# Patient Record
Sex: Female | Born: 1980 | Race: Black or African American | Hispanic: No | Marital: Single | Smoking: Former smoker
Health system: Southern US, Community
[De-identification: ages and names within clinical notes are randomized; demographics above are authoritative.]

## PROBLEM LIST (undated history)

## (undated) ENCOUNTER — Inpatient Hospital Stay (HOSPITAL_COMMUNITY): Payer: Self-pay

## (undated) DIAGNOSIS — F3181 Bipolar II disorder: Secondary | ICD-10-CM

## (undated) DIAGNOSIS — F32A Depression, unspecified: Secondary | ICD-10-CM

## (undated) DIAGNOSIS — F329 Major depressive disorder, single episode, unspecified: Secondary | ICD-10-CM

## (undated) DIAGNOSIS — K589 Irritable bowel syndrome without diarrhea: Secondary | ICD-10-CM

## (undated) DIAGNOSIS — I10 Essential (primary) hypertension: Secondary | ICD-10-CM

## (undated) HISTORY — DX: Bipolar II disorder: F31.81

## (undated) HISTORY — PX: CHOLECYSTECTOMY: SHX55

## (undated) HISTORY — DX: Irritable bowel syndrome, unspecified: K58.9

## (undated) HISTORY — PX: MOUTH SURGERY: SHX715

---
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
## 2002-10-22 ENCOUNTER — Emergency Department (HOSPITAL_COMMUNITY): Admission: EM | Admit: 2002-10-22 | Discharge: 2002-10-22 | Payer: Self-pay | Admitting: Emergency Medicine

## 2002-11-22 ENCOUNTER — Emergency Department (HOSPITAL_COMMUNITY): Admission: EM | Admit: 2002-11-22 | Discharge: 2002-11-22 | Payer: Self-pay | Admitting: Emergency Medicine

## 2003-01-30 ENCOUNTER — Emergency Department (HOSPITAL_COMMUNITY): Admission: EM | Admit: 2003-01-30 | Discharge: 2003-01-30 | Payer: Self-pay | Admitting: Emergency Medicine

## 2003-02-14 ENCOUNTER — Emergency Department (HOSPITAL_COMMUNITY): Admission: EM | Admit: 2003-02-14 | Discharge: 2003-02-14 | Payer: Self-pay | Admitting: Emergency Medicine

## 2004-02-22 ENCOUNTER — Emergency Department (HOSPITAL_COMMUNITY): Admission: EM | Admit: 2004-02-22 | Discharge: 2004-02-22 | Payer: Self-pay | Admitting: Family Medicine

## 2004-03-04 ENCOUNTER — Emergency Department (HOSPITAL_COMMUNITY): Admission: EM | Admit: 2004-03-04 | Discharge: 2004-03-04 | Payer: Self-pay | Admitting: Family Medicine

## 2004-03-07 ENCOUNTER — Emergency Department (HOSPITAL_COMMUNITY): Admission: EM | Admit: 2004-03-07 | Discharge: 2004-03-07 | Payer: Self-pay | Admitting: Family Medicine

## 2005-02-04 ENCOUNTER — Emergency Department (HOSPITAL_COMMUNITY): Admission: EM | Admit: 2005-02-04 | Discharge: 2005-02-04 | Payer: Self-pay | Admitting: Emergency Medicine

## 2005-02-06 ENCOUNTER — Emergency Department (HOSPITAL_COMMUNITY): Admission: EM | Admit: 2005-02-06 | Discharge: 2005-02-06 | Payer: Self-pay | Admitting: Family Medicine

## 2005-02-16 ENCOUNTER — Emergency Department (HOSPITAL_COMMUNITY): Admission: EM | Admit: 2005-02-16 | Discharge: 2005-02-16 | Payer: Self-pay | Admitting: Family Medicine

## 2005-02-19 ENCOUNTER — Emergency Department (HOSPITAL_COMMUNITY): Admission: EM | Admit: 2005-02-19 | Discharge: 2005-02-19 | Payer: Self-pay | Admitting: Family Medicine

## 2005-04-05 ENCOUNTER — Emergency Department (HOSPITAL_COMMUNITY): Admission: EM | Admit: 2005-04-05 | Discharge: 2005-04-05 | Payer: Self-pay | Admitting: Emergency Medicine

## 2005-07-21 ENCOUNTER — Emergency Department (HOSPITAL_COMMUNITY): Admission: EM | Admit: 2005-07-21 | Discharge: 2005-07-21 | Payer: Self-pay | Admitting: Internal Medicine

## 2006-05-21 ENCOUNTER — Inpatient Hospital Stay (HOSPITAL_COMMUNITY): Admission: AD | Admit: 2006-05-21 | Discharge: 2006-05-21 | Payer: Self-pay | Admitting: Gynecology

## 2006-07-02 ENCOUNTER — Inpatient Hospital Stay (HOSPITAL_COMMUNITY): Admission: AD | Admit: 2006-07-02 | Discharge: 2006-07-02 | Payer: Self-pay | Admitting: Obstetrics and Gynecology

## 2006-12-30 ENCOUNTER — Inpatient Hospital Stay (HOSPITAL_COMMUNITY): Admission: AD | Admit: 2006-12-30 | Discharge: 2006-12-30 | Payer: Self-pay | Admitting: Obstetrics and Gynecology

## 2007-01-01 ENCOUNTER — Inpatient Hospital Stay (HOSPITAL_COMMUNITY): Admission: AD | Admit: 2007-01-01 | Discharge: 2007-01-03 | Payer: Self-pay | Admitting: Obstetrics and Gynecology

## 2007-05-28 ENCOUNTER — Emergency Department (HOSPITAL_COMMUNITY): Admission: EM | Admit: 2007-05-28 | Discharge: 2007-05-28 | Payer: Self-pay | Admitting: Emergency Medicine

## 2008-08-16 ENCOUNTER — Encounter: Admission: RE | Admit: 2008-08-16 | Discharge: 2008-08-16 | Payer: Self-pay | Admitting: Family Medicine

## 2010-07-31 ENCOUNTER — Emergency Department (HOSPITAL_COMMUNITY): Admission: EM | Admit: 2010-07-31 | Discharge: 2010-07-31 | Payer: Self-pay | Admitting: Family Medicine

## 2010-10-13 ENCOUNTER — Emergency Department (HOSPITAL_COMMUNITY)
Admission: EM | Admit: 2010-10-13 | Discharge: 2010-10-13 | Payer: Self-pay | Source: Home / Self Care | Admitting: Emergency Medicine

## 2010-11-17 ENCOUNTER — Encounter: Payer: Self-pay | Admitting: Gynecology

## 2010-12-01 ENCOUNTER — Emergency Department (HOSPITAL_COMMUNITY)
Admission: EM | Admit: 2010-12-01 | Discharge: 2010-12-01 | Disposition: A | Discharge status: Home / Self Care | Payer: Medicaid Other | Attending: Emergency Medicine | Admitting: Emergency Medicine

## 2010-12-01 DIAGNOSIS — R109 Unspecified abdominal pain: Secondary | ICD-10-CM | POA: Insufficient documentation

## 2010-12-01 DIAGNOSIS — R10812 Left upper quadrant abdominal tenderness: Secondary | ICD-10-CM | POA: Insufficient documentation

## 2010-12-01 DIAGNOSIS — R112 Nausea with vomiting, unspecified: Secondary | ICD-10-CM | POA: Insufficient documentation

## 2010-12-01 DIAGNOSIS — R197 Diarrhea, unspecified: Secondary | ICD-10-CM | POA: Insufficient documentation

## 2010-12-01 LAB — DIFFERENTIAL
Basophils Relative: 0 % (ref 0–1)
Eosinophils Absolute: 0.1 10*3/uL (ref 0.0–0.7)
Lymphocytes Relative: 10 % — ABNORMAL LOW (ref 12–46)
Lymphs Abs: 0.9 10*3/uL (ref 0.7–4.0)
Monocytes Absolute: 0.7 10*3/uL (ref 0.1–1.0)
Neutro Abs: 7.7 10*3/uL (ref 1.7–7.7)
Neutrophils Relative %: 82 % — ABNORMAL HIGH (ref 43–77)

## 2010-12-01 LAB — COMPREHENSIVE METABOLIC PANEL
AST: 23 U/L (ref 0–37)
BUN: 10 mg/dL (ref 6–23)
Calcium: 9 mg/dL (ref 8.4–10.5)
Sodium: 137 mEq/L (ref 135–145)

## 2010-12-01 LAB — URINALYSIS, ROUTINE W REFLEX MICROSCOPIC
Bilirubin Urine: NEGATIVE
Ketones, ur: NEGATIVE mg/dL
Protein, ur: NEGATIVE mg/dL
Urine Glucose, Fasting: NEGATIVE mg/dL
Urobilinogen, UA: 1 mg/dL (ref 0.0–1.0)

## 2010-12-01 LAB — CBC
Hemoglobin: 15.4 g/dL — ABNORMAL HIGH (ref 12.0–15.0)
MCH: 26.4 pg (ref 26.0–34.0)
MCHC: 36.2 g/dL — ABNORMAL HIGH (ref 30.0–36.0)
MCV: 73.1 fL — ABNORMAL LOW (ref 78.0–100.0)
Platelets: 192 10*3/uL (ref 150–400)
RBC: 5.83 MIL/uL — ABNORMAL HIGH (ref 3.87–5.11)
RDW: 13.4 % (ref 11.5–15.5)

## 2010-12-01 LAB — POCT I-STAT, CHEM 8
Calcium, Ion: 1.14 mmol/L (ref 1.12–1.32)
Chloride: 102 mEq/L (ref 96–112)
Glucose, Bld: 85 mg/dL (ref 70–99)

## 2010-12-01 LAB — URINE MICROSCOPIC-ADD ON

## 2011-06-25 ENCOUNTER — Inpatient Hospital Stay (INDEPENDENT_AMBULATORY_CARE_PROVIDER_SITE_OTHER)
Admission: RE | Admit: 2011-06-25 | Discharge: 2011-06-25 | Disposition: A | Discharge status: Home / Self Care | Payer: Self-pay | Source: Ambulatory Visit | Attending: Emergency Medicine | Admitting: Emergency Medicine

## 2011-06-25 DIAGNOSIS — J069 Acute upper respiratory infection, unspecified: Secondary | ICD-10-CM

## 2011-06-25 DIAGNOSIS — J40 Bronchitis, not specified as acute or chronic: Secondary | ICD-10-CM

## 2011-06-25 DIAGNOSIS — J019 Acute sinusitis, unspecified: Secondary | ICD-10-CM

## 2011-08-11 LAB — I-STAT 8, (EC8 V) (CONVERTED LAB)
Chloride: 105
Glucose, Bld: 83
Potassium: 4
TCO2: 28
pCO2, Ven: 55.3 — ABNORMAL HIGH
pH, Ven: 7.283

## 2011-08-11 LAB — DIFFERENTIAL
Basophils Absolute: 0
Basophils Relative: 0
Eosinophils Absolute: 0.3
Lymphocytes Relative: 39
Monocytes Relative: 8
Neutro Abs: 3.3
Neutrophils Relative %: 48

## 2011-08-11 LAB — CBC
Hemoglobin: 13.5
MCHC: 33.8
MCV: 74.3 — ABNORMAL LOW
RBC: 5.38 — ABNORMAL HIGH

## 2011-08-11 LAB — D-DIMER, QUANTITATIVE: D-Dimer, Quant: 0.51 — ABNORMAL HIGH

## 2011-08-11 LAB — HEPATIC FUNCTION PANEL
Alkaline Phosphatase: 55
Bilirubin, Direct: <0.1

## 2011-08-11 LAB — LIPASE, BLOOD: Lipase: 15

## 2011-08-11 LAB — POCT I-STAT CREATININE: Operator id: 234501

## 2012-01-28 ENCOUNTER — Encounter (HOSPITAL_COMMUNITY): Payer: Self-pay | Admitting: *Deleted

## 2012-01-28 ENCOUNTER — Emergency Department (HOSPITAL_COMMUNITY)
Admission: EM | Admit: 2012-01-28 | Discharge: 2012-01-29 | Disposition: A | Discharge status: Home / Self Care | Payer: Medicaid Other | Attending: Emergency Medicine | Admitting: Emergency Medicine

## 2012-01-28 DIAGNOSIS — F1010 Alcohol abuse, uncomplicated: Secondary | ICD-10-CM | POA: Insufficient documentation

## 2012-01-28 DIAGNOSIS — F3289 Other specified depressive episodes: Secondary | ICD-10-CM | POA: Insufficient documentation

## 2012-01-28 DIAGNOSIS — F329 Major depressive disorder, single episode, unspecified: Secondary | ICD-10-CM | POA: Insufficient documentation

## 2012-01-28 DIAGNOSIS — R45851 Suicidal ideations: Secondary | ICD-10-CM | POA: Insufficient documentation

## 2012-01-28 DIAGNOSIS — F17200 Nicotine dependence, unspecified, uncomplicated: Secondary | ICD-10-CM | POA: Insufficient documentation

## 2012-01-28 HISTORY — DX: Major depressive disorder, single episode, unspecified: F32.9

## 2012-01-28 HISTORY — DX: Depression, unspecified: F32.A

## 2012-01-28 LAB — CBC
Hemoglobin: 14.6 g/dL (ref 12.0–15.0)
Platelets: 246 10*3/uL (ref 150–400)
RDW: 13.1 % (ref 11.5–15.5)

## 2012-01-28 LAB — ETHANOL: Alcohol, Ethyl (B): <11 mg/dL (ref 0–11)

## 2012-01-28 LAB — COMPREHENSIVE METABOLIC PANEL
ALT: 16 U/L (ref 0–35)
AST: 19 U/L (ref 0–37)
BUN: 6 mg/dL (ref 6–23)
CO2: 28 mEq/L (ref 19–32)
Chloride: 102 mEq/L (ref 96–112)
GFR calc Af Amer: >90 mL/min (ref >90)
Glucose, Bld: 107 mg/dL — ABNORMAL HIGH (ref 70–99)

## 2012-01-28 LAB — RAPID URINE DRUG SCREEN, HOSP PERFORMED
Amphetamines: NOT DETECTED
Benzodiazepines: NOT DETECTED
Opiates: NOT DETECTED

## 2012-01-28 MED ORDER — IBUPROFEN 600 MG PO TABS
600.0000 mg | ORAL_TABLET | Freq: Three times a day (TID) | ORAL | Status: DC | PRN
  Administered 2012-01-28: 600 mg via ORAL
  Filled 2012-01-28: qty 1

## 2012-01-28 MED ORDER — LORAZEPAM 1 MG PO TABS: 1.0000 mg | ORAL_TABLET | Freq: Three times a day (TID) | ORAL | Status: DC | PRN

## 2012-01-28 MED ORDER — ONDANSETRON HCL 4 MG PO TABS
4.0000 mg | ORAL_TABLET | Freq: Three times a day (TID) | ORAL | Status: DC | PRN
Start: 2012-01-28 — End: 2012-01-29

## 2012-01-28 MED ORDER — ACETAMINOPHEN 325 MG PO TABS: 650.0000 mg | ORAL_TABLET | ORAL | Status: DC | PRN

## 2012-01-28 MED ORDER — ZOLPIDEM TARTRATE 5 MG PO TABS: 5.0000 mg | ORAL_TABLET | Freq: Every evening | ORAL | Status: DC | PRN

## 2012-01-28 MED ORDER — ALUM & MAG HYDROXIDE-SIMETH 200-200-20 MG/5ML PO SUSP: 30.0000 mL | ORAL | Status: DC | PRN

## 2012-01-28 MED ORDER — CITALOPRAM HYDROBROMIDE 20 MG PO TABS: 20.0000 mg | ORAL_TABLET | Freq: Every day | ORAL | Status: DC

## 2012-01-28 NOTE — ED Notes (Signed)
Pt belongings are in locker 4  
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
## 2012-01-28 NOTE — ED Notes (Signed)
Pt. Inventory: 1 pair of socks, 1 chapstick, 1 pair of shoes, 1 pair of jeans, 1 gray shirt, 1 black bra, 1 gray sweatshirt, 1 cell phone, and drivers license.
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
## 2012-01-28 NOTE — ED Provider Notes (Addendum)
History     CSN: [id number]  Arrival date & time 01/28/12  1705   First MD Initiated Contact with Patient 01/28/12 1803      Chief Complaint  Patient presents with  . Depression  . Medical Clearance    (Consider location/radiation/quality/duration/timing/severity/associated sxs/prior treatment) Patient is a 31 y.o. female presenting with mental health disorder. The history is provided by the patient. No language interpreter was used.  Mental Health Problem The primary symptoms do not include dysphoric mood or hallucinations. The current episode started 1 to 2 weeks ago. This is a new problem.  The onset of the illness is precipitated by a stressful event and emotional stress. The degree of incapacity that she is experiencing as a consequence of her illness is moderate. Sequelae of the illness include an inability to work. Additional symptoms of the illness do not include no appetite change, no fatigue, no headaches or no abdominal pain. She admits to suicidal ideas. She does not have a plan to commit suicide. She contemplates harming herself. She has not already injured self. She contemplates injuring another person. She has not already  injured another person. Risk factors that are present for mental illness include a history of mental illness.    Past Medical History  Diagnosis Date  . Depression     Past Surgical History  Procedure Date  . Cholecystectomy     No family history on file.  History  Substance Use Topics  . Smoking status: Current Everyday Smoker  . Smokeless tobacco: Not on file  . Alcohol Use: Yes     "at least a 22 oz beer everyday"    OB History    Grav Para Term Preterm Abortions TAB SAB Ect Mult Living                  Review of Systems  Constitutional: Negative for fever, chills, activity change, appetite change and fatigue.  HENT: Negative for congestion, sore throat, rhinorrhea, neck pain and neck stiffness.   Respiratory: Negative for cough  and shortness of breath.   Cardiovascular: Negative for chest pain and palpitations.  Gastrointestinal: Negative for nausea, vomiting and abdominal pain.  Genitourinary: Negative for dysuria, urgency, frequency and flank pain.  Musculoskeletal: Negative for myalgias, back pain and arthralgias.  Neurological: Negative for dizziness, weakness, light-headedness, numbness and headaches.  Psychiatric/Behavioral: Positive for suicidal ideas. Negative for hallucinations, sleep disturbance and dysphoric mood. The patient is not nervous/anxious.   All other systems reviewed and are negative.    Allergies  Review of patient's allergies indicates no known allergies.  Home Medications   Current Outpatient Rx  Name Route Sig Dispense Refill  . BC HEADACHE POWDER PO Oral Take 1 Package by mouth once.    Marland Kitchen LEVONORGESTREL 20 MCG/24HR IU IUD Intrauterine 1 each by Intrauterine route once.    Marland Kitchen METRONIDAZOLE 0.75 % VA GEL Vaginal Place 1 Applicatorful vaginally 2 (two) times daily.      BP 135/97  Pulse 88  Temp(Src) 98.7 F (37.1 C) (Oral)  Resp 16  SpO2 100%  Physical Exam  Nursing note and vitals reviewed. Constitutional: She is oriented to person, place, and time. She appears well-developed and well-nourished.       tearful  HENT:  Head: Normocephalic and atraumatic.  Mouth/Throat: Oropharynx is clear and moist. No oropharyngeal exudate.  Eyes: Conjunctivae and EOM are normal. Pupils are equal, round, and reactive to light.  Neck: Normal range of motion. Neck supple.  Cardiovascular: Normal  rate, regular rhythm, normal heart sounds and intact distal pulses.  Exam reveals no gallop and no friction rub.   No murmur heard. Pulmonary/Chest: Effort normal and breath sounds normal.  Abdominal: Soft. Bowel sounds are normal. There is no tenderness.  Musculoskeletal: Normal range of motion. She exhibits no tenderness.  Neurological: She is alert and oriented to person, place, and time. No  cranial nerve deficit.  Skin: Skin is warm and dry. No rash noted.  Psychiatric: Her speech is normal. She is slowed. She is not withdrawn and not actively hallucinating. Thought content is not paranoid and not delusional. She exhibits a depressed mood. She expresses homicidal and suicidal ideation. She expresses no suicidal plans and no homicidal plans.    ED Course  Procedures (including critical care time)  Labs Reviewed  CBC - Abnormal; Notable for the following:    RBC 5.60 (*)    MCV 75.5 (*)    All other components within normal limits  COMPREHENSIVE METABOLIC PANEL - Abnormal; Notable for the following:    Glucose, Bld 107 (*)    All other components within normal limits  URINE RAPID DRUG SCREEN (HOSP PERFORMED) - Abnormal; Notable for the following:    Cocaine POSITIVE (*)    Tetrahydrocannabinol POSITIVE (*)    All other components within normal limits  ETHANOL  POCT PREGNANCY, URINE   No results found.   1. Depression   2. Suicidal ideations       MDM  Patient is medically clear for psychiatric evaluation. Increased depression and suicidal and homicidal thoughts. No specific plans. Discussed with the ACT team. She will likely require a specialist on call consult. Psych orders were placed. She has no medications that need ordered. Evaluated by Dr. Juluis Pitch with specialist on call. States the patient is stable for outpatient psychiatric treatment. Recommends referral for the services. Recommends referral for outpatient alcohol rehabilitation. Recommend Celexa 20 mg daily. Will be placed on this. Be provided resource guide       Dayton Bailiff, MD 01/28/12 1830  Dayton Bailiff, MD 01/28/12 2342

## 2012-01-28 NOTE — ED Notes (Signed)
Pt. is in blue scrubs and red socks

## 2012-01-28 NOTE — ED Notes (Signed)
Pt. has 1 bag of belongings. Pt. and belongings have both been wanded by security.

## 2012-01-28 NOTE — ED Notes (Signed)
Pt reports Hx of depression, sts she wants to die, has attempted suicide in the past. Wants help because she feels her mood swings have worsened and feels she can't control things anymore. Sts she doesn't want it to get to the point that she wants to hurt anyone else. Denies HI at this time, denies [facility]. Sts when she is alone, her mind/thoughts start racing and depression worsens. Sts hx of mulitple suicide attempts, most recent in January when she took a lot of sleeping pills, was not seen after this.

## 2012-01-28 NOTE — Discharge Instructions (Signed)
Depression, Adolescent and Adult Depression is a true and treatable medical condition. In general there are two kinds of depression:  Depression we all experience in some form. For example depression from the death of a loved one, financial distress or natural disasters will trigger or increase depression.   Clinical depression, on the other hand, appears without an apparent cause or reason. This depression is a disease. Depression may be caused by chemical imbalance in the body and brain or may come as a response to a physical illness. Alcohol and other drugs can cause depression.  DIAGNOSIS  The diagnosis of depression is usually based upon symptoms and medical history. TREATMENT  Treatments for depression fall into three categories. These are:  Drug therapy. There are many medicines that treat depression. Responses may vary and sometimes trial and error is necessary to determine the best medicines and dosage for a particular patient.   Psychotherapy, also called talking treatments, helps people resolve their problems by looking at them from a different point of view and by giving people insight into their own personal makeup. Traditional psychotherapy looks at a childhood source of a problem. Other psychotherapy will look at current conflicts and move toward solving those. If the cause of depression is drug use, counseling is available to help abstain. In time the depression will usually improve. If there were underlying causes for the chemical use, they can be addressed.   ECT (electroconvulsive therapy) or shock treatment is not as commonly used today. It is a very effective treatment for severe suicidal depression. During ECT electrical impulses are applied to the head. These impulses cause a generalized seizure. It can be effective but causes a loss of memory for recent events. Sometimes this loss of memory may include the last several months.  Treat all depression or suicide threats as  serious. Obtain professional help. Do not wait to see if serious depression will get better over time without help. Seek help for yourself or those around you. In the [place] the number to the National Suicide Help Lines With 24 Hour Help Are: 1-800-SUICIDE 1-800-784-2433 Document Released: 10/10/2000 Document Revised: 10/02/2011 Document Reviewed: 05/31/2008 ExitCare Patient Information 2012 [employer] RESOURCE GUIDE  Dental Problems  Patients with Medicaid: [facility]                     [facility] [address]                                           [address] Phone:  [phone]                                                  Phone:  [phone]  If unable to pay or uninsured, contact:  [facility] or [facility] to become qualified for the adult dental clinic.  Chronic Pain Problems Contact [facility]  [phone] Patients need to be referred by their primary care doctor.  Insufficient Money for Medicine Contact [employer]:  call "211" or [facility] 271-5999.  No Primary Care Doctor Call [facility]  832-8000 Other agencies that provide inexpensive medical care    [facility]    832-8035    [facility]  832-7272    [facility]  [phone]    Women[facility] Clinic  832-4777    [facility]  373-0678    [facility]  272-1050  Psychological Services [facility]  832-9600 [facility]  [phone] [facility]   800 853-5163 (emergency services 641-4993)  Substance Abuse Resources [facility]  336-882-2125 [facility] 336-784-9470 [facility] 336-285-9073 [facility] 336-845-3988 Residential & Outpatient Substance Abuse Program  800-659-3381  Abuse/Neglect [place] Child Abuse Hotline (336) 641-3795 [place] Child Abuse Hotline 800-378-5315 (After  Hours)  Emergency Shelter [facility] (336) 271-5985  [facility] (336) 275-9566 [facility] (704) 372-4663  MRSA Hotline #:   832-7006    [place] [facility]  [facility] [place]     [employer]                          [facility] [address] [place]                       [address]      [address]  [place]                                                [place]                            [place] Phone:  [phone]                                   Phone:  [phone]                 Phone:  [phone]  [facility] Phone:  342-8316  [place] Child Abuse Hotline (336) 342-1394 (336) 342-3537 (After Hours)   

## 2012-01-28 NOTE — ED Notes (Signed)
Pt. has 1 visitor in triage -- security scanned prior to entering room.

## 2012-01-29 NOTE — ED Notes (Signed)
Per telepsych pt to be discharged and follow up OP per her request. Discharge instructions reviewed with pt, who verbalizes understanding. Pt contracts for safety and departs unit in safe and stable condition.

## 2013-02-17 ENCOUNTER — Telehealth: Payer: Self-pay | Admitting: Family Medicine

## 2013-02-17 MED ORDER — ATORVASTATIN CALCIUM 10 MG PO TABS: 10.0000 mg | ORAL_TABLET | Freq: Every day | ORAL | Status: DC

## 2013-02-17 NOTE — Telephone Encounter (Signed)
Atorvastatin 40 mg take 1 tablet by mouth once daily.Marland Kitchenlast refill 01/13/13 last office visit 12/03/12.Marland KitchenFYI Dr.Wong pt.

## 2013-02-17 NOTE — Telephone Encounter (Signed)
Medication refilled per protocol. 

## 2013-03-01 ENCOUNTER — Encounter: Payer: Self-pay | Admitting: Physician Assistant

## 2013-03-02 NOTE — Telephone Encounter (Signed)
This encounter was created in error - please disregard.

## 2013-03-27 ENCOUNTER — Emergency Department (HOSPITAL_COMMUNITY): Payer: Self-pay

## 2013-03-27 ENCOUNTER — Emergency Department (HOSPITAL_COMMUNITY)
Admission: EM | Admit: 2013-03-27 | Discharge: 2013-03-27 | Disposition: A | Discharge status: Home / Self Care | Payer: Self-pay | Attending: Emergency Medicine | Admitting: Emergency Medicine

## 2013-03-27 ENCOUNTER — Encounter (HOSPITAL_COMMUNITY): Payer: Self-pay | Admitting: Emergency Medicine

## 2013-03-27 DIAGNOSIS — J029 Acute pharyngitis, unspecified: Secondary | ICD-10-CM | POA: Insufficient documentation

## 2013-03-27 DIAGNOSIS — J3489 Other specified disorders of nose and nasal sinuses: Secondary | ICD-10-CM | POA: Insufficient documentation

## 2013-03-27 DIAGNOSIS — F17200 Nicotine dependence, unspecified, uncomplicated: Secondary | ICD-10-CM | POA: Insufficient documentation

## 2013-03-27 DIAGNOSIS — R000 Tachycardia, unspecified: Secondary | ICD-10-CM | POA: Insufficient documentation

## 2013-03-27 DIAGNOSIS — F3289 Other specified depressive episodes: Secondary | ICD-10-CM | POA: Insufficient documentation

## 2013-03-27 DIAGNOSIS — F329 Major depressive disorder, single episode, unspecified: Secondary | ICD-10-CM | POA: Insufficient documentation

## 2013-03-27 DIAGNOSIS — H9209 Otalgia, unspecified ear: Secondary | ICD-10-CM | POA: Insufficient documentation

## 2013-03-27 DIAGNOSIS — Z79899 Other long term (current) drug therapy: Secondary | ICD-10-CM | POA: Insufficient documentation

## 2013-03-27 DIAGNOSIS — R059 Cough, unspecified: Secondary | ICD-10-CM | POA: Insufficient documentation

## 2013-03-27 DIAGNOSIS — R05 Cough: Secondary | ICD-10-CM | POA: Insufficient documentation

## 2013-03-27 DIAGNOSIS — J069 Acute upper respiratory infection, unspecified: Secondary | ICD-10-CM | POA: Insufficient documentation

## 2013-03-27 DIAGNOSIS — K219 Gastro-esophageal reflux disease without esophagitis: Secondary | ICD-10-CM | POA: Insufficient documentation

## 2013-03-27 DIAGNOSIS — R6883 Chills (without fever): Secondary | ICD-10-CM | POA: Insufficient documentation

## 2013-03-27 MED ORDER — FLUTICASONE PROPIONATE 50 MCG/ACT NA SUSP: 2.0000 | Freq: Every day | NASAL | Status: DC

## 2013-03-27 MED ORDER — PENICILLIN G BENZATHINE 1200000 UNIT/2ML IM SUSP
1.2000 10*6.[IU] | Freq: Once | INTRAMUSCULAR | Status: AC
  Administered 2013-03-27: 1.2 10*6.[IU] via INTRAMUSCULAR
  Filled 2013-03-27: qty 2

## 2013-03-27 MED ORDER — GUAIFENESIN ER 600 MG PO TB12: 1200.0000 mg | ORAL_TABLET | Freq: Two times a day (BID) | ORAL | Status: DC

## 2013-03-27 MED ORDER — HYDROCODONE-HOMATROPINE 5-1.5 MG/5ML PO SYRP: 5.0000 mL | ORAL_SOLUTION | Freq: Four times a day (QID) | ORAL | Status: DC | PRN

## 2013-03-27 MED ORDER — OXYMETAZOLINE HCL 0.05 % NA SOLN
1.0000 | Freq: Once | NASAL | Status: AC
  Administered 2013-03-27: 1 via NASAL
  Filled 2013-03-27: qty 15

## 2013-03-27 NOTE — Discharge Instructions (Signed)
Read the instructions below on reasons to return to the emergency department and to learn more about your diagnosis.  Use over the counter medications for symptomatic relief as we discussed (musinex as a decongestant, Tylenol for fever/pain, Motrin/Ibuprofen for muscle aches). If prescribed a cough suppressant during your visit, do not operate heavy machinery with in 5 hours of taking this medication. Followup with your primary care doctor in 4 days if your symptoms persist.  Your more than welcome to return to the emergency department if symptoms worsen or become concerning.  Upper Respiratory Infection, Adult  An upper respiratory infection (URI) is also sometimes known as the common cold. Most people improve within 1 week, but symptoms can last up to 2 weeks. A residual cough may last even longer.   URI is most commonly caused by a virus. Viruses are NOT treated with antibiotics. You can easily spread the virus to others by oral contact. This includes kissing, sharing a glass, coughing, or sneezing. Touching your mouth or nose and then touching a surface, which is then touched by another person, can also spread the virus.   TREATMENT  Treatment is directed at relieving symptoms. There is no cure. Antibiotics are not effective, because the infection is caused by a virus, not by bacteria. Treatment may include:  Increased fluid intake. Sports drinks offer valuable electrolytes, sugars, and fluids.  Breathing heated mist or steam (vaporizer or shower).  Eating chicken soup or other clear broths, and maintaining good nutrition.  Getting plenty of rest.  Using gargles or lozenges for comfort.  Controlling fevers with ibuprofen or acetaminophen as directed by your caregiver.  Increasing usage of your inhaler if you have asthma.  Return to work when your temperature has returned to normal.   SEEK MEDICAL CARE IF:  After the first few days, you feel you are getting worse rather than better.  You  develop worsening shortness of breath, or brown or red sputum. These may be signs of pneumonia.  You develop yellow or brown nasal discharge or pain in the face, especially when you bend forward. These may be signs of sinusitis.  You develop a fever, swollen neck glands, pain with swallowing, or white areas in the back of your throat. These may be signs of strep throat.    RESOURCE GUIDE  Chronic Pain Problems: Contact [facility]  [phone] Patients need to be referred by their primary care doctor.  Insufficient Money for Medicine: Contact United Way:  call "211."   No Primary Care Doctor: - Call Health Connect  [phone] - can help you locate a primary care doctor that  accepts your insurance, provides certain services, etc. - Physician Referral Service- [phone]  Agencies that provide inexpensive medical care: - [facility]  [phone] - [facility]  [phone] - [facility]  [phone] - Women's Clinic  [phone] - Planned Parenthood  [phone] [facility]  [phone]  Medicaid-accepting [facility] Providers: - [facility]- [address]  [phone], Mon-Fri 9am-7pm, Sat 9am-1pm - [facility]- [address] [place], Suite [place]  [phone] - [facility]- [address] [place]  [phone] [facility]- [address]  [phone] - Renaye Rakers- [address] [place], [address], [phone]  Only accepts [place] patients after they have their name  applied to their card  Self Pay (no insurance) in  [place]: - Sickle Cell Patients - [facility] Medicine  [address] [place], [phone] - [facility] Urgent Care- [address] [place]  [phone]       Patrcia D[facility] [place]- [address]       -     [facility]- see information above (Speak to [employer] if you do not have insurance)       -  [facility]- [address],  [phone]       -  [facility]- [address], [phone]       -  Dr Julio Sicks-  [address], [place], [phone]       -  [facility] - [address] [phone]       -  [facility]- [address] [phone], also [address] [phone]       -     [facility]- [address] [place], [phone], 1st & 3rd Saturday         every month, 10am-1pm  -     [facility] [place] [facility]   [address] [place], [place].   Phone:  [phone], Fax:  [phone]. Hours of Operation:  9 am - 6 pm, M-F.  -     [facility]   [address], [place]   Phone: [phone], Fax: [phone]. Hours of Operation:  8:30 am - 5:30 pm, M-F.  [facility] [address] [place], [place] [address] [phone]  [facility] [address] [place], [place] [address] [phone]  1) Find a Doctor and Pay Out of Pocket Although you won't have to find out who is covered by your insurance plan, it is a good idea to ask around and get recommendations. You will then need to call the office and see if the doctor you have chosen will accept you as a new patient and what types of options they offer for patients who are self-pay. Some doctors offer discounts or will set up payment plans for their patients who do not have insurance, but you will need to ask so you aren't surprised when you get to your appointment.  2) Contact Your Local Health Department Not all health departments have doctors that can see patients for sick visits, but many do, so it is worth a call to see if yours does. If you don't know where your local health department is, you can check in your phone book. The CDC also has a tool to help you locate your state's health department, and many state websites also  have listings of all of their local health departments.  3) Find a Walk-in Clinic If your illness is not likely to be very severe or complicated, you may want to try a walk in clinic. These are popping up all over the country in pharmacies, drugstores, and shopping centers. They're usually staffed by nurse practitioners or physician assistants that have been trained to treat common illnesses and complaints. They're usually fairly quick and inexpensive. However, if you have serious medical issues or chronic medical problems, these are probably not your best option  STD Testing - [facility] of [facility] [place], STD Clinic, [address] [place], phone [phone] or [phone].  Monday - Friday, call  for an appointment. [facility] of [employer], STD Clinic, [place] [address], [place], phone [phone] or [phone].  Monday - Friday, call for an appointment.  Abuse/Neglect: [facility] Child Abuse Hotline [phone] [facility] Child Abuse Hotline 4073115840 (After Hours)  Emergency Shelter:  [facility] [phone]  Maternity Homes: - Room at the [place] [facility] [phone] - [facility] [phone]  MRSA Hotline #:   (323)136-3554  Dental Assistance If unable to pay or uninsured, contact:  [facility]. to become qualified for the adult dental clinic.  Patients with Medicaid: [facility] [phone] [address], [phone] [address], [phone]  If unable to pay, or uninsured, contact [facility] [phone] in [place], [phone] in [facility]) to become qualified for the adult dental clinic  [facility] [address] [place], [place] [address] [phone] [email or web address]  Other Proofreader Services: - [facility]- [address] [place], [place], [place], [address],  [phone], Ext. [phone], 2nd and 4th Thursday of the month at 6:30am.  10 clients each day by appointment, can sometimes see walk-in patients if someone does not show for an appointment. [facility]- [address] [place], [place], [address], [phone] - [facility] [address] [place], [place], [address], [phone] - [facility]- [phone] [facility] Health Department- [phone] [facility] Health [phone]       Behavioral Health Resources in the [facility]  Intensive Outpatient Programs: [facility]      [address] [place], [place] [phone] Both a day and evening program       [facility] Outpatient     [address]        [place], [place] [address] [phone]         ADS: [facility] [address] [place] [phone]  [facility] Mental Health ACCESS LINE: [phone] or [phone] [address] [place], [place] [address] [email or web address]   Substance Abuse Resources: - [facility]  [phone] - [facility] [phone] - The [place] [phone] Floydene Flock [phone] - Residential & Outpatient Substance Abuse Program  [phone]  Psychological Services: [facility]  [phone] [facility]  [phone] - [facility], [phone] [place]. [address], [place], ACCESS LINE: 4583248800 or [phone], [email or web address]  Mobile Crisis Teams:                                        Therapeutic Alternatives         Mobile Crisis Care Unit [phone]             [facility] [address] Ginette Otto [phone]                                         Interventionist [address] [place]  [place] [phone]  Self-Help/Support Groups: Mental Health Assoc. of [employer] of support groups 2202728307 (call for more info)  Narcotics Anonymous (NA) [facility]aring [facility] [address] [place] - 2 meetings at this location  Residential Treatment Programs:  [facility]      [address]        [place]       [phone]         [facility] [address], [place] [address] [place], [place]  [address] [phone]  [facility]  [address] [place], [place] [address] [phone] Admissions: 8am-3pm M-F  [facility]     [address]        [place], [place] [address]       [phone]         [facility] [address] [place], [place] [phone]  [facility] [address] [place], [place] [phone]  [facility] - Intensive Outpatient      [address]     [place], [place]       [phone]         [facility] (Addiction Recovery [facility]     [address] [place], [place] [phone] or [phone]  Residential Treatment Services (RTS), Medicaid [address] [place], [place] [phone]  [facility] [address]                                               [address] [place] [phone]  [facility] Resources: [phone]               General Therapy                                                Angie Fava, PhD        [address] [place], [place] [address]         [phone]   Insurance  [facility]   [address] [place], [place] [address] [phone]  [facility] [address] [place], [place] [address] [phone] Insurance/Medicaid/sponsorship through [facility] and Families                                              [address]                                         [place], [place] [address]    Therapy/tele-psych/case         [phone]          [facility] [address], [place]  [address]  Adolescent/group home/case management [phone]                                           Creola Corn PhD  General therapy       Insurance   [phone]         Dr. Lolly Mustache, [place], M-F [phone]  [facility]  [employer] [facility] Dept. [address]                 [address]         [address] [place] [address]  Blondell Reveal Phone:  [phone]                                  Phone:  [phone]                   Phone:  [phone]  [facility] Mental Health, [phone] - [facility] - [facility]- [phone]       -     [facility] in [place], [address]             [phone], Insurance  [place] Child Abuse Hotline [phone] or 5152413568 (After Hours)

## 2013-03-27 NOTE — ED Notes (Signed)
Pt reports chills, productive cough and sore throat x 3 days. Pain not responsive to OTC meds

## 2013-03-27 NOTE — ED Provider Notes (Signed)
History  This chart was scribed for Dierdre Forth, PA-C working with Raeford Razor, MD by Ardelia Mems, ED Scribe. This patient was seen in room WTR8/WTR8 and the patient's care was started at 6:36 PM.   CSN: [id number]  Arrival date & time 03/27/13  1803     Chief Complaint  Patient presents with  . Sore Throat  . Cough     The history is provided by the patient. No language interpreter was used.    HPI Comments: Alicia Rosario is a 32 y.o. female with a h/o mouth surgery who presents to the Emergency Department complaining of a gradual onset, gradually worsening, constant, moderate sore throat of 3 days duration. Pt reports associated chills and a productive cough of thick green and yellow mucus. Pt states that he has taken Dayquil, Nyquil and Luden's without relief. Pt states that she initially thought she had a cold. Pt states that she has left ear pain when she swallows. Pt states that she takes medications for allergies and acid reflux. Pt denies being around persons infected with strep throat. Pt denies nausea, vomiting, diarrhea, fever or any other symptoms. Pt is an alcohol drinker and a current every day smoker.   Past Medical History  Diagnosis Date  . Depression     Past Surgical History  Procedure Laterality Date  . Cholecystectomy    . Mouth surgery      History reviewed. No pertinent family history.  History  Substance Use Topics  . Smoking status: Current Every Day Smoker  . Smokeless tobacco: Not on file  . Alcohol Use: Yes     Comment: "at least a 22 oz beer everyday"    OB History   Grav Para Term Preterm Abortions TAB SAB Ect Mult Living                  Review of Systems  Constitutional: Positive for chills. Negative for fever.  HENT: Positive for ear pain and sore throat.   Respiratory: Positive for cough.   Gastrointestinal: Negative for nausea, vomiting and diarrhea.  All other systems reviewed and are negative.    Allergies   Review of patient's allergies indicates no known allergies.  Home Medications   Current Outpatient Rx  Name  Route  Sig  Dispense  Refill  . atorvastatin (LIPITOR) 10 MG tablet   Oral   Take 1 tablet (10 mg total) by mouth at bedtime.   30 tablet   5   . loratadine (CLARITIN) 10 MG tablet   Oral   Take 10 mg by mouth daily.         . Pseudoephedrine-APAP-DM (DAYQUIL PO)   Oral   Take 1 capsule by mouth daily.         . ranitidine (ZANTAC) 150 MG tablet   Oral   Take 150 mg by mouth 2 (two) times daily.         Marland Kitchen EXPIRED: citalopram (CELEXA) 20 MG tablet   Oral   Take 1 tablet (20 mg total) by mouth daily.   30 tablet   0   . fluticasone (FLONASE) 50 MCG/ACT nasal spray   Nasal   Place 2 sprays into the nose daily.   16 g   2   . guaiFENesin (MUCINEX) 600 MG 12 hr tablet   Oral   Take 2 tablets (1,200 mg total) by mouth 2 (two) times daily.   30 tablet   0   . HYDROcodone-homatropine (HYCODAN) 5-1.5  MG/5ML syrup   Oral   Take 5 mLs by mouth every 6 (six) hours as needed for cough.   120 mL   0   . levonorgestrel (MIRENA) 20 MCG/24HR IUD   Intrauterine   1 each by Intrauterine route once.           Triage Vitals: BP 123/83  Pulse 103  Temp(Src) 98.9 F (37.2 C) (Oral)  Resp 16  Wt 228 lb (103.42 kg)  SpO2 99%  Physical Exam  Nursing note and vitals reviewed. Constitutional: She is oriented to person, place, and time. She appears well-developed and well-nourished. No distress.  HENT:  Head: Normocephalic and atraumatic.  Right Ear: Tympanic membrane, external ear and ear canal normal. Tympanic membrane is not erythematous and not bulging. No middle ear effusion.  Left Ear: External ear and ear canal normal. Tympanic membrane is erythematous. Tympanic membrane is not bulging.  No middle ear effusion.  Nose: Mucosal edema and rhinorrhea present. No epistaxis. Right sinus exhibits no maxillary sinus tenderness and no frontal sinus tenderness.  Left sinus exhibits no maxillary sinus tenderness and no frontal sinus tenderness.  Mouth/Throat: Uvula is midline and mucous membranes are normal. Mucous membranes are not pale and not cyanotic. Oropharyngeal exudate (There is only one very small spot on the right which appears to be a tonsilith as opposed to exudate), posterior oropharyngeal edema and posterior oropharyngeal erythema present. No tonsillar abscesses.  Left TM is mildly erythematous, no effusion, no bulging. Left canal clear. Right TM normal. Oropharyngeal edema is bilateral. Uvula midline.  Eyes: Conjunctivae are normal. Pupils are equal, round, and reactive to light.  Neck: Normal range of motion and full passive range of motion without pain.  Cardiovascular: Regular rhythm, S1 normal, S2 normal, normal heart sounds and intact distal pulses.  Tachycardia present.   No murmur heard. Pulses:      Radial pulses are 2+ on the right side, and 2+ on the left side.       Dorsalis pedis pulses are 2+ on the right side, and 2+ on the left side.       Posterior tibial pulses are 2+ on the right side, and 2+ on the left side.  Regular rate and rhythm.  Pulmonary/Chest: Effort normal and breath sounds normal. No stridor.  Clear and equal breath sounds.  Abdominal: Soft. Bowel sounds are normal. There is no tenderness.  Musculoskeletal: Normal range of motion.  Lymphadenopathy:    She has no cervical adenopathy.  Neurological: She is alert and oriented to person, place, and time. She exhibits normal muscle tone. Coordination normal.  Skin: Skin is warm and dry. No rash noted. She is not diaphoretic. No erythema.  Psychiatric: She has a normal mood and affect.    ED Course  Procedures (including critical care time)  DIAGNOSTIC STUDIES: Oxygen Saturation is 99% on RA, normal by my interpretation.    COORDINATION OF CARE: 7:04 PM- Pt advised of plan for treatment and pt agrees.     Labs Reviewed  RAPID STREP SCREEN   CULTURE, GROUP A STREP   Dg Chest 2 View  03/27/2013   *RADIOLOGY REPORT*  Clinical Data: Cough  CHEST - 2 VIEW  Comparison: CT 05/28/2007  Findings: Lateral view degraded by patient arm position.  Midline trachea.  Borderline cardiomegaly.     Mediastinal contours otherwise within normal limits.  No pleural effusion or pneumothorax.  Patchy bibasilar airspace disease.  IMPRESSION: Bibasilar airspace disease, likely atelectasis.  Borderline cardiomegaly, without  acute process.   Original Report Authenticated By: Jeronimo Greaves, M.D.     1. Viral URI with cough   2. Pharyngitis       MDM  [name] presents with hx and PE consistent with URI.  Pt CXR negative for acute infiltrate. I personally reviewed the imaging tests through PACS system.  I reviewed available ER/hospitalization records through the EMR.  Patients symptoms are consistent with URI, likely viral etiology. Discussed that antibiotics are not indicated for viral infections. Pt also with pharyngitis, likely viral.  One small site of possible exudate vs tonsilith therefore I will treat with PCN IM here in the department.  Pt afebrile without cervical lymphadenopathy. Presentation non concerning for PTA or infxn spread to soft tissue. No trismus or uvula deviation. Specific return precautions discussed. Pt able to drink water in ED without difficulty with intact air way. Recommended PCP follow up. I have also discussed reasons to return immediately to the ER.  Pt will be discharged with symptomatic treatment for her URI symptoms.  Verbalizes understanding and is agreeable with plan. Pt is hemodynamically stable & in NAD prior to dc.  Patient expresses understanding and agrees with plan.     I personally performed the services described in this documentation, which was scribed in my presence. The recorded information has been reviewed and is accurate.   Dierdre Forth, PA-C 03/27/13 1919

## 2013-03-28 NOTE — ED Provider Notes (Signed)
Medical screening examination/treatment/procedure(s) were performed by non-physician practitioner and as supervising physician I was immediately available for consultation/collaboration.  Lurlie Wigen, MD 03/28/13 1548 

## 2013-03-29 LAB — CULTURE, GROUP A STREP

## 2013-03-30 ENCOUNTER — Emergency Department (HOSPITAL_COMMUNITY)
Admission: EM | Admit: 2013-03-30 | Discharge: 2013-03-30 | Disposition: A | Discharge status: Home / Self Care | Payer: Self-pay | Attending: Emergency Medicine | Admitting: Emergency Medicine

## 2013-03-30 ENCOUNTER — Encounter (HOSPITAL_COMMUNITY): Payer: Self-pay | Admitting: Emergency Medicine

## 2013-03-30 DIAGNOSIS — R51 Headache: Secondary | ICD-10-CM | POA: Insufficient documentation

## 2013-03-30 DIAGNOSIS — R509 Fever, unspecified: Secondary | ICD-10-CM | POA: Insufficient documentation

## 2013-03-30 DIAGNOSIS — R110 Nausea: Secondary | ICD-10-CM | POA: Insufficient documentation

## 2013-03-30 DIAGNOSIS — IMO0002 Reserved for concepts with insufficient information to code with codable children: Secondary | ICD-10-CM | POA: Insufficient documentation

## 2013-03-30 DIAGNOSIS — Z79899 Other long term (current) drug therapy: Secondary | ICD-10-CM | POA: Insufficient documentation

## 2013-03-30 DIAGNOSIS — J029 Acute pharyngitis, unspecified: Secondary | ICD-10-CM | POA: Insufficient documentation

## 2013-03-30 DIAGNOSIS — H9209 Otalgia, unspecified ear: Secondary | ICD-10-CM | POA: Insufficient documentation

## 2013-03-30 DIAGNOSIS — IMO0001 Reserved for inherently not codable concepts without codable children: Secondary | ICD-10-CM | POA: Insufficient documentation

## 2013-03-30 DIAGNOSIS — F329 Major depressive disorder, single episode, unspecified: Secondary | ICD-10-CM | POA: Insufficient documentation

## 2013-03-30 DIAGNOSIS — F17200 Nicotine dependence, unspecified, uncomplicated: Secondary | ICD-10-CM | POA: Insufficient documentation

## 2013-03-30 DIAGNOSIS — F3289 Other specified depressive episodes: Secondary | ICD-10-CM | POA: Insufficient documentation

## 2013-03-30 DIAGNOSIS — R059 Cough, unspecified: Secondary | ICD-10-CM | POA: Insufficient documentation

## 2013-03-30 DIAGNOSIS — R05 Cough: Secondary | ICD-10-CM | POA: Insufficient documentation

## 2013-03-30 MED ORDER — DEXAMETHASONE SODIUM PHOSPHATE 10 MG/ML IJ SOLN
10.0000 mg | Freq: Once | INTRAMUSCULAR | Status: AC
  Administered 2013-03-30: 10 mg via INTRAMUSCULAR
  Filled 2013-03-30: qty 1

## 2013-03-30 MED ORDER — KETOROLAC TROMETHAMINE 60 MG/2ML IM SOLN
60.0000 mg | Freq: Once | INTRAMUSCULAR | Status: AC
  Administered 2013-03-30: 60 mg via INTRAMUSCULAR
  Filled 2013-03-30: qty 2

## 2013-03-30 MED ORDER — IBUPROFEN 600 MG PO TABS: 600.0000 mg | ORAL_TABLET | Freq: Four times a day (QID) | ORAL | Status: DC | PRN

## 2013-03-30 NOTE — ED Notes (Signed)
Pt states she was seen here on Sunday due to sore throat and ear pain  Pt states she had a strep test that was negative and received a shot of PCN  Pt states she has been using mucinex and hydrocodone syrup  Pt states her throat continues to hurt along with her ear  Pt states she has general body aches  Pt states she feels as though she is getting worse  Here for a recheck

## 2013-03-30 NOTE — ED Provider Notes (Signed)
History    This chart was scribed for non-physician practitioner Rhea Bleacher working with Celene Kras, MD by Quintella Reichert, ED scribe.  This patient was seen in room WTR9/WTR9 and the patient's care was started at 8:54 PM.   CSN: [id number]  Arrival date & time 03/30/13  1953      Chief Complaint  Patient presents with  . Generalized Body Aches     The history is provided by the patient. No language interpreter was used.    HPI Comments: Alicia Rosario is a 32 y.o. female who presents to the Emergency Department complaining of progressively-worsening, constant sore throat that began 6 days ago, with accompanying productive cough, generalized body aches, left ear pain, and headache.  Throat pain is aggravated by swallowing.  Pt came to ED 3 days ago for sore throat and was diagnosed with a likely viral URI, and started on Mucinex and Hycodan cough syrup.  She notes that Hycodan provides moderate temporary relief from throat pain, but that overall symptoms have continued to worsen since that time.  She also reports intermittent low-grade fever (100 F or less) that is relieved by Tylenol.  Additionally she notes a transient episode of nausea that occurred on standing up.  She denies emesis or rhinorrhea.  Imaging and strep test taken at her last ED visit were negative for pneumonia and strep throat.  Pt denies any other medical conditions or regular medication usage.  She denies medication allergies.      Past Medical History  Diagnosis Date  . Depression     Past Surgical History  Procedure Laterality Date  . Cholecystectomy    . Mouth surgery      History reviewed. No pertinent family history.  History  Substance Use Topics  . Smoking status: Current Every Day Smoker  . Smokeless tobacco: Not on file  . Alcohol Use: Yes     Comment: "at least a 22 oz beer everyday"    OB History   Grav Para Term Preterm Abortions TAB SAB Ect Mult Living                  Review of  Systems  Constitutional: Positive for fever (low-grade).  HENT: Positive for ear pain and sore throat. Negative for rhinorrhea.   Respiratory: Positive for cough.   Gastrointestinal: Positive for nausea (1 episode). Negative for vomiting.  Neurological: Positive for headaches.  All other systems reviewed and are negative.    Allergies  Review of patient's allergies indicates no known allergies.  Home Medications   Current Outpatient Rx  Name  Route  Sig  Dispense  Refill  . atorvastatin (LIPITOR) 10 MG tablet   Oral   Take 1 tablet (10 mg total) by mouth at bedtime.   30 tablet   5   . guaiFENesin (MUCINEX) 600 MG 12 hr tablet   Oral   Take 2 tablets (1,200 mg total) by mouth 2 (two) times daily.   30 tablet   0   . HYDROcodone-homatropine (HYCODAN) 5-1.5 MG/5ML syrup   Oral   Take 5 mLs by mouth every 6 (six) hours as needed for cough.   120 mL   0   . levonorgestrel (MIRENA) 20 MCG/24HR IUD   Intrauterine   1 each by Intrauterine route once.         . loratadine (CLARITIN) 10 MG tablet   Oral   Take 10 mg by mouth daily.         Marland Kitchen  Pseudoephedrine-APAP-DM (DAYQUIL PO)   Oral   Take 1 capsule by mouth daily.         . ranitidine (ZANTAC) 150 MG tablet   Oral   Take 150 mg by mouth 2 (two) times daily.         Marland Kitchen EXPIRED: citalopram (CELEXA) 20 MG tablet   Oral   Take 1 tablet (20 mg total) by mouth daily.   30 tablet   0   . fluticasone (FLONASE) 50 MCG/ACT nasal spray   Nasal   Place 2 sprays into the nose daily.   16 g   2     BP 115/77  Pulse 98  Temp(Src) 99.7 F (37.6 C) (Oral)  Resp 18  Ht 5\' 3"  (1.6 m)  Wt 218 lb (98.884 kg)  BMI 38.63 kg/m2  SpO2 100%  Physical Exam  Nursing note and vitals reviewed. Constitutional: She appears well-developed and well-nourished. No distress.  HENT:  Head: Normocephalic and atraumatic. No trismus in the jaw.  Right Ear: Tympanic membrane, external ear and ear canal normal.  Left Ear:  Tympanic membrane, external ear and ear canal normal.  Nose: Nose normal. No mucosal edema or rhinorrhea.  Mouth/Throat: Uvula is midline, oropharynx is clear and moist and mucous membranes are normal. Mucous membranes are not dry. No oral lesions. No edematous. No oropharyngeal exudate, posterior oropharyngeal edema, posterior oropharyngeal erythema or tonsillar abscesses.  Pharyngeal erythema without exudates No peritonsillar abscess Uvula midline Mucous membranes moist  Eyes: Conjunctivae and EOM are normal. Right eye exhibits no discharge. Left eye exhibits no discharge.  Neck: Normal range of motion. Neck supple. No tracheal deviation present.  No lymphadenopathy  Cardiovascular: Normal rate, regular rhythm and normal heart sounds.   Pulmonary/Chest: Effort normal and breath sounds normal. No respiratory distress. She has no wheezes. She has no rales.  Abdominal: Soft. There is no tenderness.  Musculoskeletal: Normal range of motion.  Lymphadenopathy:    She has no cervical adenopathy.  Neurological: She is alert.  Skin: Skin is warm and dry.  Psychiatric: She has a normal mood and affect. Her behavior is normal.    ED Course  Procedures (including critical care time)  DIAGNOSTIC STUDIES: Oxygen Saturation is 100% on room air, normal by my interpretation.    COORDINATION OF CARE: 9:04 PM-Informed pt that symptoms are likely due to a viral URI that will resolve on its own.  Discussed treatment plan which includes continuing current medicine regime, and Toradol and Decadron injection in ED for pain relief.  Pt verbalized understanding and was agreeable.   Labs Reviewed - No data to display No results found.   1. Pharyngitis    Patient seen and examined. Medications ordered.   Vital signs reviewed and are as follows: Filed Vitals:   03/30/13 2000  BP: 115/77  Pulse: 98  Temp: 99.7 F (37.6 C)  Resp: 18   Patient urged to return with worsening symptoms or other  concerns. Patient verbalized understanding and agrees with plan.     MDM  Constellation of symptoms likely to be viral. No PTA. Low grade fevers. Symptomatic relief indicated. Pt appears well, non-toxic.       I personally performed the services described in this documentation, which was scribed in my presence. The recorded information has been reviewed and is accurate.Renne Crigler, PA-C 03/31/13 0105

## 2013-04-02 NOTE — ED Provider Notes (Signed)
Medical screening examination/treatment/procedure(s) were performed by non-physician practitioner and as supervising physician I was immediately available for consultation/collaboration.   Jawanda Passey R Miraya Cudney, MD 04/02/13 1511 

## 2014-02-02 ENCOUNTER — Ambulatory Visit (INDEPENDENT_AMBULATORY_CARE_PROVIDER_SITE_OTHER): Payer: BC Managed Care – PPO | Admitting: Physician Assistant

## 2014-02-02 ENCOUNTER — Encounter: Payer: Self-pay | Admitting: Physician Assistant

## 2014-02-02 VITALS — BP 126/98 | HR 88 | Temp 98.2°F | Resp 18 | Ht 63.25 in | Wt 209.0 lb

## 2014-02-02 DIAGNOSIS — J988 Other specified respiratory disorders: Secondary | ICD-10-CM

## 2014-02-02 DIAGNOSIS — B9689 Other specified bacterial agents as the cause of diseases classified elsewhere: Principal | ICD-10-CM

## 2014-02-02 DIAGNOSIS — A499 Bacterial infection, unspecified: Secondary | ICD-10-CM

## 2014-02-02 MED ORDER — AZITHROMYCIN 250 MG PO TABS: ORAL_TABLET | ORAL | Status: DC

## 2014-02-02 NOTE — Progress Notes (Signed)
    Patient ID: Alicia Rosario MRN: [id number], DOB: 12-05-1980, 33 y.o. Date of Encounter: 02/02/2014, 12:47 PM    Chief Complaint:  Chief Complaint  Patient presents with  . c/o bloody mucous, ear hurts    throat hurts, wheezing     HPI: 33 y.o. year old AA female [place] that she's been sick for 4 or 5 days now. Started with  was sinus pressure and pain. Then developed discomfort in her ears and throat. Night feels like she is wheezing. Having some cough but is unable to get up any phlegm. She has no history of asthma and does not smoke. Does felt some symptomatic chills but no documented fevers.     Home Meds: See attached medication section for any medications that were entered at today's visit. The computer does not put those onto this list.The following list is a list of meds entered prior to today's visit.   Current Outpatient Prescriptions on File Prior to Visit  Medication Sig Dispense Refill  . levonorgestrel (MIRENA) 20 MCG/24HR IUD 1 each by Intrauterine route once.      . loratadine (CLARITIN) 10 MG tablet Take 10 mg by mouth daily.      . ranitidine (ZANTAC) 150 MG tablet Take 150 mg by mouth 2 (two) times daily.       No current facility-administered medications on file prior to visit.    Allergies: No Known Allergies    Review of Systems: See HPI for pertinent ROS. All other ROS negative.    Physical Exam: Blood pressure 126/98, pulse 88, temperature 98.2 F (36.8 C), temperature source Oral, resp. rate 18, height 5' 3.25" (1.607 m), weight 209 lb (94.802 kg)., Body mass index is 36.71 kg/(m^2). General:  AAF. Appears in no acute distress. HEENT: Normocephalic, atraumatic, eyes without discharge, sclera non-icteric, nares are without discharge. Bilateral auditory canals clear, TM's are without perforation, pearly grey and translucent with reflective cone of light bilaterally. Oral cavity moist, posterior pharynx without exudate, erythema, peritonsillar abscess. No  tenderness with percussion of maxillary sinuses bilaterally. Mild tenderness with percussion of frontal sinus.  Neck: Supple. No thyromegaly. No lymphadenopathy. Lungs: Clear bilaterally to auscultation without wheezes, rales, or rhonchi. Breathing is unlabored. Heart: Regular rhythm. No murmurs, rubs, or gallops. Msk:  Strength and tone normal for age. Extremities/Skin: Warm and dry.  No rashes. Neuro: Alert and oriented X 3. Moves all extremities spontaneously. Gait is normal. CNII-XII grossly in tact. Psych:  Responds to questions appropriately with a normal affect.     ASSESSMENT AND PLAN:  33 y.o. year old female with  1. Bacterial respiratory infection - azithromycin (ZITHROMAX) 250 MG tablet; Day 1: Take 2 daily.  Days 2-5: Take 1 daily.  Dispense: 6 tablet; Refill: 0 Also recommend using Mucinex DM as expectorant. We'll give her a note to be out of work for today and tomorrow. F/U if  symptoms worsen or do not resolve within one week after completion of antibiotic.  [address]igned, Alicia Rosario, [place], [facility] 02/02/2014 12:47 PM

## 2014-02-06 ENCOUNTER — Ambulatory Visit: Payer: Self-pay | Admitting: Physician Assistant

## 2014-02-22 ENCOUNTER — Encounter: Payer: Self-pay | Admitting: Family Medicine

## 2014-02-22 ENCOUNTER — Ambulatory Visit (INDEPENDENT_AMBULATORY_CARE_PROVIDER_SITE_OTHER): Payer: BC Managed Care – PPO | Admitting: Family Medicine

## 2014-02-22 VITALS — BP 132/76 | HR 78 | Temp 98.4°F | Resp 16 | Ht 63.75 in | Wt 209.0 lb

## 2014-02-22 DIAGNOSIS — R109 Unspecified abdominal pain: Secondary | ICD-10-CM | POA: Insufficient documentation

## 2014-02-22 DIAGNOSIS — L301 Dyshidrosis [pompholyx]: Secondary | ICD-10-CM

## 2014-02-22 DIAGNOSIS — G5600 Carpal tunnel syndrome, unspecified upper limb: Secondary | ICD-10-CM | POA: Insufficient documentation

## 2014-02-22 DIAGNOSIS — R197 Diarrhea, unspecified: Secondary | ICD-10-CM

## 2014-02-22 DIAGNOSIS — K921 Melena: Secondary | ICD-10-CM

## 2014-02-22 MED ORDER — CLOBETASOL PROP EMOLLIENT BASE 0.05 % EX CREA: TOPICAL_CREAM | CUTANEOUS | Status: DC

## 2014-02-22 MED ORDER — DICYCLOMINE HCL 20 MG PO TABS: 20.0000 mg | ORAL_TABLET | Freq: Four times a day (QID) | ORAL | Status: DC

## 2014-02-22 NOTE — Assessment & Plan Note (Signed)
Per above, bentyl

## 2014-02-22 NOTE — Assessment & Plan Note (Signed)
Her change in bowel movements is very concerning. Also noticing some blood when she wipes that she has no hemorrhoids. I will start her on Bentyl to help with the spasms I think there is a high probability of ureteral bowel syndrome versus inflammatory bowel syndrome. I will obtain a CT of abdomen and pelvis she will be seen by gastroenterology Metabolic panel CBC and lipase are also done

## 2014-02-22 NOTE — Assessment & Plan Note (Signed)
Topical clobetasol cream

## 2014-02-22 NOTE — Patient Instructions (Signed)
CT Scan of abdomen Try the bentyl  Nerve conduction study to be done Use the clobetasol for your hand  We will call with appointments and lab results F/U 2 months

## 2014-02-22 NOTE — Assessment & Plan Note (Signed)
Concerned as she has severe carpal tunnel in her right side she's already dropping objects and has significant numbness and tingling. She does not have any neuropathic pain therefore I will hold on starting gabapentin and her seat with nerve conduction study

## 2014-02-22 NOTE — Progress Notes (Signed)
Patient ID: Alicia SchoonerKimberly L Bergerson, female   DOB: [birth year], 33 y.o.   MRN: [id number]   Subjective:    Patient ID: Alicia SchoonerKimberly L Burges, female    DOB: [birth year], 33 y.o.   MRN: [id number]  Patient presents for R arm goes numb and frequent loose stools  patient here with 2 concerns. She's been having right arm and hand numbness for the past 2-3 months. It is been progressively worse. She also gets tingling in her fingertips. She came in because she noticed that she dropped 3 glasses because she cannot feel her hand. It is worse when she is driving and she is right-handed. She's noticed it now that she is using her left hand more. 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 She's not had any injury to her neck and does not have any pain in the arm.  Diarrhea-she's had loose stools after meals for the past 6 months of weeks. She has had a cholecystectomy but this is about 5 years ago. Prior to 6 months ago her bowels were normal other she would not have a bowel movement every day. She notices now that anything that she eats whether his fried greasy or even just plain vegetables or bland foods she will have cramping and we'll have to use the rest room directly afterwards. She did notice some blood when she wiped the past few weeks.. She's not had any black tarry stools. She's not having nausea vomiting associated. She does have some heartburn and uses ranitidine as needed. She does not have a family history of colon cancer or inflammatory bowel disorder.    Review Of Systems:  GEN- denies fatigue, fever, weight loss,weakness, recent illness HEENT- denies eye drainage, change in vision, nasal discharge, CVS- denies chest pain, palpitations RESP- denies SOB, cough, wheeze ABD- denies N/V, +change in stools,+ abd pain GU- denies dysuria, hematuria, dribbling, incontinence MSK- denies joint pain, muscle aches, injury Neuro- denies headache, dizziness, syncope, seizure activity      Objective:    BP 132/76  Pulse 78  Temp(Src) 98.4 F (36.9 C) (Oral)  Resp 16  Ht 5' 3.75" (1.619 m)  Wt 209 lb (94.802 kg)  BMI 36.17 kg/m2 GEN- NAD, alert and oriented x3 HEENT- PERRL, EOMI, non injected sclera, pink conjunctiva, MMM, oropharynx clear Neck- Supple, Neg Spurlings  CVS- RRR, no murmur RESP-CTAB ABD-NABS,soft,NT,ND Neuro- CNII-XII in tact, +phalens , +tinels, normal grasp bilat, normal tone, DTR symmetric, strength equal bilat UE Skin -eczematous rash sides of thumbs, index finger EXT- No edema Pulses- Radial, DP- 2+        Assessment & Plan:      Problem List Items Addressed This Visit   Dyshidrotic eczema   Relevant Medications      Clobetasol Prop Emollient Base (CLOBETASOL PROPIONATE E) 0.05 % emollient cream   Diarrhea   Carpal tunnel syndrome   Abdominal pain, unspecified site - Primary   Relevant Orders      CBC with Differential      Comprehensive metabolic panel      Lipase      Note: This dictation was prepared with Dragon dictation along with smaller phrase technology. Any transcriptional errors that result from this process are unintentional.

## 2014-02-23 LAB — CBC WITH DIFFERENTIAL/PLATELET
BASOS ABS: 0 10*3/uL (ref 0.0–0.1)
Basophils Relative: 0 % (ref 0–1)
EOS ABS: 0.3 10*3/uL (ref 0.0–0.7)
EOS PCT: 4 % (ref 0–5)
HEMATOCRIT: 43.2 % (ref 36.0–46.0)
Hemoglobin: 14.8 g/dL (ref 12.0–15.0)
LYMPHS PCT: 32 % (ref 12–46)
Lymphs Abs: 2.7 10*3/uL (ref 0.7–4.0)
MCH: 25.3 pg — AB (ref 26.0–34.0)
MCHC: 34.3 g/dL (ref 30.0–36.0)
MCV: 73.7 fL — AB (ref 78.0–100.0)
MONO ABS: 0.9 10*3/uL (ref 0.1–1.0)
Monocytes Relative: 10 % (ref 3–12)
Neutro Abs: 4.6 10*3/uL (ref 1.7–7.7)
Neutrophils Relative %: 54 % (ref 43–77)
PLATELETS: 261 10*3/uL (ref 150–400)
RBC: 5.86 MIL/uL — ABNORMAL HIGH (ref 3.87–5.11)
RDW: 14.5 % (ref 11.5–15.5)
WBC: 8.5 10*3/uL (ref 4.0–10.5)

## 2014-02-23 LAB — COMPREHENSIVE METABOLIC PANEL
ALT: 48 U/L — ABNORMAL HIGH (ref 0–35)
AST: 37 U/L (ref 0–37)
Albumin: 4.2 g/dL (ref 3.5–5.2)
Alkaline Phosphatase: 60 U/L (ref 39–117)
BILIRUBIN TOTAL: 0.6 mg/dL (ref 0.2–1.2)
BUN: 8 mg/dL (ref 6–23)
CALCIUM: 9.4 mg/dL (ref 8.4–10.5)
CHLORIDE: 102 meq/L (ref 96–112)
CO2: 26 mEq/L (ref 19–32)
CREATININE: 0.57 mg/dL (ref 0.50–1.10)
Glucose, Bld: 84 mg/dL (ref 70–99)
Potassium: 3.8 mEq/L (ref 3.5–5.3)
Sodium: 141 mEq/L (ref 135–145)
Total Protein: 7.5 g/dL (ref 6.0–8.3)

## 2014-02-23 LAB — LIPASE

## 2014-02-24 ENCOUNTER — Other Ambulatory Visit: Payer: Medicaid Other

## 2014-02-27 ENCOUNTER — Ambulatory Visit (INDEPENDENT_AMBULATORY_CARE_PROVIDER_SITE_OTHER): Payer: BC Managed Care – PPO

## 2014-02-27 DIAGNOSIS — R209 Unspecified disturbances of skin sensation: Secondary | ICD-10-CM

## 2014-02-27 DIAGNOSIS — G5600 Carpal tunnel syndrome, unspecified upper limb: Secondary | ICD-10-CM

## 2014-02-27 NOTE — Procedures (Signed)
     HISTORY:  Alicia PollKimberly Rosario is a 33 year old patient with a history of paresthesias involving the right upper extremity over the last 4 weeks prior to this evaluation. She is being evaluated for a possible neuropathy.  NERVE CONDUCTION STUDIES:  Nerve conduction studies were performed on both upper extremities. The distal motor latencies and motor amplitudes for the median and ulnar nerves were within normal limits. The F wave latencies and nerve conduction velocities for these nerves were also normal. The sensory latencies for the median and ulnar nerves were normal.   EMG STUDIES:  EMG evaluation was not performed.  IMPRESSION:  Nerve conduction studies done on both upper extremities were within normal limits. No evidence of a neuropathy is seen. If a cervical radiculopathy is clinically suspected, we would recommend EMG evaluation of the affected extremity.  Marlan Palau. Keith Willis MD 02/27/2014 11:24 AM  [facility] [address] [place], [place] [id number]  Phone [phone] Fax [phone]

## 2014-03-02 ENCOUNTER — Other Ambulatory Visit: Payer: Self-pay | Admitting: *Deleted

## 2014-03-02 DIAGNOSIS — G609 Hereditary and idiopathic neuropathy, unspecified: Secondary | ICD-10-CM

## 2014-03-02 DIAGNOSIS — R202 Paresthesia of skin: Secondary | ICD-10-CM

## 2014-03-22 ENCOUNTER — Ambulatory Visit
Admission: RE | Admit: 2014-03-22 | Discharge: 2014-03-22 | Disposition: A | Discharge status: Home / Self Care | Payer: BC Managed Care – PPO | Source: Ambulatory Visit | Attending: Family Medicine | Admitting: Family Medicine

## 2014-03-22 DIAGNOSIS — G609 Hereditary and idiopathic neuropathy, unspecified: Secondary | ICD-10-CM

## 2014-03-22 DIAGNOSIS — R202 Paresthesia of skin: Secondary | ICD-10-CM

## 2014-03-23 ENCOUNTER — Other Ambulatory Visit: Payer: Self-pay | Admitting: *Deleted

## 2014-03-23 MED ORDER — GABAPENTIN 300 MG PO CAPS: 300.0000 mg | ORAL_CAPSULE | Freq: Every day | ORAL | Status: DC

## 2014-03-29 ENCOUNTER — Telehealth: Payer: Self-pay | Admitting: *Deleted

## 2014-03-29 ENCOUNTER — Other Ambulatory Visit: Payer: Self-pay | Admitting: Family Medicine

## 2014-03-29 DIAGNOSIS — R197 Diarrhea, unspecified: Secondary | ICD-10-CM

## 2014-03-29 DIAGNOSIS — K921 Melena: Secondary | ICD-10-CM

## 2014-03-29 DIAGNOSIS — R109 Unspecified abdominal pain: Secondary | ICD-10-CM

## 2014-03-29 NOTE — Telephone Encounter (Signed)
Pt called stating that she ready for her to get CT scan it was ordered 02/22/14 but at that time she could not afford the cost, she is now having some issues and still in pain I called [facility] to see if we can reschedule her and the order has been cancelled, I had to do a new order for CT scan. Order is placed and appointment is being made,

## 2014-04-04 ENCOUNTER — Encounter (INDEPENDENT_AMBULATORY_CARE_PROVIDER_SITE_OTHER): Payer: Self-pay

## 2014-04-04 ENCOUNTER — Ambulatory Visit
Admission: RE | Admit: 2014-04-04 | Discharge: 2014-04-04 | Disposition: A | Discharge status: Home / Self Care | Payer: BC Managed Care – PPO | Source: Ambulatory Visit | Attending: Family Medicine | Admitting: Family Medicine

## 2014-04-04 DIAGNOSIS — R109 Unspecified abdominal pain: Secondary | ICD-10-CM

## 2014-04-04 DIAGNOSIS — K921 Melena: Secondary | ICD-10-CM

## 2014-04-04 DIAGNOSIS — R197 Diarrhea, unspecified: Secondary | ICD-10-CM

## 2014-04-04 MED ORDER — IOHEXOL 300 MG/ML  SOLN
125.0000 mL | Freq: Once | INTRAMUSCULAR | Status: AC | PRN
  Administered 2014-04-04: 125 mL via INTRAVENOUS

## 2014-04-13 ENCOUNTER — Telehealth: Payer: Self-pay | Admitting: *Deleted

## 2014-04-13 MED ORDER — POLYETHYLENE GLYCOL 3350 17 GM/SCOOP PO POWD: 17.0000 g | Freq: Two times a day (BID) | ORAL | Status: DC | PRN

## 2014-04-13 NOTE — Telephone Encounter (Signed)
Message copied by Phillips OdorSIX, CHRISTINA H on Thu Apr 13, 2014 12:08 PM ------      Message from: Malvin JohnsBULLINS, SUSAN S      Created: Thu Apr 13, 2014 10:19 AM       Patient is calling about her ibs and some symptoms she is having       [phone] ------

## 2014-04-13 NOTE — Telephone Encounter (Signed)
Patient returned call.   States that she is taking Bentyl for IBS. Reports that she usually has more frequent days of loose stools than constipation. However, she has not had any stool in 3 days and has lost her appetite.   Requested advise on use of OTC stool softener or laxative.   Please advise.

## 2014-04-13 NOTE — Telephone Encounter (Signed)
Recommend use MiraLax. Recommend using 2 capfuls to start with. If no BM in 24 hours then can repeat this and can repeat more frequently if needed.

## 2014-04-13 NOTE — Telephone Encounter (Signed)
Call placed to patient to make aware. LMTRC. 

## 2014-04-14 NOTE — Telephone Encounter (Signed)
Prescription sent to pharmacy.   Call placed to patient to make aware. LMTRC.  

## 2014-04-19 ENCOUNTER — Encounter: Payer: Self-pay | Admitting: Family Medicine

## 2014-04-19 ENCOUNTER — Ambulatory Visit (INDEPENDENT_AMBULATORY_CARE_PROVIDER_SITE_OTHER): Payer: BC Managed Care – PPO | Admitting: Family Medicine

## 2014-04-19 VITALS — BP 128/72 | HR 64 | Temp 97.8°F | Resp 14 | Wt 201.0 lb

## 2014-04-19 DIAGNOSIS — F331 Major depressive disorder, recurrent, moderate: Secondary | ICD-10-CM

## 2014-04-19 DIAGNOSIS — K589 Irritable bowel syndrome without diarrhea: Secondary | ICD-10-CM | POA: Insufficient documentation

## 2014-04-19 DIAGNOSIS — G5690 Unspecified mononeuropathy of unspecified upper limb: Secondary | ICD-10-CM

## 2014-04-19 DIAGNOSIS — G5691 Unspecified mononeuropathy of right upper limb: Secondary | ICD-10-CM

## 2014-04-19 DIAGNOSIS — F329 Major depressive disorder, single episode, unspecified: Secondary | ICD-10-CM | POA: Insufficient documentation

## 2014-04-19 MED ORDER — GABAPENTIN 100 MG PO CAPS: ORAL_CAPSULE | ORAL | Status: DC

## 2014-04-19 MED ORDER — PAROXETINE HCL 10 MG PO TABS: 10.0000 mg | ORAL_TABLET | Freq: Every day | ORAL | Status: DC

## 2014-04-19 MED ORDER — DICYCLOMINE HCL 20 MG PO TABS
20.0000 mg | ORAL_TABLET | Freq: Four times a day (QID) | ORAL | Status: DC
Start: 2014-04-19 — End: 2014-05-25

## 2014-04-19 NOTE — Patient Instructions (Addendum)
Try the amtiza twice a day , hold the dicyclomine  Start paxil at 10mg  Decrease the gabapentin   Referral to psychiatry F/U 2 months

## 2014-04-19 NOTE — Progress Notes (Signed)
Patient ID: Alicia SchoonerKimberly L Wahab, female   DOB: June 06, 1981, 33 y.o.   MRN: [id number]   Subjective:    Patient ID: Alicia Rosario, female    DOB: June 06, 1981, 33 y.o.   MRN: [id number]  Patient presents for F/U Carpal Tunnel and discuss IBS  patient here to followup medications. She was seen in April with multiple concerns. She was having symptoms of carpal Connell syndrome on the right hand where she was dropping items also had tingling and numbness she had nerve conduction study done which was unremarkable I did go ahead and start her on gabapentin and this worked very well for the numbness and tingling however it makes her feel very drowsy and trunk until the next day. She's currently taking 300 mg at bedtime once no she could possibly decrease this a little as it does help with her symptoms.  -year-old bowel syndrome she has CT scan which does not show any evidence of colitis she was started on dicyclomine for the multiple bowel movements and does help however she thinks want him to being constipated with cramping and bloating and then swung back to diarrhea after she took some MiraLAX and Senokot. She's been avoiding certain foods which she's done for some time now that she knows triggers are her diarrhea from fried foods to spicy foods and oily food she does not notice any change with gluten products specifically she does avoid some dairy foods.  She then stated that for the past couple weeks she's not been able to sleep and finally states she thinks it is related to her depression. She has history of long-standing depression she has suicidal attempt as a teenager, she was on medications in the past she actually did fairly well with Paxil. Which she lost her insurance she stopped her medication as well as her therapy sessions. Her family is not very supportive her mother is an alcoholic and also suffers with depression but has not sought help for this. She states she is a lot of negativity with her  mother as well as her siblings therefore she keeps herself closed off. She also has a lot of stress with her own 2 children her eldest has been acting out and she thinks that she may have to put her in counseling. She denies any suicidal ideations currently. States that she feels very depressed or she gets very angry    Review Of Systems:  GEN- denies fatigue, fever, weight loss,weakness, recent illness HEENT- denies eye drainage, change in vision, nasal discharge, CVS- denies chest pain, palpitations RESP- denies SOB, cough, wheeze ABD- denies N/V, change in stools, +abd pain MSK- denies joint pain, muscle aches, injury Neuro- denies headache, dizziness, syncope, seizure activity       Objective:    BP 128/72  Pulse 64  Temp(Src) 97.8 F (36.6 C) (Oral)  Resp 14  Wt 201 lb (91.173 kg) GEN- NAD, alert and oriented x3 Neck- Supple, FROM CVS- RRR, no murmur RESP-CTAB ABD-NABS,soft,NT,ND Psych- crying discussing family, not overly depressed or anxious appearing, no SI, well groomed Pulses- Radial 2+        Assessment & Plan:      Problem List Items Addressed This Visit   None      Note: This dictation was prepared with Dragon dictation along with smaller phrase technology. Any transcriptional errors that result from this process are unintentional.

## 2014-04-19 NOTE — Assessment & Plan Note (Signed)
Her nerve conduction study was actually negative however she has classic symptoms of carpal tunnel. She is improved with the gabapentin however the dose at 300 mg makes her double somnolent I will decrease her to 100 mg a she can take 1-2 at bedtime

## 2014-04-19 NOTE — Assessment & Plan Note (Signed)
Contracted for safety oh restart Paxil at 10 mg we will get her set back up with therapy and psychiatry she does not have any suicidal ideations but she has had suicide attempt in the past. She is a very poor support network with her family

## 2014-04-19 NOTE — Assessment & Plan Note (Signed)
Now funny more out about her depression or if this is also contributing to her in her bowels. I did give her some him Amitiza to try is now she is getting the constipation this does not help she will resume her dicyclomine we will try to get her with gastroenterology but will need to space out the appointments here

## 2014-04-24 ENCOUNTER — Ambulatory Visit: Payer: BC Managed Care – PPO | Admitting: Family Medicine

## 2014-04-25 ENCOUNTER — Encounter (HOSPITAL_COMMUNITY): Payer: Self-pay | Admitting: Emergency Medicine

## 2014-04-25 ENCOUNTER — Emergency Department (INDEPENDENT_AMBULATORY_CARE_PROVIDER_SITE_OTHER)
Admission: EM | Admit: 2014-04-25 | Discharge: 2014-04-25 | Disposition: A | Discharge status: Home / Self Care | Payer: BC Managed Care – PPO | Source: Home / Self Care | Attending: Family Medicine | Admitting: Family Medicine

## 2014-04-25 DIAGNOSIS — Z7289 Other problems related to lifestyle: Secondary | ICD-10-CM

## 2014-04-25 DIAGNOSIS — T07XXXA Unspecified multiple injuries, initial encounter: Secondary | ICD-10-CM

## 2014-04-25 DIAGNOSIS — X789XXA Intentional self-harm by unspecified sharp object, initial encounter: Secondary | ICD-10-CM

## 2014-04-25 DIAGNOSIS — F322 Major depressive disorder, single episode, severe without psychotic features: Secondary | ICD-10-CM

## 2014-04-25 DIAGNOSIS — F489 Nonpsychotic mental disorder, unspecified: Secondary | ICD-10-CM

## 2014-04-25 DIAGNOSIS — L089 Local infection of the skin and subcutaneous tissue, unspecified: Secondary | ICD-10-CM

## 2014-04-25 DIAGNOSIS — T148XXA Other injury of unspecified body region, initial encounter: Principal | ICD-10-CM

## 2014-04-25 MED ORDER — CEPHALEXIN 500 MG PO CAPS: 500.0000 mg | ORAL_CAPSULE | Freq: Four times a day (QID) | ORAL | Status: DC

## 2014-04-25 MED ORDER — SULFAMETHOXAZOLE-TRIMETHOPRIM 800-160 MG PO TABS: 2.0000 | ORAL_TABLET | Freq: Two times a day (BID) | ORAL | Status: DC

## 2014-04-25 MED ORDER — MUPIROCIN 2 % EX OINT: 1.0000 "application " | TOPICAL_OINTMENT | Freq: Three times a day (TID) | CUTANEOUS | Status: DC

## 2014-04-25 NOTE — ED Provider Notes (Signed)
Alicia Rosario is a 33 y.o. female who presents to Urgent Care today for left forearm laceration. Patient is here today for a possibly infected laceration to the left forearm. She is currently being treated for major depression. She notes that she intentionally cut the skin on her left volar forearm 3 days ago. She denies this is a suicidal attempt. She currently denies any suicidal or homicidal ideation. At that time she felt overwhelmed. She has a history of one prior episode of cutting. She is an appointment with her psychiatrist tomorrow. She's currently taking Paxil.   Past Medical History  Diagnosis Date  . Depression    History  Substance Use Topics  . Smoking status: Former Games developermoker  . Smokeless tobacco: Never Used  . Alcohol Use: Yes     Comment: "at least a 22 oz beer everyday"   ROS as above Medications: No current facility-administered medications for this encounter.   Current Outpatient Prescriptions  Medication Sig Dispense Refill  . gabapentin (NEURONTIN) 100 MG capsule Take 1-2 tablets at bedtime  60 capsule  6  . lubiprostone (AMITIZA) 8 MCG capsule Take 8 mcg by mouth 2 (two) times daily with a meal.      . PARoxetine (PAXIL) 10 MG tablet Take 1 tablet (10 mg total) by mouth daily.  30 tablet  3  . cephALEXin (KEFLEX) 500 MG capsule Take 1 capsule (500 mg total) by mouth 4 (four) times daily.  28 capsule  0  . Clobetasol Prop Emollient Base (CLOBETASOL PROPIONATE E) 0.05 % emollient cream Apply to affected areas on hands BID prn  30 g  0  . dicyclomine (BENTYL) 20 MG tablet Take 1 tablet (20 mg total) by mouth every 6 (six) hours.  90 tablet  1  . fexofenadine (ALLEGRA) 180 MG tablet Take 180 mg by mouth daily.      Marland Kitchen. levonorgestrel (MIRENA) 20 MCG/24HR IUD 1 each by Intrauterine route once.      . loratadine (CLARITIN) 10 MG tablet Take 10 mg by mouth daily.      . mupirocin ointment (BACTROBAN) 2 % Apply 1 application topically 3 (three) times daily.  30 g  1  .  polyethylene glycol powder (GLYCOLAX/MIRALAX) powder Take 17 g by mouth 2 (two) times daily as needed.  3350 g  1  . ranitidine (ZANTAC) 150 MG tablet Take 150 mg by mouth 2 (two) times daily.      Marland Kitchen. sulfamethoxazole-trimethoprim (SEPTRA DS) 800-160 MG per tablet Take 2 tablets by mouth 2 (two) times daily.  28 tablet  0    Exam:  BP 154/94  Pulse 80  Temp(Src) 99 F (37.2 C) (Oral)  Resp 14  SpO2 99% Gen: Well NAD Skin: Shallow laceration to the left forearm. Crusting with one or 2 vesicles. No significant surrounding erythema or induration. Capillary refill sensation motion and are intact. Pulses are intact. Psych: Alert and oriented. Tearful affect. Affect is also somewhat flat. Speech and thought process are linear. Judgment is moderate. No active SI or HI. Patient states her plan if she feels suicidal is to call 911 or go to the hospital.  No results found for this or any previous visit (from the past 24 hour(s)). No results found.  Assessment and Plan: 33 y.o. female with wound infection following a self injurious behavior event.  This is obviously concerning for suicide attempt. Patient states this was not a suicide attempt however it is a self-harm behavior that has moderate to high  risk. She currently does not feel suicidal and has a good followup plan. Discussed the case with the ACT team at behavioral health Hospital. I recommended patient go to behavioral health hospital now for face-to-face evaluation. Patient declines and refuses. Both myself and the ACT team are concerned but as she is not actively suicidal feel as though she is not petitionable.   Patient verbally contracted for safety with myself and will go to her psychiatry appointment tomorrow. She will contact the crisis line or the emergency room if she worsens.   For the possible infection will treat with oral Keflex and Bactrim as well as topical bacitracin ointment.  Discussed warning signs or symptoms. Please see  discharge instructions. Patient expresses understanding.    Rodolph BongEvan S Corey, MD 04/25/14 2056

## 2014-04-25 NOTE — ED Notes (Signed)
Reports cutting left wrist on Sunday.  Area is red and draining.  State was just put on antidepressant a couple of days ago.  Pt has up coming appt. With psychiatrist on Monday.    Denies thought of suicide or homicide.

## 2014-04-25 NOTE — Discharge Instructions (Signed)
Thank you for coming in today. I encourage you to go to health behavioral [facility] tonight [address] [place], [place] [address] HelpLine: [phone] or [phone]  Please make sure you see your Dr. Bea Lauraomorrow.  Take both antibiotics and apply antibiotic ointment to the wound Come back as needed for wound care  If he feels like hurting your self or others please call 911 or the helpline listed above or [facility]. Or [facility]  Cellulitis Cellulitis is an infection of the skin and the tissue beneath it. The infected area is usually red and tender. Cellulitis occurs most often in the arms and lower legs.  CAUSES  Cellulitis is caused by bacteria that enter the skin through cracks or cuts in the skin. The most common types of bacteria that cause cellulitis are Staphylococcus and Streptococcus. SYMPTOMS   Redness and warmth.  Swelling.  Tenderness or pain.  Fever. DIAGNOSIS  Your caregiver can usually determine what is wrong based on a physical exam. Blood tests may also be done. TREATMENT  Treatment usually involves taking an antibiotic medicine. HOME CARE INSTRUCTIONS   Take your antibiotics as directed. Finish them even if you start to feel better.  Keep the infected arm or leg elevated to reduce swelling.  Apply a warm cloth to the affected area up to 4 times per day to relieve pain.  Only take over-the-counter or prescription medicines for pain, discomfort, or fever as directed by your caregiver.  Keep all follow-up appointments as directed by your caregiver. SEEK MEDICAL CARE IF:   You notice red streaks coming from the infected area.  Your red area gets larger or turns dark in color.  Your bone or joint underneath the infected area becomes painful after the skin has healed.  Your infection returns in the same area or another area.  You notice a swollen bump in the infected area.  You develop new  symptoms. SEEK IMMEDIATE MEDICAL CARE IF:   You have a fever.  You feel very sleepy.  You develop vomiting or diarrhea.  You have a general ill feeling (malaise) with muscle aches and pains. MAKE SURE YOU:   Understand these instructions.  Will watch your condition.  Will get help right away if you are not doing well or get worse. Document Released: 07/23/2005 Document Revised: 04/13/2012 Document Reviewed: 12/29/2011 [facility] Patient Information 2015 [employer] [place]. This information is not intended to replace advice given to you by your health care provider. Make sure you discuss any questions you have with your health care provider.    Suicide Resources  Who to Call  Call 911  National Suicide Prevention Hotline 1-800-SUICIDE or (800) (703)272-0945273-TALK(8255)  [facility] at [phone]) [phone](336) (667) [phone]; [phone](800) [phone]  More Resources  Suicide Awareness Voices of Education       603-832-0657(952) ([phone]        www.save.org  The [employer] on Mental Illness(NAMI)       (800) 950-NAMI        www.nami.org  American Association of Suicidology       ([phone](202) 765-611-9245        www.suicidology.org

## 2014-05-01 ENCOUNTER — Ambulatory Visit: Payer: BC Managed Care – PPO | Admitting: Family Medicine

## 2014-05-01 ENCOUNTER — Encounter: Payer: Self-pay | Admitting: *Deleted

## 2014-05-01 ENCOUNTER — Telehealth: Payer: Self-pay | Admitting: *Deleted

## 2014-05-01 MED ORDER — VENLAFAXINE HCL ER 37.5 MG PO CP24: 75.0000 mg | ORAL_CAPSULE | Freq: Every day | ORAL | Status: DC

## 2014-05-01 NOTE — Telephone Encounter (Signed)
Call placed to patient.   Reports that she has an appointment with psychiatrist on 05/02/2014.  Advised to keep appointment with psychiatrist and to go to ER if S/Sx of SI noted.

## 2014-05-01 NOTE — Telephone Encounter (Signed)
Call returned from [facility]. States that patient has appointment at 2pm with therapist.   Advised that no psychiatrist on site.

## 2014-05-01 NOTE — Telephone Encounter (Signed)
[facility], [phone].  [facility].

## 2014-05-01 NOTE — Telephone Encounter (Signed)
Call placed to patient to make aware. LMTRC.   Prescription sent to pharmacy.

## 2014-05-01 NOTE — Telephone Encounter (Signed)
Message copied by Phillips OdorSIX, Tanieka Pownall H on Mon May 01, 2014  1:43 PM ------      Message from: Malvin JohnsBULLINS, SUSAN S      Created: Mon May 01, 2014 12:53 PM      Contact: [phone]       Medication for the depression is not working according to patient  ------

## 2014-05-01 NOTE — Telephone Encounter (Signed)
Alicia LeeSabrina, this pt was sent to a counseling site only, she needs a psychiatrist, can you resend the referral  Have her stop the paxil and start effexor 37.5mg  once a day , she needs to increase to 2 tablets after 2 weeks  - Send script as effexor 37.5mg  2 tablets daily #60  R 3 - f/u in office 1 week

## 2014-05-01 NOTE — Telephone Encounter (Signed)
See if pt went to see her psychiatrist,  If not I want her to stop the paxil, start effexor ER 37.5mg  once a day, F/U in office in 1 week , go to ER if she has any SI

## 2014-05-01 NOTE — Telephone Encounter (Signed)
Returned call to patient.   States that she is having more increased anxiety than before she began Paxil.  States that she thinks she is having panic attacks, she can't sleep and she cannot focus.   MD please advise.

## 2014-05-02 NOTE — Telephone Encounter (Signed)
Call placed to patient to make aware. LMTRC. 

## 2014-05-03 NOTE — Telephone Encounter (Signed)
Received call from patient pharmacist (works with patient).   Verbally confirmed orders for Paxil and Effexor.   Advised that she needs visit in (1) week.

## 2014-05-05 NOTE — Telephone Encounter (Signed)
This encounter was created in error - please disregard.

## 2014-05-09 ENCOUNTER — Other Ambulatory Visit: Payer: Self-pay | Admitting: *Deleted

## 2014-05-09 MED ORDER — LUBIPROSTONE 8 MCG PO CAPS: 8.0000 ug | ORAL_CAPSULE | Freq: Two times a day (BID) | ORAL | Status: DC

## 2014-05-09 NOTE — Telephone Encounter (Signed)
Refill appropriate and filled per protocol. 

## 2014-05-24 ENCOUNTER — Telehealth: Payer: Self-pay | Admitting: *Deleted

## 2014-05-24 NOTE — Telephone Encounter (Signed)
Call placed to patient. LMTRC.  

## 2014-05-24 NOTE — Telephone Encounter (Signed)
Pt called stating that she has IBS and is taking Amitiza and states she has read that it is mainly for constipation and she is having more of the diarrhea instead of constipation, wants to know if she should stop the medication and start taking dicyclomine or if there is anything else you can prescribe to her. Please advise!

## 2014-05-24 NOTE — Telephone Encounter (Signed)
MD please advise

## 2014-05-24 NOTE — Telephone Encounter (Signed)
Please stop the amitiza if this is more diarrhea She can go back to the bentyl for now How did her visit with psychiatry go, update her new psych medications please

## 2014-05-25 MED ORDER — DICYCLOMINE HCL 20 MG PO TABS: 20.0000 mg | ORAL_TABLET | Freq: Four times a day (QID) | ORAL | Status: DC

## 2014-05-25 NOTE — Telephone Encounter (Signed)
Call placed to patient. LMTRC.  

## 2014-05-25 NOTE — Telephone Encounter (Signed)
Patient returned call and made aware.   Also states that she has not been to see the psychiatrist because she cannot afford the co-pay at this time. She is still going to counsceling.

## 2014-05-26 NOTE — Telephone Encounter (Signed)
Have a schedule a f/u for her mood in 4 weeks

## 2014-05-26 NOTE — Telephone Encounter (Signed)
Appointment scheduled.

## 2014-06-18 ENCOUNTER — Ambulatory Visit (INDEPENDENT_AMBULATORY_CARE_PROVIDER_SITE_OTHER): Payer: BC Managed Care – PPO | Admitting: Family Medicine

## 2014-06-18 VITALS — BP 112/76 | HR 80 | Temp 98.0°F | Resp 16 | Ht 64.0 in | Wt 198.0 lb

## 2014-06-18 DIAGNOSIS — R350 Frequency of micturition: Secondary | ICD-10-CM

## 2014-06-18 DIAGNOSIS — N949 Unspecified condition associated with female genital organs and menstrual cycle: Secondary | ICD-10-CM

## 2014-06-18 LAB — POCT URINALYSIS DIPSTICK
Bilirubin, UA: NEGATIVE
Blood, UA: NEGATIVE
Glucose, UA: NEGATIVE
Ketones, UA: NEGATIVE
Leukocytes, UA: NEGATIVE
Nitrite, UA: NEGATIVE
Protein, UA: NEGATIVE
Spec Grav, UA: 1.02
Urobilinogen, UA: 0.2
pH, UA: 7

## 2014-06-18 LAB — POCT UA - MICROSCOPIC ONLY
Casts, Ur, LPF, POC: NEGATIVE
Crystals, Ur, HPF, POC: NEGATIVE
Epithelial cells, urine per micros: NEGATIVE
Mucus, UA: NEGATIVE
RBC, URINE, MICROSCOPIC: NEGATIVE
WBC, UR, HPF, POC: NEGATIVE
Yeast, UA: NEGATIVE

## 2014-06-18 LAB — POCT WET PREP WITH KOH
KOH Prep POC: NEGATIVE
RBC WET PREP PER HPF POC: NEGATIVE
Trichomonas, UA: NEGATIVE
Yeast Wet Prep HPF POC: NEGATIVE

## 2014-06-18 MED ORDER — FLUCONAZOLE 150 MG PO TABS: 150.0000 mg | ORAL_TABLET | Freq: Once | ORAL | Status: DC

## 2014-06-18 MED ORDER — VALACYCLOVIR HCL 1 G PO TABS: 1000.0000 mg | ORAL_TABLET | Freq: Two times a day (BID) | ORAL | Status: DC

## 2014-06-18 MED ORDER — METRONIDAZOLE 500 MG PO TABS: 500.0000 mg | ORAL_TABLET | Freq: Two times a day (BID) | ORAL | Status: DC

## 2014-06-18 MED ORDER — HYDROCODONE-ACETAMINOPHEN 5-325 MG PO TABS
1.0000 | ORAL_TABLET | Freq: Three times a day (TID) | ORAL | Status: DC | PRN
Start: 2014-06-18 — End: 2014-06-26

## 2014-06-18 NOTE — Progress Notes (Addendum)
[facility] [address] [place] [address] [phone]  Date:  06/18/2014   Name:  Alicia Rosario   DOB:  06-23-1981   MRN:  [id number]  PCP:  Alicia Antis, MD    Chief Complaint: Groin Swelling and Urinary Frequency   History of Present Illness:  Alicia Rosario is a 33 y.o. very pleasant female patient who presents with the following:  She has noted vaginal discomfort and itching for the last 2 days or so, and has noted that her left labia are swollen.  Otherwise she has not noted any vaginal discharge.  She has pain with urination but thinks this is due to stinging of urine on her skin.  She has a mirena IUD and does not get menses.  She is here today with her female partner.   Otherwise she is generally healthy except for depression and IBS  Patient Active Problem List   Diagnosis Date Noted  . IBS (irritable bowel syndrome) 04/19/2014  . 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 MDD (major depressive disorder) 04/19/2014  . Abdominal pain, unspecified site 02/22/2014  . Carpal tunnel syndrome 02/22/2014  . Diarrhea 02/22/2014  . Dyshidrotic eczema 02/22/2014    Past Medical History  Diagnosis Date  . Depression   . Irritable bowel syndrome (IBS)     Past Surgical History  Procedure Laterality Date  . Cholecystectomy    . Mouth surgery      History  Substance Use Topics  . Smoking status: Former Games developer  . Smokeless tobacco: Never Used  . Alcohol Use: 1.5 oz/week    3 drink(s) per week     Comment: "at least a 22 oz beer everyday"    Family History  Problem Relation Age of Onset  . Mental illness Mother   . Hyperlipidemia Father   . Cancer Paternal Grandmother     Breast CA    No Known Allergies  Medication list has been reviewed and updated.  Current Outpatient Prescriptions on File Prior to Visit  Medication Sig Dispense Refill  . gabapentin (NEURONTIN) 100 MG capsule Take 1-2 tablets at bedtime  60 capsule  6  .  levonorgestrel (MIRENA) 20 MCG/24HR IUD 1 each by Intrauterine route once.      . mupirocin ointment (BACTROBAN) 2 % Apply 1 application topically 3 (three) times daily.  30 g  1  . venlafaxine XR (EFFEXOR XR) 37.5 MG 24 hr capsule Take 2 capsules (75 mg total) by mouth daily with breakfast.  60 capsule  3  . cephALEXin (KEFLEX) 500 MG capsule Take 1 capsule (500 mg total) by mouth 4 (four) times daily.  28 capsule  0  . Clobetasol Prop Emollient Base (CLOBETASOL PROPIONATE E) 0.05 % emollient cream Apply to affected areas on hands BID prn  30 g  0  . dicyclomine (BENTYL) 20 MG tablet Take 1 tablet (20 mg total) by mouth every 6 (six) hours.  90 tablet  1  . fexofenadine (ALLEGRA) 180 MG tablet Take 180 mg by mouth daily.      Marland Kitchen loratadine (CLARITIN) 10 MG tablet Take 10 mg by mouth daily.      . polyethylene glycol powder (GLYCOLAX/MIRALAX) powder Take 17 g by mouth 2 (two) times daily as needed.  3350 g  1  . ranitidine (ZANTAC) 150 MG tablet Take 150 mg by mouth 2 (two) times daily.      Marland Kitchen sulfamethoxazole-trimethoprim (SEPTRA DS) 800-160 MG per tablet Take  2 tablets by mouth 2 (two) times daily.  28 tablet  0   No current facility-administered medications on file prior to visit.    Review of Systems:  As per HPI- otherwise negative.   Physical Examination: Filed Vitals:   06/18/14 0936  BP: 112/76  Pulse: 80  Temp: 98 F (36.7 C)  Resp: 16   Filed Vitals:   06/18/14 0936  Height:  (1.626 m)  Weight: 198 lb (89.812 kg)   Body mass index is 33.97 kg/(m^2). Ideal Body Weight: Weight in (lb) to have BMI = 25: 145.3  GEN: WDWN, NAD, Non-toxic, A & O x 3, obese, looks well HEENT: Atraumatic, Normocephalic. Neck supple. No masses, No LAD. Ears and Nose: No external deformity. CV: RRR, No M/G/R. No JVD. No thrill. No extra heart sounds. PULM: CTA B, no wheezes, crackles, rhonchi. No retractions. No resp. distress. No accessory muscle use. ABD: S, NT, ND. No rebound. No  HSM. EXTR: No c/c/e NEURO Normal gait.  PSYCH: Normally interactive. Conversant. Not depressed or anxious appearing.  Calm demeanor.  She has tender left inguinal nodes.  The left labia majora is slightly swollen.  In the cleft between the left labia majora and minora is a linear ulcerated lesion that is quite tender to touch.  Took a viral culture of this area.  Vaginal exam is normal, no unusual discharge or lesions.  No CMT, no adnexal masses or tenderness.   Results for orders placed in visit on 06/18/14  POCT URINALYSIS DIPSTICK      Result Value Ref Range   Color, UA yellow     Clarity, UA clear     Glucose, UA neg     Bilirubin, UA neg     Ketones, UA neg     Spec Grav, UA 1.020     Blood, UA neg     pH, UA 7.0     Protein, UA neg     Urobilinogen, UA 0.2     Nitrite, UA neg     Leukocytes, UA Negative    POCT UA - MICROSCOPIC ONLY      Result Value Ref Range   WBC, Ur, HPF, POC neg     RBC, urine, microscopic neg     Bacteria, U Microscopic trace     Mucus, UA neg     Epithelial cells, urine per micros neg     Crystals, Ur, HPF, POC neg     Casts, Ur, LPF, POC neg     Yeast, UA neg    POCT WET PREP WITH KOH      Result Value Ref Range   Trichomonas, UA Negative     Clue Cells Wet Prep HPF POC 100%     Epithelial Wet Prep HPF POC 10-15     Yeast Wet Prep HPF POC neg     Bacteria Wet Prep HPF POC trace     RBC Wet Prep HPF POC neg     WBC Wet Prep HPF POC 5-7     KOH Prep POC Negative      Assessment and Plan: Pain of female genitalia - Plan: POCT Wet Prep with KOH, GC/Chlamydia Probe Amp, Herpes simplex virus culture, metroNIDAZOLE (FLAGYL) 500 MG tablet, valACYclovir (VALTREX) 1000 MG tablet, HYDROcodone-acetaminophen (NORCO/VICODIN) 5-325 MG per tablet  Urinary frequency - Plan: POCT urinalysis dipstick, POCT UA - Microscopic Only  Alicia Rosario seems to have BV today which we will treat with flagyl. However also explained to her  that I believe she may have herpes  causing the rest of her sx. She understands and we discussed this in detail.  Await her culture.  Treat also with valtrex for HSV, and gave a few hydrocodone to use as needed for pain  Signed Abbe Amsterdam, MD  8/27: called her to discuss.   She is feeling much better, the sore area is healed.  However, I apologized to her that her viral culture did not arrive at the lab.  Therefore we are not able to get a viral culture to determine if she had HSV.  Asked her to please come in for a lab visit only in a few weeks for an HSV titer- advised her that she should not pay a co-pay as this was our mistake

## 2014-06-18 NOTE — Patient Instructions (Signed)
I will be in touch with the rest of your labs asap. For the time being, we are going to treat you for bacterial vaginosis with flagyl and possible herpes with valtrex. I did a swab on the lesion on your labia for herpes and hopefully this will help [place] to know for sure.   You can also try vicodin as needed for pain- this will make you sleepy so be careful with this medication!    If you do have herpes, do not panic!  This is generally not a dangerous infection and can be managed by medication.  Avoid sexual contact until we talk.

## 2014-06-20 ENCOUNTER — Other Ambulatory Visit: Payer: Self-pay | Admitting: Family Medicine

## 2014-06-20 LAB — GC/CHLAMYDIA PROBE AMP
CT Probe RNA: NEGATIVE
GC Probe RNA: NEGATIVE

## 2014-06-20 NOTE — Telephone Encounter (Signed)
Refill appropriate and filled per protocol. 

## 2014-06-21 ENCOUNTER — Telehealth: Payer: Self-pay | Admitting: Radiology

## 2014-06-21 LAB — HERPES SIMPLEX VIRUS CULTURE

## 2014-06-21 NOTE — Telephone Encounter (Signed)
[facility] called--the wrong cx was sent for the HSV cx. There was only a gc probe sent. No viral cx tube. They will cancel the test.

## 2014-06-22 ENCOUNTER — Telehealth: Payer: Self-pay | Admitting: *Deleted

## 2014-06-22 NOTE — Telephone Encounter (Signed)
When collecting for the HSV the wrong swab was put into the HSV Viral culture bottle.  Would like for patient to come back in for recollect or can we draw blood?  This all happen at collection.

## 2014-06-22 NOTE — Telephone Encounter (Signed)
She plans to come in for a titer- at this time her lesion is healed.  I did ask that we not charge her a co-pay

## 2014-06-22 NOTE — Addendum Note (Signed)
Addended by: Abbe Amsterdam C on: 06/22/2014 11:46 AM   Modules accepted: Orders

## 2014-06-26 ENCOUNTER — Ambulatory Visit (INDEPENDENT_AMBULATORY_CARE_PROVIDER_SITE_OTHER): Payer: BC Managed Care – PPO | Admitting: Family Medicine

## 2014-06-26 ENCOUNTER — Encounter: Payer: Self-pay | Admitting: Family Medicine

## 2014-06-26 VITALS — BP 130/76 | HR 84 | Temp 98.2°F | Resp 14 | Ht 64.0 in | Wt 200.0 lb

## 2014-06-26 DIAGNOSIS — F39 Unspecified mood [affective] disorder: Secondary | ICD-10-CM | POA: Insufficient documentation

## 2014-06-26 DIAGNOSIS — F322 Major depressive disorder, single episode, severe without psychotic features: Secondary | ICD-10-CM

## 2014-06-26 DIAGNOSIS — G4700 Insomnia, unspecified: Secondary | ICD-10-CM

## 2014-06-26 MED ORDER — VENLAFAXINE HCL ER 150 MG PO CP24: 150.0000 mg | ORAL_CAPSULE | Freq: Every day | ORAL | Status: DC

## 2014-06-26 MED ORDER — LORAZEPAM 1 MG PO TABS: 1.0000 mg | ORAL_TABLET | Freq: Two times a day (BID) | ORAL | Status: DC | PRN

## 2014-06-26 NOTE — Progress Notes (Signed)
Patient ID: Alicia Rosario, female   DOB: September 08, 1981, 33 y.o.   MRN: [id number]   Subjective:    Patient ID: Alicia Rosario, female    DOB: 1981/07/14, 33 y.o.   MRN: [id number]  Patient presents for F/U Mood  Patient here to followup mood. At her last visit she was started on Prozac however she has side effects of this medication and increased suicidal ideations she was switched to Effexor she's currently on 75 mg once a day feels like it is helping she continues to have breakthrough anxiety anger issues as well as difficulty sleeping. She had an appointment with the psychiatrist however was upset when they're asking her about her financial questions therefore she did not go to the appointment. She has been seen therapy his however over the past 4 weeks has not had any visits because of changes in her work schedule. She is actually here today with significant other. She states she's been a boiling point. She actually tried to check herself and to behavioral health however she became upset when they would not answer the door or assist her. She did have a cutting episode in front of a family friend which was noted at a recent urgent care visit. She denies any active suicidal ideations but states that she just wants to her people in her past. She states that she is a lump a relationship with her family especially her sister and her mother. Regarding this frustration she has her teenage daughter she [place] for the summer with her biological father she is now back in their relationship has improved some. She's now starting to have some issues on the job 2 she does not think that she is being paid fairly   Review Of Systems:  GEN- denies fatigue, fever, weight loss,weakness, recent illness HEENT- denies eye drainage, change in vision, nasal discharge, CVS- denies chest pain, palpitations RESP- denies SOB, cough, wheeze ABD- denies N/V, change in stools, abd pain GU- denies dysuria, hematuria,  dribbling, incontinence MSK- denies joint pain, muscle aches, injury Neuro- denies headache, dizziness, syncope, seizure activity       Objective:    BP 130/76  Pulse 84  Temp(Src) 98.2 F (36.8 C) (Oral)  Resp 14  Ht  (1.626 m)  Wt 200 lb (90.719 kg)  BMI 34.31 kg/m2 GEN- NAD, alert and oriented x3 Psych- well groomed, tearful, not overly anxious, normal speech , no SI or HI       Assessment & Plan:      Problem List Items Addressed This Visit   None      Note: This dictation was prepared with Dragon dictation along with smaller phrase technology. Any transcriptional errors that result from this process are unintentional.

## 2014-06-26 NOTE — Patient Instructions (Signed)
Referral to psychiatry  Increase dose of effexor Use ativan at bedtime I will and check in on you  F/U 8 weeks

## 2014-06-26 NOTE — Assessment & Plan Note (Signed)
She is major depressive disorder as well as mood disorder. I will increase her Effexor to 150 mg she will also start Ativan 1 mg at bedtime. We'll do a new referral to psychiatry she's been on multiple medications in the past she has very poor support network and think she would benefit from some more intensive therapy and counseling. If she deteriorates in any way before this appointment then we will admit her to behavioral health if needed

## 2014-06-27 ENCOUNTER — Telehealth: Payer: Self-pay | Admitting: Family Medicine

## 2014-06-27 NOTE — Telephone Encounter (Signed)
Message copied by Salley Scarlet on Tue Jun 27, 2014  1:17 PM ------      Message from: Elvina Mattes T      Created: Tue Jun 27, 2014 12:50 PM      Regarding: RE: Psych referral needed ASAP, do not send to [facility] or whichever one you scheduled last time       Pt states she is going as walkin to [facility]. First visit needs to be walkin. Says she will go Wednesday and she has been there before. Others is going to cost her 150.00 copay before can be seen.      ----- Message -----         From: Salley Scarlet, MD         Sent: 06/26/2014   2:05 PM           To: Elvina Mattes, CMA      Subject: Psych referral needed ASAP, do not send to C#                   ------

## 2014-06-30 ENCOUNTER — Telehealth: Payer: Self-pay | Admitting: Family Medicine

## 2014-06-30 NOTE — Telephone Encounter (Signed)
I spoke with patient regarding her medications. She was seen by the [facility] she had her intake with the therapist and also saw the psychiatrist she was started on Trileptal 150 mg twice a day but she did not start it yet as she was concerned about her diagnosis. Her coworkers told her today that they have noticed some highs and lows in her mood therefore there is a question of bipolar disorder which the therapist brought up as well. She has followup with psychiatry in 2 weeks. She was concerned about starting the medications I think that she would benefit from trying to Trileptal she still on the Effexor as well. He did want her to come off of the lorazepam but did not give her anything else for anxiety and panic attacks at this time suggest medication she can continue to use this as needed as she starts on the Trileptal I think it will take some time to get her balance out on medications

## 2014-07-12 ENCOUNTER — Telehealth: Payer: Self-pay

## 2014-07-12 NOTE — Telephone Encounter (Signed)
Dr. Patsy Lager or any clinical staff member:  Please call patient with her STD lab results.  She is very upset that she hasn't heard anything.  Cell [phone]

## 2014-07-13 NOTE — Telephone Encounter (Signed)
Was this ever reviewed?

## 2014-07-13 NOTE — Telephone Encounter (Signed)
Called her- LMOM.  I am so sorry if I did not communicate to her, I though I had given here these results on the phone. Her gonorrhea and chlamydia are negative

## 2014-07-17 ENCOUNTER — Telehealth: Payer: Self-pay | Admitting: Family Medicine

## 2014-07-17 NOTE — Telephone Encounter (Signed)
Okay, send to Martie Lee to make sure she has appt with a therapist Continue medications as well Martie Lee can find another therapist if needed

## 2014-07-17 NOTE — Telephone Encounter (Signed)
Patient is calling to let us know that her appointment to [facility] cancelled on her and she would like to talk to you about this  [phone]

## 2014-07-17 NOTE — Telephone Encounter (Signed)
Call placed to patient.   States that she has appointment for psychiatrist in October, but her therapy appointment was cancelled.   States that she feels that therapy would be more beneficial than medication at this time.   Was advised that she could try to do a walk-in appointment, but no other appointments are available at this time. Patient is trying to leave work early to be able to do walk-in appointment.   MD to be made aware.

## 2014-07-19 NOTE — Telephone Encounter (Signed)
lmtrc

## 2014-07-21 ENCOUNTER — Ambulatory Visit: Payer: BC Managed Care – PPO | Admitting: Family Medicine

## 2014-07-24 ENCOUNTER — Ambulatory Visit: Payer: BC Managed Care – PPO | Admitting: Family Medicine

## 2014-07-24 NOTE — Telephone Encounter (Signed)
LMTRC

## 2014-08-02 ENCOUNTER — Encounter: Payer: Self-pay | Admitting: Family Medicine

## 2014-08-02 ENCOUNTER — Ambulatory Visit (INDEPENDENT_AMBULATORY_CARE_PROVIDER_SITE_OTHER): Payer: BC Managed Care – PPO | Admitting: Family Medicine

## 2014-08-02 VITALS — BP 122/60 | HR 82 | Temp 98.2°F | Resp 16 | Ht 64.0 in | Wt 193.0 lb

## 2014-08-02 DIAGNOSIS — F3181 Bipolar II disorder: Secondary | ICD-10-CM | POA: Insufficient documentation

## 2014-08-02 DIAGNOSIS — F411 Generalized anxiety disorder: Secondary | ICD-10-CM | POA: Insufficient documentation

## 2014-08-02 DIAGNOSIS — G4700 Insomnia, unspecified: Secondary | ICD-10-CM

## 2014-08-02 MED ORDER — LORAZEPAM 1 MG PO TABS: 1.0000 mg | ORAL_TABLET | Freq: Two times a day (BID) | ORAL | Status: DC | PRN

## 2014-08-02 NOTE — Patient Instructions (Addendum)
Decrease trazodone to 1/2 tablet  Continue current medications F/U 2 months
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
## 2014-08-02 NOTE — Assessment & Plan Note (Signed)
Will have her decrease the trazodone to 50 mg due to increased daytime somnolence especially she takes this with the gabapentin which helps her hand

## 2014-08-02 NOTE — Assessment & Plan Note (Addendum)
Continue with psychiatry, and we'll not prescribe her the lorazepam however her mood is still in stable this the only thing that helps her anxiety so I will continue to prescribe until she is seen by the psychiatrist next week to get her medications better situated. Note she would like to go back to work part time however needs to be cleared by psychiatry of the [facility] discusses with a psychiatrist next week

## 2014-08-03 NOTE — Telephone Encounter (Signed)
Left another message with pt vm no return call

## 2014-09-23 ENCOUNTER — Emergency Department (HOSPITAL_COMMUNITY)
Admission: EM | Admit: 2014-09-23 | Discharge: 2014-09-24 | Disposition: A | Discharge status: Home / Self Care | Payer: Medicaid Other | Attending: Emergency Medicine | Admitting: Emergency Medicine

## 2014-09-23 ENCOUNTER — Encounter (HOSPITAL_COMMUNITY): Payer: Self-pay | Admitting: Emergency Medicine

## 2014-09-23 DIAGNOSIS — Z79899 Other long term (current) drug therapy: Secondary | ICD-10-CM | POA: Insufficient documentation

## 2014-09-23 DIAGNOSIS — F3181 Bipolar II disorder: Secondary | ICD-10-CM | POA: Insufficient documentation

## 2014-09-23 DIAGNOSIS — K589 Irritable bowel syndrome without diarrhea: Secondary | ICD-10-CM | POA: Diagnosis not present

## 2014-09-23 DIAGNOSIS — R0981 Nasal congestion: Secondary | ICD-10-CM | POA: Diagnosis present

## 2014-09-23 DIAGNOSIS — J0100 Acute maxillary sinusitis, unspecified: Secondary | ICD-10-CM | POA: Diagnosis not present

## 2014-09-23 MED ORDER — TRIAMCINOLONE ACETONIDE 55 MCG/ACT NA AERO
2.0000 | INHALATION_SPRAY | Freq: Every day | NASAL | Status: DC
Start: 2014-09-23 — End: 2014-09-27

## 2014-09-23 MED ORDER — AZITHROMYCIN 250 MG PO TABS
500.0000 mg | ORAL_TABLET | Freq: Once | ORAL | Status: AC
  Administered 2014-09-24: 500 mg via ORAL
  Filled 2014-09-23: qty 2

## 2014-09-23 MED ORDER — AZITHROMYCIN 250 MG PO TABS: 250.0000 mg | ORAL_TABLET | Freq: Every day | ORAL | Status: DC

## 2014-09-23 NOTE — Discharge Instructions (Signed)

## 2014-09-23 NOTE — ED Provider Notes (Signed)
CSN: [id number]     Arrival date & time 09/23/14  2211 History  This chart was scribed for non-physician practitioner working with Layla MawKristen N Ward, DO by Elveria Risingimelie Horne, ED Scribe. This patient was seen in room WTR9/WTR9 and the patient's care was started at 11:42 PM.   Chief Complaint  Patient presents with  . URI   The history is provided by the patient. No language interpreter was used.   HPI Comments: Damaris SchoonerKimberly L Nuncio is a 33 y.o. female who presents to the Emergency Department complaining of cold symptoms persisting for three weeks. Patient reports that three days ago she developed severe nasal congestion with difficulty taking deep breath. Patient reports that her congestion is exacerbated with lying supine. Patient reports use of a humidifier the last fours days. Patient reports attempted treatment with Vicks, Theraful, and Mucinex, but denies relief. Patient is not a smoker but lives with smokers. Patient denies history of asthma or historical use of an inhaler, but shares history of allergies. Patient reports sick contacts home: her child and husband, but their symptoms have resolved.   Past Medical History  Diagnosis Date  . Depression   . Irritable bowel syndrome (IBS)   . Bipolar II disorder    Past Surgical History  Procedure Laterality Date  . Cholecystectomy    . Mouth surgery     Family History  Problem Relation Age of Onset  . Mental illness Mother   . Hyperlipidemia Father   . Cancer Paternal Grandmother     Breast CA   History  Substance Use Topics  . Smoking status: Former Games developermoker  . Smokeless tobacco: Never Used  . Alcohol Use: 1.5 oz/week    3 drink(s) per week     Comment: "at least a 22 oz beer everyday"   OB History    No data available     Review of Systems  Constitutional: Negative for fever and chills.  HENT: Positive for congestion and sinus pressure. Negative for sore throat and trouble swallowing.   Respiratory: Negative for cough and shortness  of breath.   Cardiovascular: Negative for chest pain.  Gastrointestinal: Negative for vomiting and abdominal pain.  Musculoskeletal: Negative for myalgias.  Skin: Negative for rash.  Neurological: Positive for headaches. Negative for weakness.   Allergies  Review of patient's allergies indicates no known allergies.  Home Medications   Prior to Admission medications   Medication Sig Start Date End Date Taking? Authorizing Provider  gabapentin (NEURONTIN) 100 MG capsule Take 1-2 tablets at bedtime 04/19/14  Yes Salley ScarletKawanta F [place], MD  guaiFENesin (MUCINEX) 600 MG 12 hr tablet Take 600 mg by mouth 2 (two) times daily as needed for cough.   Yes Historical Provider, MD  levonorgestrel (MIRENA) 20 MCG/24HR IUD 1 each by Intrauterine route continuous.    Yes Historical Provider, MD  Oxcarbazepine (TRILEPTAL) 300 MG tablet Take 300 mg by mouth 2 (two) times daily.   Yes Historical Provider, MD  Phenyleph-CPM-DM-Aspirin (ALKA-SELTZER PLUS COLD & COUGH PO) Take 1 packet by mouth 2 (two) times daily as needed. For cough and cold symptoms   Yes Historical Provider, MD  traZODone (DESYREL) 100 MG tablet Take 50 mg by mouth at bedtime.    Yes Historical Provider, MD  venlafaxine XR (EFFEXOR XR) 150 MG 24 hr capsule Take 1 capsule (150 mg total) by mouth daily with breakfast. 06/26/14  Yes Salley ScarletKawanta F , MD  CLOBETASOL PROPIONATE E 0.05 % emollient cream APPLY TO AFFECTED AREA ON HANDS  TWICE DAILY AS NEEDED Patient not taking: Reported on 09/23/2014 06/20/14   Salley ScarletKawanta F [place], MD  dicyclomine (BENTYL) 20 MG tablet Take 1 tablet (20 mg total) by mouth every 6 (six) hours. Patient not taking: Reported on 09/23/2014 05/25/14   Salley ScarletKawanta F [place], MD  LORazepam (ATIVAN) 1 MG tablet Take 1 tablet (1 mg total) by mouth 2 (two) times daily as needed for anxiety. Patient not taking: Reported on 09/23/2014 08/02/14   Salley ScarletKawanta F [place], MD   Triage Vitals: BP 126/85 mmHg  Pulse 67  Temp(Src) 98 F (36.7 C) (Oral)   Resp 18  Ht 5\' 3"  (1.6 m)  Wt 190 lb (86.183 kg)  BMI 33.67 kg/m2  SpO2 100% Physical Exam  Constitutional: She is oriented to person, place, and time. She appears well-developed and well-nourished. No distress.  HENT:  Head: Normocephalic and atraumatic.  Significantly swollen nasal mucosa that is red. Maxillary sinus tenderness. Oropharynx benign. No anterior adenopathy.   Eyes: EOM are normal.  Neck: Neck supple. No tracheal deviation present.  Cardiovascular: Normal rate and regular rhythm.   No murmur heard. Pulmonary/Chest: Effort normal and breath sounds normal. No respiratory distress. She has no wheezes. She has no rales. She exhibits no tenderness.  Musculoskeletal: Normal range of motion.  Neurological: She is alert and oriented to person, place, and time.  Skin: Skin is warm and dry.  Psychiatric: She has a normal mood and affect. Her behavior is normal.  Nursing note and vitals reviewed.   ED Course  Procedures (including critical care time)  COORDINATION OF CARE: 11:45 PM- Will prescribe nasal steroid. Patient advised to continue treating with Mucinex and OTC decongestants. Discussed treatment plan with patient at bedside and patient agreed to plan.   Labs Review Labs Reviewed - No data to display  Imaging Review No results found.   EKG Interpretation None      MDM   Final diagnoses:  None    1. Maxillary sinusitis  Abx, other supportive care (nasal steroids, anti-inflammatories. She is well appearing and appropriate for discharge.   I personally performed the services described in this documentation, which was scribed in my presence. The recorded information has been reviewed and is accurate.    Arnoldo HookerShari A Emslee Lopezmartinez, PA-C 09/27/14 2328  Layla MawKristen N Ward, DO 09/28/14 0111

## 2014-09-23 NOTE — ED Notes (Signed)
Pt presents with cold s/s x 3 weeks, 3 days ago began having severe nasal congestion with difficulty taking deep breath.

## 2014-09-27 ENCOUNTER — Telehealth: Payer: Self-pay | Admitting: Family Medicine

## 2014-09-27 MED ORDER — FLUTICASONE PROPIONATE 50 MCG/ACT NA SUSP: 2.0000 | Freq: Every day | NASAL | Status: DC

## 2014-09-27 NOTE — Telephone Encounter (Signed)
[phone]  PT is calling because she went to ER and was given a nose spray and it is expensive and she would like to have something else can you please call pt to help her with this. It was an over the counter medication

## 2014-09-27 NOTE — Telephone Encounter (Signed)
Call placed to patient. LMTRC.  

## 2014-09-27 NOTE — Telephone Encounter (Signed)
Patient was given Nasocort.   MD please advise.

## 2014-09-27 NOTE — Telephone Encounter (Signed)
switch to flonase, but all of the nasal sprays are OTC now, it may be cheaper OTC

## 2014-09-28 NOTE — Telephone Encounter (Signed)
Patient returned call and made aware.   Patient states that she has been using Afrin with some relief.   States that she has some sinus pressure that remains on bridge of nares.   States that she is going to start using nasal saline and sudafed to try to relieve pressure.   Recommended to keep appointment with MD to F/U.

## 2014-09-28 NOTE — Telephone Encounter (Signed)
Call placed to patient. No answer. No VM.  

## 2014-09-29 NOTE — Telephone Encounter (Signed)
noted 

## 2014-10-02 ENCOUNTER — Ambulatory Visit (INDEPENDENT_AMBULATORY_CARE_PROVIDER_SITE_OTHER): Payer: Medicaid Other | Admitting: Family Medicine

## 2014-10-02 ENCOUNTER — Encounter: Payer: Self-pay | Admitting: Family Medicine

## 2014-10-02 ENCOUNTER — Emergency Department (HOSPITAL_COMMUNITY)
Admission: EM | Admit: 2014-10-02 | Discharge: 2014-10-02 | Disposition: A | Discharge status: Home / Self Care | Payer: Medicaid Other | Attending: Emergency Medicine | Admitting: Emergency Medicine

## 2014-10-02 ENCOUNTER — Encounter (HOSPITAL_COMMUNITY): Payer: Self-pay | Admitting: Emergency Medicine

## 2014-10-02 VITALS — BP 128/72 | HR 82 | Temp 98.6°F | Resp 14 | Ht 63.0 in | Wt 193.0 lb

## 2014-10-02 DIAGNOSIS — K589 Irritable bowel syndrome without diarrhea: Secondary | ICD-10-CM | POA: Insufficient documentation

## 2014-10-02 DIAGNOSIS — Z760 Encounter for issue of repeat prescription: Secondary | ICD-10-CM | POA: Insufficient documentation

## 2014-10-02 DIAGNOSIS — Z7951 Long term (current) use of inhaled steroids: Secondary | ICD-10-CM | POA: Diagnosis not present

## 2014-10-02 DIAGNOSIS — Z79899 Other long term (current) drug therapy: Secondary | ICD-10-CM | POA: Insufficient documentation

## 2014-10-02 DIAGNOSIS — F489 Nonpsychotic mental disorder, unspecified: Secondary | ICD-10-CM

## 2014-10-02 DIAGNOSIS — Z7289 Other problems related to lifestyle: Secondary | ICD-10-CM | POA: Insufficient documentation

## 2014-10-02 DIAGNOSIS — Z87891 Personal history of nicotine dependence: Secondary | ICD-10-CM | POA: Insufficient documentation

## 2014-10-02 DIAGNOSIS — F3181 Bipolar II disorder: Secondary | ICD-10-CM

## 2014-10-02 DIAGNOSIS — R45851 Suicidal ideations: Secondary | ICD-10-CM | POA: Diagnosis not present

## 2014-10-02 MED ORDER — OXCARBAZEPINE 300 MG PO TABS: 300.0000 mg | ORAL_TABLET | Freq: Two times a day (BID) | ORAL | Status: DC

## 2014-10-02 MED ORDER — VENLAFAXINE HCL ER 150 MG PO CP24
150.0000 mg | ORAL_CAPSULE | Freq: Every day | ORAL | Status: DC
Start: 2014-10-02 — End: 2016-08-22

## 2014-10-02 NOTE — Patient Instructions (Signed)
[facility]

## 2014-10-02 NOTE — Discharge Instructions (Signed)
Medication Refill, Emergency Department °We have refilled your medication today as a courtesy to you. It is best for your medical care, however, to take care of getting refills done through your primary caregiver's office. They have your records and can do a better job of follow-up than we can in the emergency department. °On maintenance medications, we often only prescribe enough medications to get you by until you are able to see your regular caregiver. This is a more expensive way to refill medications. °In the future, please plan for refills so that you will not have to use the emergency department for this. °Thank you for your help. Your help allows us to better take care of the daily emergencies that enter our department. °Document Released: 01/30/2004 Document Revised: 01/05/2012 Document Reviewed: 01/20/2014 °ExitCare® Patient Information ©2015 [employer]. This information is not intended to replace advice given to you by your health care provider. Make sure you discuss any questions you have with your health care provider. ° °
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
## 2014-10-02 NOTE — ED Provider Notes (Signed)
CSN: [id number]     Arrival date & time 10/02/14  1404 History   First MD Initiated Contact with Patient 10/02/14 1538     Chief Complaint  Patient presents with  . self mutilation      HPI Pt reports mood instability and inappropriate dosing of her medications at her outpatient psych center.  There has been issues with pharmacy correcting these mistakes. Pt had cutting behavior last night. Sent to ER by pcp. Pt denies SI and HI. Pt is a Associate Professorpharmacy tech. No hallucinations. Calm and cooperative during the history. No other complaints   Past Medical History  Diagnosis Date  . Depression   . Irritable bowel syndrome (IBS)   . Bipolar II disorder    Past Surgical History  Procedure Laterality Date  . Cholecystectomy    . Mouth surgery     Family History  Problem Relation Age of Onset  . Mental illness Mother   . Hyperlipidemia Father   . Cancer Paternal Grandmother     Breast CA   History  Substance Use Topics  . Smoking status: Former Games developermoker  . Smokeless tobacco: Never Used  . Alcohol Use: 1.5 oz/week    3 drink(s) per week     Comment: "at least a 22 oz beer everyday"   OB History    No data available     Review of Systems  All other systems reviewed and are negative.     Allergies  Review of patient's allergies indicates no known allergies.  Home Medications   Prior to Admission medications   Medication Sig Start Date End Date Taking? Authorizing Provider  CLOBETASOL PROPIONATE E 0.05 % emollient cream APPLY TO AFFECTED AREA ON HANDS TWICE DAILY AS NEEDED 06/20/14   Salley ScarletKawanta F [place], MD  dicyclomine (BENTYL) 20 MG tablet Take 1 tablet (20 mg total) by mouth every 6 (six) hours. 05/25/14   Salley ScarletKawanta F [place], MD  fluticasone (FLONASE) 50 MCG/ACT nasal spray Place 2 sprays into both nostrils daily. 09/27/14   Salley ScarletKawanta F [place], MD  gabapentin (NEURONTIN) 100 MG capsule Take 1-2 tablets at bedtime 04/19/14   Salley ScarletKawanta F [place], MD  guaiFENesin (MUCINEX) 600 MG 12 hr tablet  Take 600 mg by mouth 2 (two) times daily as needed for cough.    Historical Provider, MD  levonorgestrel (MIRENA) 20 MCG/24HR IUD 1 each by Intrauterine route continuous.     Historical Provider, MD  LORazepam (ATIVAN) 1 MG tablet Take 1 tablet (1 mg total) by mouth 2 (two) times daily as needed for anxiety. 08/02/14   Salley ScarletKawanta F [place], MD  Oxcarbazepine (TRILEPTAL) 300 MG tablet Take 1 tablet (300 mg total) by mouth 2 (two) times daily. 10/02/14   Lyanne CoKevin M Drena [place], MD  Phenyleph-CPM-DM-Aspirin (ALKA-SELTZER PLUS COLD & COUGH PO) Take 1 packet by mouth 2 (two) times daily as needed. For cough and cold symptoms    Historical Provider, MD  traZODone (DESYREL) 100 MG tablet Take 50 mg by mouth at bedtime.     Historical Provider, MD  venlafaxine XR (EFFEXOR-XR) 150 MG 24 hr capsule Take 1 capsule (150 mg total) by mouth daily with breakfast. 10/02/14   Lyanne CoKevin M [place], MD   BP 133/98 mmHg  Pulse 92  Temp(Src) 98.1 F (36.7 C) (Oral)  Resp 18  SpO2 100% Physical Exam  Constitutional: She is oriented to person, place, and time. She appears well-developed and well-nourished.  HENT:  Head: Normocephalic.  Eyes: EOM are normal.  Neck: Normal range  of motion.  Pulmonary/Chest: Effort normal.  Abdominal: She exhibits no distension.  Musculoskeletal: Normal range of motion.  Neurological: She is alert and oriented to person, place, and time.  Psychiatric: She has a normal mood and affect.  Nursing note and vitals reviewed.   ED Course  Procedures (including critical care time) Labs Review Labs Reviewed - No data to display  Imaging Review No results found.   EKG Interpretation None      MDM   Final diagnoses:  Medication refill    Pt with good insight. Not a threat to herself or others. Will prescribe her home meds for her. Outpatient psych follow up.     Lyanne CoKevin M Markan Cazarez, MD 10/02/14 [phone]

## 2014-10-02 NOTE — Assessment & Plan Note (Addendum)
Uncontrolled bipolar 2 disorder with mostly depression. She also has anger issues and mood problems. I'm concerned about her cutting and not being on appropriate medications. I do not trust that she can follow-up with her psychiatrist in a timely manner therefore she is being sent to the emergency room to be evaluated by theTele psychiatrist and the ACT Team  Of note her childs father was present today actually spoke to him directly she wants to wait until 2:00 so that she can see her child after school and she knows that she will need to be admitted his name is Cruzita LedererJay Austin and his phone number is [phone]. He advised me that he will take her to the hospital after she goes to see her child at school which will be at 2:00pm
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
## 2014-10-04 ENCOUNTER — Telehealth: Payer: Self-pay | Admitting: Family Medicine

## 2014-10-04 DIAGNOSIS — Z7289 Other problems related to lifestyle: Secondary | ICD-10-CM

## 2014-10-04 DIAGNOSIS — F3181 Bipolar II disorder: Secondary | ICD-10-CM

## 2014-10-04 NOTE — Telephone Encounter (Signed)
I spoke with patient I have called her for the past 2 days since she was at the hospital worse and her secondary to suicidal ideation as well as cutting and uncontrolled mood. She was seen by the ER physician she states and then she was interviewed on the phone by psychiatrist they think that she may actually be bipolar 1 but wanted to just keep her on the extended release venlafaxine and increase her Trileptal back to 300 twice a day she was not an imminent threat at that time when she was being interviewed here for refill she can follow-up as an outpatient. She is at home now states that she is doing okay she does have her new medication she's not had any cutting since she went to the hospital. I'm trying to get her new psychiatrist and will follow her more closely she is in agreement with this. She will continue to see her therapist on a regular basis. She understands to call me for any concerns or go back to the hospital she has any thoughts of hurting herself.

## 2014-11-20 ENCOUNTER — Encounter: Payer: Self-pay | Admitting: Family Medicine

## 2014-12-04 ENCOUNTER — Emergency Department (HOSPITAL_COMMUNITY)
Admission: EM | Admit: 2014-12-04 | Discharge: 2014-12-04 | Disposition: A | Discharge status: Home / Self Care | Payer: Medicaid Other | Attending: Emergency Medicine | Admitting: Emergency Medicine

## 2014-12-04 ENCOUNTER — Encounter (HOSPITAL_COMMUNITY): Payer: Self-pay

## 2014-12-04 ENCOUNTER — Emergency Department (HOSPITAL_COMMUNITY): Payer: Medicaid Other

## 2014-12-04 DIAGNOSIS — Z7951 Long term (current) use of inhaled steroids: Secondary | ICD-10-CM | POA: Diagnosis not present

## 2014-12-04 DIAGNOSIS — K589 Irritable bowel syndrome without diarrhea: Secondary | ICD-10-CM | POA: Diagnosis not present

## 2014-12-04 DIAGNOSIS — Y998 Other external cause status: Secondary | ICD-10-CM | POA: Insufficient documentation

## 2014-12-04 DIAGNOSIS — F3181 Bipolar II disorder: Secondary | ICD-10-CM | POA: Insufficient documentation

## 2014-12-04 DIAGNOSIS — Z79899 Other long term (current) drug therapy: Secondary | ICD-10-CM | POA: Diagnosis not present

## 2014-12-04 DIAGNOSIS — S4992XA Unspecified injury of left shoulder and upper arm, initial encounter: Secondary | ICD-10-CM | POA: Insufficient documentation

## 2014-12-04 DIAGNOSIS — Y9389 Activity, other specified: Secondary | ICD-10-CM | POA: Diagnosis not present

## 2014-12-04 DIAGNOSIS — M25512 Pain in left shoulder: Secondary | ICD-10-CM

## 2014-12-04 DIAGNOSIS — Y9289 Other specified places as the place of occurrence of the external cause: Secondary | ICD-10-CM | POA: Insufficient documentation

## 2014-12-04 DIAGNOSIS — Z87891 Personal history of nicotine dependence: Secondary | ICD-10-CM | POA: Diagnosis not present

## 2014-12-04 MED ORDER — KETOROLAC TROMETHAMINE 60 MG/2ML IM SOLN
30.0000 mg | Freq: Once | INTRAMUSCULAR | Status: AC
  Administered 2014-12-04: 30 mg via INTRAMUSCULAR
  Filled 2014-12-04: qty 2

## 2014-12-04 MED ORDER — NAPROXEN 500 MG PO TABS: 500.0000 mg | ORAL_TABLET | Freq: Two times a day (BID) | ORAL | Status: DC

## 2014-12-04 NOTE — Discharge Instructions (Signed)
Shoulder Pain °The shoulder is the joint that connects your arms to your body. The bones that form the shoulder joint include the upper arm bone (humerus), the shoulder blade (scapula), and the collarbone (clavicle). The top of the humerus is shaped like a ball and fits into a rather flat socket on the scapula (glenoid cavity). A combination of muscles and strong, fibrous tissues that connect muscles to bones (tendons) support your shoulder joint and hold the ball in the socket. Small, fluid-filled sacs (bursae) are located in different areas of the joint. They act as cushions between the bones and the overlying soft tissues and help reduce friction between the gliding tendons and the bone as you move your arm. Your shoulder joint allows a wide range of motion in your arm. This range of motion allows you to do things like scratch your back or throw a ball. However, this range of motion also makes your shoulder more prone to pain from overuse and injury. °Causes of shoulder pain can originate from both injury and overuse and usually can be grouped in the following four categories: °· Redness, swelling, and pain (inflammation) of the tendon (tendinitis) or the bursae (bursitis). °· Instability, such as a dislocation of the joint. °· Inflammation of the joint (arthritis). °· Broken bone (fracture). °HOME CARE INSTRUCTIONS  °· Apply ice to the sore area. °· Put ice in a plastic bag. °· Place a towel between your skin and the bag. °· Leave the ice on for 15-20 minutes, 3-4 times per day for the first 2 days, or as directed by your health care provider. °· Stop using cold packs if they do not help with the pain. °· If you have a shoulder sling or immobilizer, wear it as long as your caregiver instructs. Only remove it to shower or bathe. Move your arm as little as possible, but keep your hand moving to prevent swelling. °· Squeeze a soft ball or foam pad as much as possible to help prevent swelling. °· Only take  over-the-counter or prescription medicines for pain, discomfort, or fever as directed by your caregiver. °SEEK MEDICAL CARE IF:  °· Your shoulder pain increases, or new pain develops in your arm, hand, or fingers. °· Your hand or fingers become cold and numb. °· Your pain is not relieved with medicines. °SEEK IMMEDIATE MEDICAL CARE IF:  °· Your arm, hand, or fingers are numb or tingling. °· Your arm, hand, or fingers are significantly swollen or turn white or blue. °MAKE SURE YOU:  °· Understand these instructions. °· Will watch your condition. °· Will get help right away if you are not doing well or get worse. °Document Released: 07/23/2005 Document Revised: 02/27/2014 Document Reviewed: 09/27/2011 °Exit[employer]® Patient Information ©2015 [employer]. This information is not intended to replace advice given to you by your health care provider. Make sure you discuss any questions you have with your health care provider. °Shoulder Range of Motion Exercises °The shoulder is the most flexible joint in the human body. Because of this it is also the most unstable joint in the body. All ages can develop shoulder problems. Early treatment of problems is necessary for a good outcome. People react to shoulder pain by decreasing the movement of the joint. After a brief period of time, the shoulder can become "frozen". This is an almost complete loss of the ability to move the damaged shoulder. Following injuries your caregivers can give you instructions on exercises to keep your range of motion (ability to   move your shoulder freely), or regain it if it has been lost.  °EXERCISES °EXERCISES TO MAINTAIN THE MOBILITY OF YOUR SHOULDER: °Codman's Exercise or Pendulum Exercise °· This exercise may be performed in a prone (face-down) lying position or standing while leaning on a chair with the opposite arm. Its purpose is to relax the muscles in your shoulder and slowly but surely increase the range of motion and to relieve  pain. °¨ Lie on your stomach close to the side edge of the bed. Let your weak arm hang over the edge of the bed. Relax your shoulder, arm and hand. Let your shoulder blade relax and drop down. °¨ Slowly and gently swing your arm forward and back. Do not use your neck muscles; relax them. It might be easier to have someone else gently start swinging your arm. °¨ As pain decreases, increase your swing. To start, arm swing should begin at 15 degree angles. In time and as pain lessens, move to 30-45 degree angles. Start with swinging for about 15 seconds, and work towards swinging for 3 to 5 minutes. °· This exercise may also be performed in a standing/bent over position. °¨ Stand and hold onto a sturdy chair with your good arm. Bend forward at the waist and bend your knees slightly to help protect your back. Relax your weak arm, let it hang limp. Relax your shoulder blade and let it drop. °¨ Keep your shoulder relaxed and use body motion to swing your arm in small circles. °¨ Stand up tall and relax. °¨ Repeat motion and change direction of circles. °¨ Start with swinging for about 30 seconds, and work towards swinging for 3 to 5 minutes. °STRETCHING EXERCISES: °· Lift your arm out in front of you with the elbow bent at 90 degrees. Using your other arm gently pull the elbow forward and across your body. °· Bend one arm behind you with the palm facing outward. Using the other arm, hold a towel or rope and reach this arm up above your head, then bend it at the elbow to move your wrist to behind your neck. Grab the free end of the towel with the hand behind your back. Gently pull the towel up with the hand behind your neck, gradually increasing the pull on the hand behind the small of your back. Then, gradually pull down with the hand behind the small of your back. This will pull the hand and arm behind your neck further. Both shoulders will have an increased range of motion with repetition of this  exercise. °STRENGTHENING EXERCISES: °· Standing with your arm at your side and straight out from your shoulder with the elbow bent at 90 degrees, hold onto a small weight and slowly raise your hand so it points straight up in the air. Repeat this five times to begin with, and gradually increase to ten times. Do this four times per day. As you grow stronger you can gradually increase the weight. °· Repeat the above exercise, only this time using an elastic band. Start with your hand up in the air and pull down until your hand is by your side. As you grow stronger, gradually increase the amount you pull by increasing the number or size of the elastic bands. Use the same amount of repetitions. °· Standing with your hand at your side and holding onto a weight, gradually lift the hand in front of you until it is over your head. Do the same also with the hand remaining at   your side and lift the hand away from your body until it is again over your head. Repeat this five times to begin with, and gradually increase to ten times. Do this four times per day. As you grow stronger you can gradually increase the weight. °Document Released: 07/12/2003 Document Revised: 10/18/2013 Document Reviewed: 10/13/2005 °ExitCare® Patient Information ©2015 [employer]. This information is not intended to replace advice given to you by your health care provider. Make sure you discuss any questions you have with your health care provider. ° °
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
## 2014-12-25 ENCOUNTER — Encounter: Payer: Self-pay | Admitting: Family Medicine

## 2015-01-08 ENCOUNTER — Other Ambulatory Visit: Payer: Medicaid Other

## 2015-01-08 ENCOUNTER — Other Ambulatory Visit: Payer: Self-pay | Admitting: Nurse Practitioner

## 2015-01-08 DIAGNOSIS — N63 Unspecified lump in unspecified breast: Secondary | ICD-10-CM

## 2015-01-08 DIAGNOSIS — R102 Pelvic and perineal pain: Secondary | ICD-10-CM

## 2015-01-11 ENCOUNTER — Other Ambulatory Visit: Payer: Medicaid Other

## 2015-01-11 ENCOUNTER — Ambulatory Visit
Admission: RE | Admit: 2015-01-11 | Discharge: 2015-01-11 | Disposition: A | Discharge status: Home / Self Care | Payer: Medicaid Other | Source: Ambulatory Visit | Attending: Nurse Practitioner | Admitting: Nurse Practitioner

## 2015-01-11 DIAGNOSIS — R102 Pelvic and perineal pain: Secondary | ICD-10-CM

## 2015-01-12 ENCOUNTER — Ambulatory Visit
Admission: RE | Admit: 2015-01-12 | Discharge: 2015-01-12 | Disposition: A | Discharge status: Home / Self Care | Payer: Medicaid Other | Source: Ambulatory Visit | Attending: Nurse Practitioner | Admitting: Nurse Practitioner

## 2015-01-12 ENCOUNTER — Encounter (INDEPENDENT_AMBULATORY_CARE_PROVIDER_SITE_OTHER): Payer: Self-pay

## 2015-01-12 DIAGNOSIS — N63 Unspecified lump in unspecified breast: Secondary | ICD-10-CM

## 2015-01-25 ENCOUNTER — Encounter: Payer: Self-pay | Admitting: Family Medicine

## 2015-06-19 ENCOUNTER — Encounter (HOSPITAL_COMMUNITY): Payer: Self-pay

## 2015-06-19 ENCOUNTER — Emergency Department (HOSPITAL_COMMUNITY)
Admission: EM | Admit: 2015-06-19 | Discharge: 2015-06-19 | Disposition: A | Discharge status: Home / Self Care | Payer: Medicaid Other | Attending: Emergency Medicine | Admitting: Emergency Medicine

## 2015-06-19 DIAGNOSIS — F319 Bipolar disorder, unspecified: Secondary | ICD-10-CM | POA: Diagnosis not present

## 2015-06-19 DIAGNOSIS — Z8719 Personal history of other diseases of the digestive system: Secondary | ICD-10-CM | POA: Insufficient documentation

## 2015-06-19 DIAGNOSIS — Z87891 Personal history of nicotine dependence: Secondary | ICD-10-CM | POA: Diagnosis not present

## 2015-06-19 DIAGNOSIS — R51 Headache: Secondary | ICD-10-CM | POA: Insufficient documentation

## 2015-06-19 DIAGNOSIS — Z3202 Encounter for pregnancy test, result negative: Secondary | ICD-10-CM | POA: Diagnosis not present

## 2015-06-19 DIAGNOSIS — G44209 Tension-type headache, unspecified, not intractable: Secondary | ICD-10-CM

## 2015-06-19 DIAGNOSIS — Z79899 Other long term (current) drug therapy: Secondary | ICD-10-CM | POA: Diagnosis not present

## 2015-06-19 LAB — I-STAT BETA HCG BLOOD, ED (MC, WL, AP ONLY)

## 2015-06-19 MED ORDER — SODIUM CHLORIDE 0.9 % IV BOLUS (SEPSIS)
1000.0000 mL | Freq: Once | INTRAVENOUS | Status: AC
  Administered 2015-06-19: 1000 mL via INTRAVENOUS

## 2015-06-19 MED ORDER — METOCLOPRAMIDE HCL 5 MG/ML IJ SOLN
10.0000 mg | Freq: Once | INTRAMUSCULAR | Status: AC
  Administered 2015-06-19: 10 mg via INTRAVENOUS
  Filled 2015-06-19: qty 2

## 2015-06-19 MED ORDER — KETOROLAC TROMETHAMINE 30 MG/ML IJ SOLN
30.0000 mg | Freq: Once | INTRAMUSCULAR | Status: AC
  Administered 2015-06-19: 30 mg via INTRAVENOUS
  Filled 2015-06-19: qty 1

## 2015-06-19 NOTE — ED Notes (Signed)
Pov c/o 4 day hx HA and documented htn via health department.

## 2015-06-19 NOTE — ED Notes (Signed)
Pt alert x4 respirations easy non labored. Discuss importance of follow up on bp. Pt agreeable.

## 2015-06-19 NOTE — ED Notes (Signed)
MD at bedside. 

## 2015-06-19 NOTE — Discharge Instructions (Signed)
Return without fail for worsening symptoms, including worsening pain, fevers, vomiting unable to keep down food or fluids, or any other symptoms concerning to you. Your blood pressure does not require acute treatment today. Please establish care with a primary care physician to follow your blood pressure in an outpatient setting.   General Headache Without Cause A general headache is pain or discomfort felt around the head or neck area. The cause may not be found.  HOME CARE   Keep all doctor visits.  Only take medicines as told by your doctor.  Lie down in a dark, quiet room when you have a headache.  Keep a journal to find out if certain things bring on headaches. For example, write down:  What you eat and drink.  How much sleep you get.  Any change to your diet or medicines.  Relax by getting a massage or doing other relaxing activities.  Put ice or heat packs on the head and neck area as told by your doctor.  Lessen stress.  Sit up straight. Do not tighten (tense) your muscles.  Quit smoking if you smoke.  Lessen how much alcohol you drink.  Lessen how much caffeine you drink, or stop drinking caffeine.  Eat and sleep on a regular schedule.  Get 7 to 9 hours of sleep, or as told by your doctor.  Keep lights dim if bright lights bother you or make your headaches worse. GET HELP RIGHT AWAY IF:   Your headache becomes really bad.  You have a fever.  You have a stiff neck.  You have trouble seeing.  Your muscles are weak, or you lose muscle control.  You lose your balance or have trouble walking.  You feel like you will pass out (faint), or you pass out.  You have really bad symptoms that are different than your first symptoms.  You have problems with the medicines given to you by your doctor.  Your medicines do not work.  Your headache feels different than the other headaches.  You feel sick to your stomach (nauseous) or throw up (vomit). MAKE SURE  YOU:   Understand these instructions.  Will watch your condition.  Will get help right away if you are not doing well or get worse. Document Released: 07/22/2008 Document Revised: 01/05/2012 Document Reviewed: 10/03/2011 [facility] Patient Information 2015 [place], [place]. This information is not intended to replace advice given to you by your health care provider. Make sure you discuss any questions you have with your health care provider.   Emergency Department Resource Guide 1) Find a Doctor and Pay Out of Pocket Although you won't have to find out who is covered by your insurance plan, it is a good idea to ask around and get recommendations. You will then need to call the office and see if the doctor you have chosen will accept you as a new patient and what types of options they offer for patients who are self-pay. Some doctors offer discounts or will set up payment plans for their patients who do not have insurance, but you will need to ask so you aren't surprised when you get to your appointment.  2) Contact Your Local Health Department Not all health departments have doctors that can see patients for sick visits, but many do, so it is worth a call to see if yours does. If you don't know where your local health department is, you can check in your phone book. The CDC also has a tool to help  you locate your state's health department, and many state websites also have listings of all of their local health departments.  3) Find a [facility] If your illness is not likely to be very severe or complicated, you may want to try a walk in clinic. These are popping up all over the country in pharmacies, drugstores, and shopping centers. They're usually staffed by nurse practitioners or physician assistants that have been trained to treat common illnesses and complaints. They're usually fairly quick and inexpensive. However, if you have serious medical issues or chronic medical problems, these are  probably not your best option.  No Primary Care Doctor: - Call Health Connect at  [phone] - they can help you locate a primary care doctor that  accepts your insurance, provides certain services, etc. - Physician Referral Service- [phone]  Chronic Pain Problems: Organization         Address  Phone   Notes  [facility]  [phone] Patients need to be referred by their primary care doctor.   Medication Assistance: Organization         Address  Phone   Notes  [facility] Medication [facility] [place] [place], [place] [address] [phone] --Must be a resident of [facility] -- Must have NO insurance coverage whatsoever (no Medicaid/ Medicare, etc.) -- The pt. MUST have a primary care doctor that directs their care regularly and follows them in the community   [facility]  [phone]   [employer]  [phone]    Agencies that provide inexpensive medical care: Organization         Address  Phone   Notes  [place]  [phone]   [facility]    [phone]   [facility] [place], [place] [address] [phone]   [place] [address], [place] [phone]   [facility]anned Parenthood    [phone]   [facility]    [phone]   [place] and [place] [address], [place] Phone:  [phone], Fax:  [phone] Hours of Operation:  9 am - 6 pm, M-F.  Also accepts Medicaid/Medicare and self-pay.  [facility] for [place] [address], [address], [place] Phone: [phone], Fax: [phone]. Hours of Operation:  8:30 am - 5:30 pm, M-F.  Also accepts Medicaid and self-pay.  [facility] [place] [address], [place] Phone: [phone]   [place], [place], [place] [phone], Ext. [phone] Mondays & Thursdays:  7-9 AM.  First 15 patients are seen on a first come, first serve basis.    [place] Providers:  Organization         Address  Phone   Notes  [facility] [address], [place] [phone] Also accepts self-pay patients.  [facility] [address], [place]  [phone]   [place], [address], [place] [phone]   [facility] [address] [place] [phone]   Lucianne Lei [address], [place]   [phone] Only accepts [place] Access [place] patients after they have their name applied to their card.   Self-Pay (no insurance) in [facility]:  Organization  Address  Phone   Notes  Sickle Cell Patients, [facility] [place] [phone]   [facility] [place] [phone]   [facility] [place]  [place], [address], [place] [phone]   [facility]/Dr. Osei-Bonsu  [address], [place] [address], [place] [phone] Phone number for both [place] and [place] locations is the same.  [facility] [address], [place] [phone]   [facility] [address], [place] or [address] [phone]   [facility] [address] [place] [phone], phone; [phone], fax Sees patients 1st and 3rd Saturday of every month.  Must not qualify for public or private insurance (i.e. Medicaid, Medicare, [place] Health Choice, Veterans' Benefits)  Household income should be no more than 200% of the poverty level The clinic cannot treat you if you are pregnant or think you are pregnant  Sexually transmitted diseases are not treated at the clinic.     Dental Care: Organization         Address  Phone  Notes  [facility] Department of [facility] [place] [phone] Accepts children up to age 1 who are enrolled in [place] or [place]; pregnant women with a Medicaid card; and children who have applied for Medicaid or [place] Health Choice, but were declined, whose parents can pay a reduced fee at time of service.  [facility] Department of [facility]  [address], [place] [phone] Accepts children up to age 2 who are enrolled in [place] or [place]; pregnant women with a Medicaid card; and children who have applied for Medicaid or [place] Health Choice, but were declined, whose parents can pay a reduced fee at time of service.  [place] Adult Dental Access PROGRAM  [place] [phone] Patients are seen by appointment only. Walk-ins are not accepted. [place] will see patients 86 years of age and older. Monday - Tuesday (8am-5pm) Most Wednesdays (8:30-5pm) $30 per visit, cash only  [facility] Adult Dental Access PROGRAM  [address], [facility] [phone] Patients are seen by appointment only. Walk-ins are not accepted. [place] will see patients 56 years of age and older. One Wednesday Evening (Monthly: Volunteer Based).  $30 per visit, cash only  [place]  [phone] for adults; Children under age 94, call Graduate Pediatric Dentistry at [phone]. Children aged 26-14, please call [phone] to request a pediatric application.  Dental services are provided in all areas of dental care including fillings, crowns and bridges, complete and partial dentures, implants, gum treatment, root canals, and extractions. Preventive care is also provided. Treatment is provided to both adults and children. Patients are selected via a lottery and there is often a waiting  list.   [facility] [address], [place]  [phone] [email or web address]   [facility] [address] [place], [place] [phone], Ext. [phone] Second and Fourth Thursday of each month, opens at 6:30 AM; Clinic ends at 9 AM.  Patients are seen on a first-come first-served basis, and a limited number are seen during each clinic.   [facility]  [address] [place], [place]  [place], [place] [phone]   Eligibility Requirements You must have lived in [place], [place], or [place] counties for at least the last three months.   You cannot be eligible for state or federal sponsored [place], including [employer], [place], or [employer].   You generally cannot be eligible for healthcare insurance through your employer.    How to apply: Eligibility screenings are held every Tuesday and Wednesday afternoon from 1:00 pm until 4:00 pm. You do not need an appointment for the interview!  [place] [address] [place], [place] [phone]   [facility] Health Department  [phone]   [facility] Health Department  [phone]   [facility] Health Department  [phone]    Behavioral Health Resources in the Community: Intensive Outpatient Programs Organization         Address  Phone  Notes  [facility] [address] [place], [place] [phone]   [facility] [address] [place], [place] [phone]   [facility] [address] [place], [place]  [phone]   [facility] Mental Health [address],  [place], [place] [phone] or [phone]
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
## 2015-06-19 NOTE — ED Provider Notes (Signed)
CSN: [id number]     Arrival date & time 06/19/15  0908 History   First MD Initiated Contact with Patient 06/19/15 0913     Chief Complaint  Patient presents with  . Hypertension    (Consider location/radiation/quality/duration/timing/severity/associated sxs/prior Treatment) HPI 34 year old female with history of depression and bipolar disorder who presents with headache and high blood pressure. Patient reports that she was at the health department a few days ago for a pelvic exam, was told that she had elevated blood pressures. She was told to come to the emergency department if she developed persistent headaches. She reports that at the health Department her blood pressure was 140s over 90s. She reports headache for the past 4 days, initially mild in nature. Pain has gradually worsened over the course of these past few days. It is bifrontal, associated with mild photophobia. Denies nausea, vomiting, vision changes, speech changes, focal weakness or numbness, neck pain or stiffness, fevers or chills, URI symptoms, or difficulty walking. She reports having migrainous-type headaches in the past, but typically they do not last for this long. He has tried taking Goody powder and ibuprofen, with no good effect.  Past Medical History  Diagnosis Date  . Depression   . Irritable bowel syndrome (IBS)   . Bipolar II disorder    Past Surgical History  Procedure Laterality Date  . Cholecystectomy    . Mouth surgery     Family History  Problem Relation Age of Onset  . Mental illness Mother   . Hyperlipidemia Father   . Cancer Paternal Grandmother     Breast CA   Social History  Substance Use Topics  . Smoking status: Former Games developer  . Smokeless tobacco: Never Used  . Alcohol Use: 1.5 oz/week    3 Standard drinks or equivalent per week     Comment: social   OB History    Gravida Para Term Preterm AB TAB SAB Ectopic Multiple Living   2 2             Review of Systems 10/14 systems  reviewed and are negative other than those stated in the HPI   Allergies  Review of patient's allergies indicates no known allergies.  Home Medications   Prior to Admission medications   Medication Sig Start Date End Date Taking? Authorizing Provider  acetaminophen (TYLENOL) 500 MG tablet Take 500 mg by mouth every 6 (six) hours as needed.   Yes Historical Provider, MD  ibuprofen (ADVIL,MOTRIN) 400 MG tablet Take 400 mg by mouth every 6 (six) hours as needed.   Yes Historical Provider, MD  levonorgestrel (MIRENA) 20 MCG/24HR IUD 1 each by Intrauterine route continuous.    Yes Historical Provider, MD  SAPHRIS 10 MG SUBL Take 1 tablet by mouth 2 (two) times daily.  06/05/15  Yes Historical Provider, MD  CLOBETASOL PROPIONATE E 0.05 % emollient cream APPLY TO AFFECTED AREA ON HANDS TWICE DAILY AS NEEDED Patient not taking: Reported on 12/04/2014 06/20/14   Salley Scarlet, MD  dicyclomine (BENTYL) 20 MG tablet Take 1 tablet (20 mg total) by mouth every 6 (six) hours. Patient not taking: Reported on 12/04/2014 05/25/14   Salley Scarlet, MD  fluticasone [facility]) 50 MCG/ACT nasal spray Place 2 sprays into both nostrils daily. Patient not taking: Reported on 06/19/2015 09/27/14   Salley Scarlet, MD  gabapentin (NEURONTIN) 100 MG capsule Take 1-2 tablets at bedtime Patient not taking: Reported on 06/19/2015 04/19/14   Salley Scarlet, MD  LORazepam [facility])  1 MG tablet Take 1 tablet (1 mg total) by mouth 2 (two) times daily as needed for anxiety. 08/02/14   Salley Scarlet, MD  naproxen (NAPROSYN) 500 MG tablet Take 1 tablet (500 mg total) by mouth 2 (two) times daily with a meal. Patient not taking: Reported on 06/19/2015 12/04/14   Everlene Farrier, PA-C  Oxcarbazepine (TRILEPTAL) 300 MG tablet Take 1 tablet (300 mg total) by mouth 2 (two) times daily. Patient not taking: Reported on 06/19/2015 10/02/14   Azalia Bilis, MD  venlafaxine XR (EFFEXOR-XR) 150 MG 24 hr capsule Take 1 capsule (150 mg total) by  mouth daily with breakfast. Patient not taking: Reported on 06/19/2015 10/02/14   Azalia Bilis, MD   BP 123/75 mmHg  Pulse 62  Temp(Src) 98.9 F (37.2 C) (Oral)  Resp 18  Wt 193 lb (87.544 kg)  SpO2 100% Physical Exam  Physical Exam  Nursing note and vitals reviewed. Constitutional: Well developed, well nourished, non-toxic, and in no acute distress Head: Normocephalic and atraumatic.  Mouth/Throat: Oropharynx is clear and moist.  Neck: Normal range of motion. Neck supple. No meningismus. Cardiovascular: Normal rate and regular rhythm.  No edema. Pulmonary/Chest: Effort normal and breath sounds normal.  Abdominal: Soft. There is no tenderness. There is no rebound and no guarding.  Musculoskeletal: Normal range of motion.  Skin: Skin is warm and dry.  Psychiatric: Cooperative  Neurological:  Alert, oriented to person, place, time, and situation. Memory grossly in tact. Fluent speech. No dysarthria or aphasia.  Cranial nerves: VF are full. Pupils are symmetric, and reactive to light. EOMI without nystagmus. No gaze deviation. Facial muscles symmetric with activation. Sensation to light touch over face in tact bilaterally. Hearing grossly in tact. Palate elevates symmetrically. Head turn and shoulder shrug are intact. Tongue midline.  Muscle bulk and tone normal. No pronator drift. Moves all extremities symmetrically. Sensation to light touch is in tact throughout in bilateral upper and lower extremities. Coordination reveals no dysmetria with finger to nose. Gait is narrow-based and steady. Non-ataxic.  ED Course  Procedures (including critical care time) Labs Review Labs Reviewed  I-STAT BETA HCG BLOOD, ED ([facility], [facility], [facility] ONLY)    Imaging Review No results found. I have personally reviewed and evaluated these images and lab results as part of my medical decision-making.  MDM   Final diagnoses:  Tension-type headache, not intractable, unspecified chronicity pattern     34 year old female with history of depression and bipolar disorder who presents with headache in the setting of reported high blood pressures. She is well-appearing, nontoxic, in no acute distress. She is minimally hypertensive with blood pressure of 148/90 or, and I do not suspect that this is the etiology of her headache. She is neurologically intact, and there is no concerning features of her headache that would be concerning for serious secondary etiologies such as SAH, meningitis, or mass. Pain not of sudden onset maximal intensity. No meningismus. No neuro complaints.  Patient is given reassurance, and will follow-up with her primary care doctor regarding blood pressure management as an outpatient. At this time she does not require acute medications for this. She is given IV fluids and migraine cocktail for her headache. Reports improved headache after medications, and felt comfortable managing symptoms from home. Strict return and follow-up instructions were reviewed. She expressed understanding of all discharge instructions for comfortable to plan of care.    Lavera Guise, MD 06/19/15 1153

## 2015-06-26 ENCOUNTER — Other Ambulatory Visit: Payer: Self-pay | Admitting: Nurse Practitioner

## 2015-06-26 DIAGNOSIS — N6310 Unspecified lump in the right breast, unspecified quadrant: Secondary | ICD-10-CM

## 2015-07-10 IMAGING — US US TRANSVAGINAL NON-OB
1 series · 13 of 25 positions shown · non-contrast
Comparison: None

CLINICAL DATA: Three months of right lower quadrant pain; the
patient has long history of IUD use ; last normal period was 8 years
ago



[Series 1: us transvaginal non-ob · 0.28mm/px · 13 of 58 slices shown]
[im 1/58]
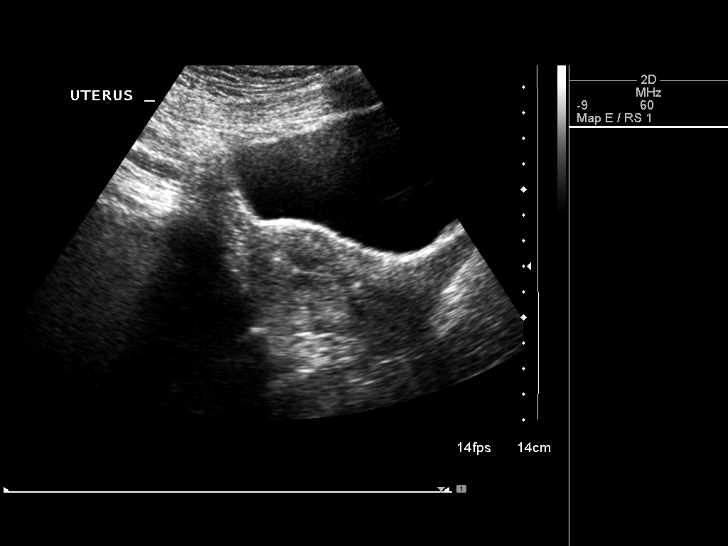
[im 5/58]
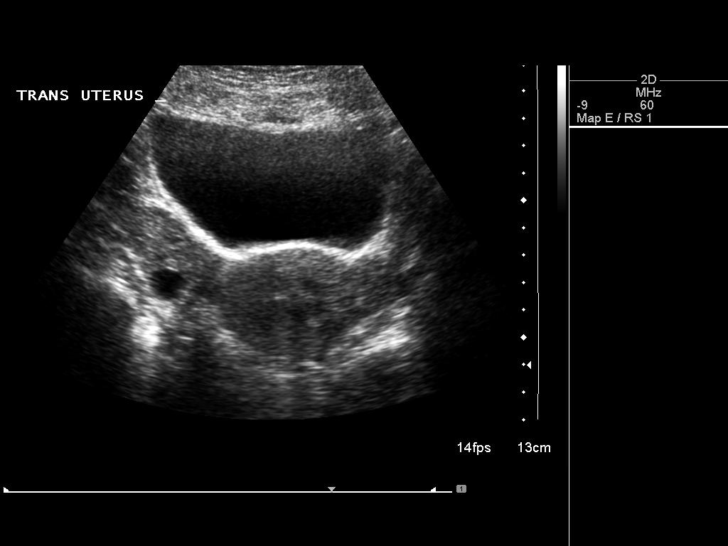
[im 10/58]
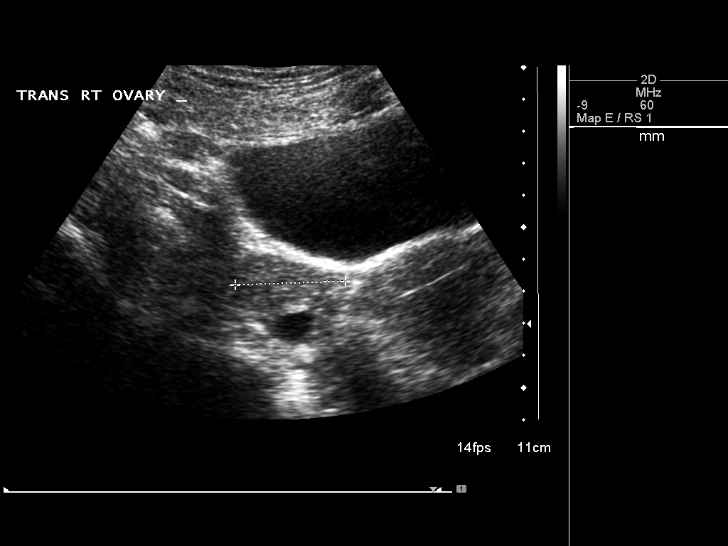
[im 15/58]
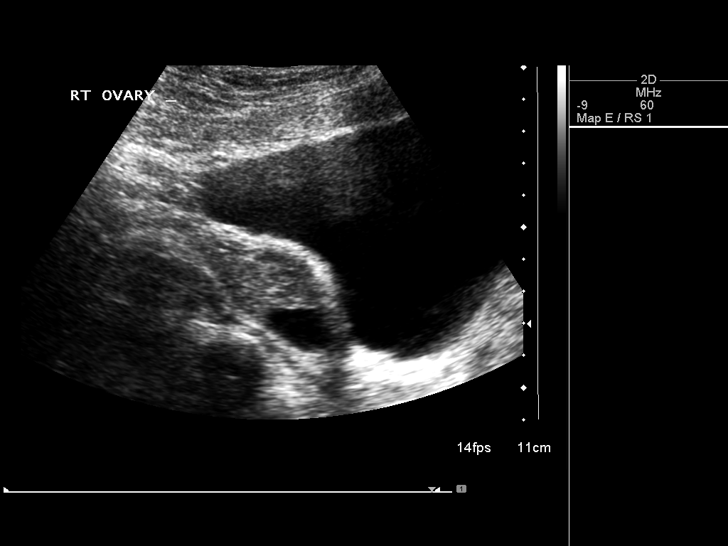
[im 20/58]
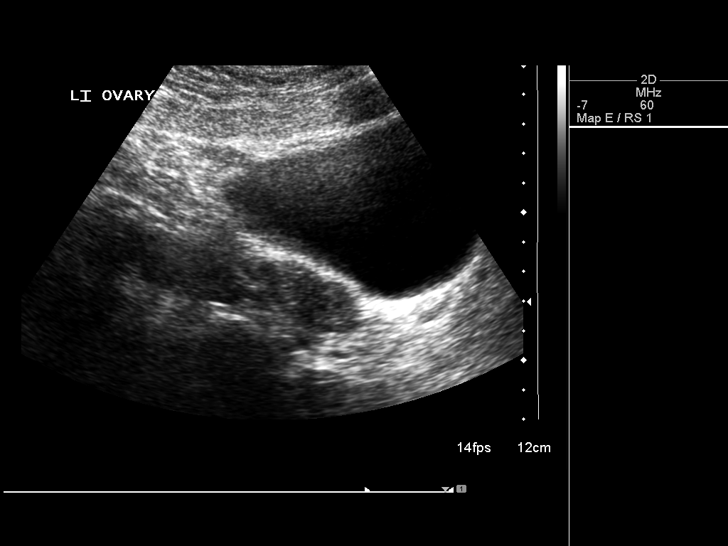
[im 24/58]
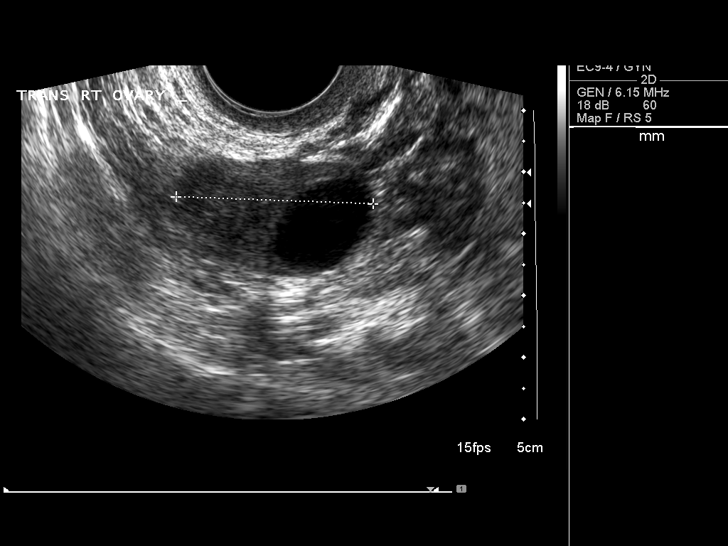
[im 29/58]
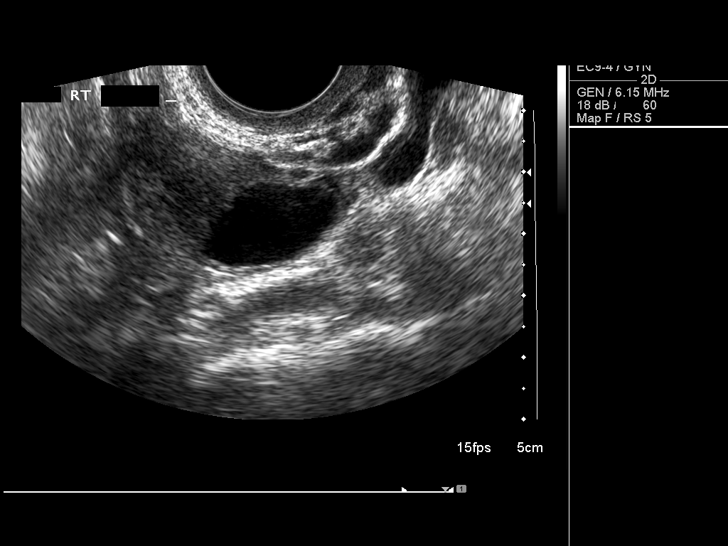
[im 34/58]
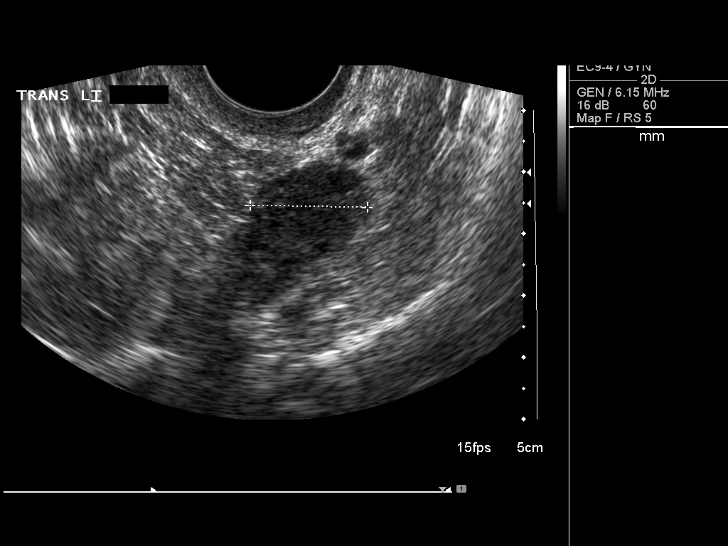
[im 39/58]
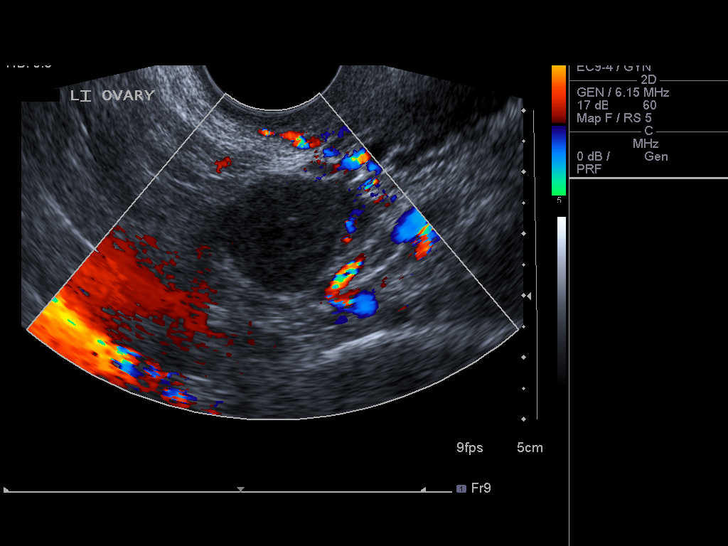
[im 43/58]
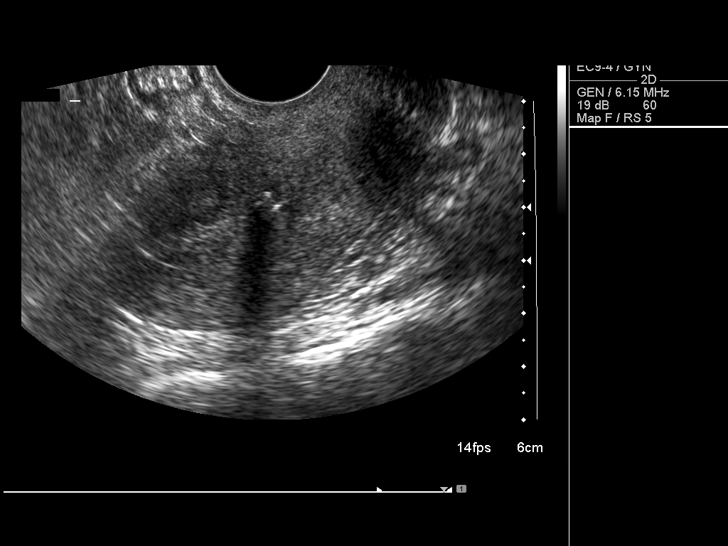
[im 48/58]
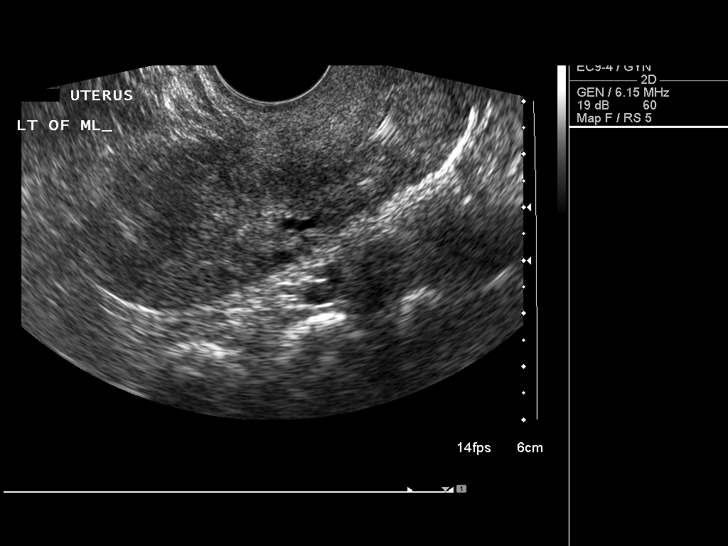
[im 53/58]
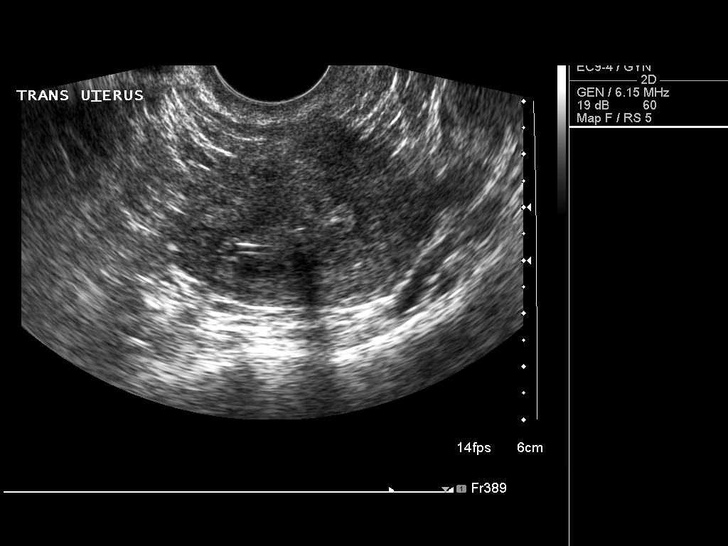
[im 58/58]
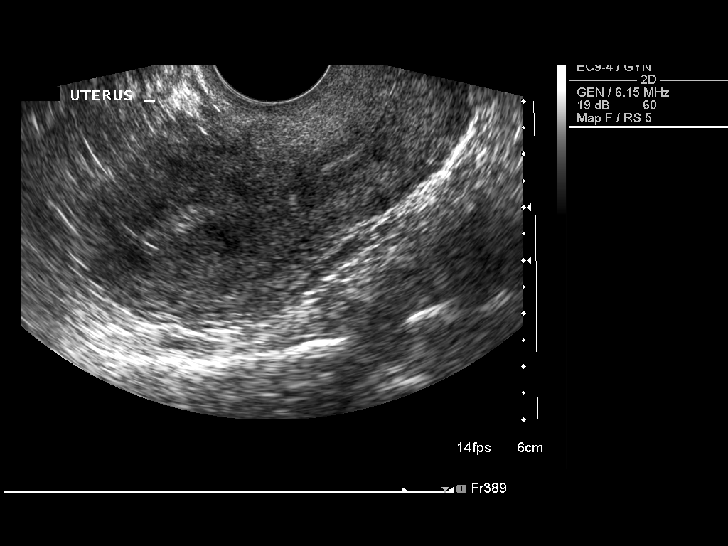

[13 of 25 positions shown; findings below may reference images not displayed]

FINDINGS: Uterus

Measurements: 7.7 x 3.7 x 5.0 cm. No fibroids or other mass
visualized.

Endometrium

Thickness: 5.7 mm..  An IUD is present.

Right ovary

Measurements: 3.8 x 2.1 x 3.2 cm. There is a simple appearing right
ovarian cyst measuring 2.4 x 1.3 x 1.4 cm

Left ovary

Measurements: 2.2 x 2.1 x 1.9 cm. Normal appearance/no adnexal mass.

Other findings

There is a trace of free pelvic fluid.
IMPRESSION: 1. The uterus is normal in echotexture and contour. The endometrial
stripe is normal. The IUD appears to be appropriately positioned.
2. There is a simple appearing cysts in the right ovary measuring up
to 2.4 cm. The left ovary is unremarkable.
3. There is a trace of free pelvic fluid.

## 2015-07-16 ENCOUNTER — Other Ambulatory Visit: Payer: Medicaid Other

## 2015-07-19 ENCOUNTER — Other Ambulatory Visit: Payer: Medicaid Other

## 2015-08-14 ENCOUNTER — Ambulatory Visit
Admission: RE | Admit: 2015-08-14 | Discharge: 2015-08-14 | Disposition: A | Discharge status: Home / Self Care | Payer: Medicaid Other | Source: Ambulatory Visit | Attending: Nurse Practitioner | Admitting: Nurse Practitioner

## 2015-08-14 DIAGNOSIS — N6310 Unspecified lump in the right breast, unspecified quadrant: Secondary | ICD-10-CM

## 2016-02-05 ENCOUNTER — Emergency Department (HOSPITAL_COMMUNITY)
Admission: EM | Admit: 2016-02-05 | Discharge: 2016-02-05 | Disposition: A | Discharge status: Home / Self Care | Payer: Medicaid Other | Attending: Emergency Medicine | Admitting: Emergency Medicine

## 2016-02-05 ENCOUNTER — Encounter (HOSPITAL_COMMUNITY): Payer: Self-pay | Admitting: Emergency Medicine

## 2016-02-05 DIAGNOSIS — Z9049 Acquired absence of other specified parts of digestive tract: Secondary | ICD-10-CM | POA: Insufficient documentation

## 2016-02-05 DIAGNOSIS — F3181 Bipolar II disorder: Secondary | ICD-10-CM | POA: Diagnosis not present

## 2016-02-05 DIAGNOSIS — Z8719 Personal history of other diseases of the digestive system: Secondary | ICD-10-CM | POA: Diagnosis not present

## 2016-02-05 DIAGNOSIS — Z87891 Personal history of nicotine dependence: Secondary | ICD-10-CM | POA: Diagnosis not present

## 2016-02-05 DIAGNOSIS — Z79899 Other long term (current) drug therapy: Secondary | ICD-10-CM | POA: Diagnosis not present

## 2016-02-05 DIAGNOSIS — R1013 Epigastric pain: Secondary | ICD-10-CM | POA: Diagnosis present

## 2016-02-05 LAB — COMPREHENSIVE METABOLIC PANEL
ALBUMIN: 3.8 g/dL (ref 3.5–5.0)
ALK PHOS: 48 U/L (ref 38–126)
ALT: 18 U/L (ref 14–54)
ANION GAP: 7 (ref 5–15)
AST: 20 U/L (ref 15–41)
BILIRUBIN TOTAL: 0.7 mg/dL (ref 0.3–1.2)
BUN: 9 mg/dL (ref 6–20)
CALCIUM: 8.8 mg/dL — AB (ref 8.9–10.3)
CO2: 27 mmol/L (ref 22–32)
CREATININE: 0.67 mg/dL (ref 0.44–1.00)
Chloride: 106 mmol/L (ref 101–111)
GFR calc Af Amer: >60 mL/min (ref >60)
GFR calc non Af Amer: >60 mL/min (ref >60)
GLUCOSE: 79 mg/dL (ref 65–99)
Potassium: 3.7 mmol/L (ref 3.5–5.1)
Sodium: 140 mmol/L (ref 135–145)
TOTAL PROTEIN: 7 g/dL (ref 6.5–8.1)

## 2016-02-05 LAB — CBC WITH DIFFERENTIAL/PLATELET
BASOS PCT: 0 %
Basophils Absolute: 0 10*3/uL (ref 0.0–0.1)
Eosinophils Absolute: 0.3 10*3/uL (ref 0.0–0.7)
Eosinophils Relative: 4 %
HEMATOCRIT: 40.5 % (ref 36.0–46.0)
HEMOGLOBIN: 13.8 g/dL (ref 12.0–15.0)
LYMPHS ABS: 3.3 10*3/uL (ref 0.7–4.0)
Lymphocytes Relative: 46 %
MCH: 25.7 pg — ABNORMAL LOW (ref 26.0–34.0)
MCHC: 34.1 g/dL (ref 30.0–36.0)
MCV: 75.4 fL — ABNORMAL LOW (ref 78.0–100.0)
MONOS PCT: 8 %
Monocytes Absolute: 0.6 10*3/uL (ref 0.1–1.0)
NEUTROS ABS: 3 10*3/uL (ref 1.7–7.7)
NEUTROS PCT: 42 %
Platelets: 214 10*3/uL (ref 150–400)
RBC: 5.37 MIL/uL — AB (ref 3.87–5.11)
RDW: 14.2 % (ref 11.5–15.5)
WBC: 7.2 10*3/uL (ref 4.0–10.5)

## 2016-02-05 LAB — LIPASE, BLOOD: Lipase: 21 U/L (ref 11–51)

## 2016-02-05 MED ORDER — PANTOPRAZOLE SODIUM 20 MG PO TBEC: 20.0000 mg | DELAYED_RELEASE_TABLET | Freq: Every day | ORAL | Status: DC

## 2016-02-05 MED ORDER — ONDANSETRON HCL 4 MG/2ML IJ SOLN
4.0000 mg | Freq: Once | INTRAMUSCULAR | Status: AC
  Administered 2016-02-05: 4 mg via INTRAVENOUS
  Filled 2016-02-05: qty 2

## 2016-02-05 MED ORDER — ONDANSETRON HCL 4 MG PO TABS: 4.0000 mg | ORAL_TABLET | Freq: Four times a day (QID) | ORAL | Status: DC

## 2016-02-05 MED ORDER — SODIUM CHLORIDE 0.9 % IV BOLUS (SEPSIS)
1000.0000 mL | Freq: Once | INTRAVENOUS | Status: AC
  Administered 2016-02-05: 1000 mL via INTRAVENOUS

## 2016-02-05 MED ORDER — PANTOPRAZOLE SODIUM 40 MG IV SOLR
40.0000 mg | Freq: Once | INTRAVENOUS | Status: AC
  Administered 2016-02-05: 40 mg via INTRAVENOUS
  Filled 2016-02-05: qty 40

## 2016-02-05 NOTE — Discharge Instructions (Signed)
Tests showed no life-threatening condition. Medication for ulcers and nausea. Follow-up your primary care doctor.

## 2016-02-05 NOTE — ED Notes (Signed)
Per pt, states history of GERD-states symptoms increased with PO intake-also having anxiety

## 2016-02-05 NOTE — ED Notes (Signed)
Pt ambulatory and independent at discharge.  

## 2016-02-05 NOTE — ED Provider Notes (Signed)
CSN: [id number]     Arrival date & time 02/05/16  1420 History   First MD Initiated Contact with Patient 02/05/16 1547     Chief Complaint  Patient presents with  . Gastroesophageal Reflux     (Consider location/radiation/quality/duration/timing/severity/associated sxs/prior Treatment) HPI.Marland KitchenMarland KitchenMarland KitchenEpigastric pain described as burning for a long period of time. She has been taking Zantac over-the-counter with minimal relief. No nausea, vomiting, diarrhea, black stool, chest pain, dyspnea. Status post cholecystectomy. She also has mental health problems. Severity of pain is minimal. Nothing makes symptoms better or worse.  Past Medical History  Diagnosis Date  . Depression   . Irritable bowel syndrome (IBS)   . Bipolar II disorder [facility])    Past Surgical History  Procedure Laterality Date  . Cholecystectomy    . Mouth surgery     Family History  Problem Relation Age of Onset  . Mental illness Mother   . Hyperlipidemia Father   . Cancer Paternal Grandmother     Breast CA   Social History  Substance Use Topics  . Smoking status: Former Games developer  . Smokeless tobacco: Never Used  . Alcohol Use: 1.5 oz/week    3 Standard drinks or equivalent per week     Comment: social   OB History    Gravida Para Term Preterm AB TAB SAB Ectopic Multiple Living   2 2             Review of Systems  All other systems reviewed and are negative.     Allergies  Review of patient's allergies indicates no known allergies.  Home Medications   Prior to Admission medications   Medication Sig Start Date End Date Taking? Authorizing Provider  levonorgestrel (MIRENA) 20 MCG/24HR IUD 1 each by Intrauterine route continuous.    Yes Historical Provider, MD  metoprolol (LOPRESSOR) 50 MG tablet Take 50 mg by mouth 2 (two) times daily.   Yes Historical Provider, MD  polyethylene glycol (MIRALAX / GLYCOLAX) packet Take 17 g by mouth every other day.   Yes Historical Provider, MD  QUEtiapine (SEROQUEL XR) 200  MG 24 hr tablet Take 200 mg by mouth at bedtime.   Yes Historical Provider, MD  ranitidine (ZANTAC) 150 MG tablet Take 150 mg by mouth 2 (two) times daily.   Yes Historical Provider, MD  venlafaxine XR (EFFEXOR-XR) 150 MG 24 hr capsule Take 1 capsule (150 mg total) by mouth daily with breakfast. 10/02/14  Yes Azalia Bilis, MD  ondansetron (ZOFRAN) 4 MG tablet Take 1 tablet (4 mg total) by mouth every 6 (six) hours. 02/05/16   Donnetta Hutching, MD  pantoprazole (PROTONIX) 20 MG tablet Take 1 tablet (20 mg total) by mouth daily. 02/05/16   Donnetta Hutching, MD   BP 132/79 mmHg  Pulse 73  Temp(Src) 97.9 F (36.6 C) (Oral)  Resp 15  SpO2 100% Physical Exam  Constitutional: She is oriented to person, place, and time. She appears well-developed and well-nourished.  HENT:  Head: Normocephalic and atraumatic.  Eyes: Conjunctivae and EOM are normal. Pupils are equal, round, and reactive to light.  Neck: Normal range of motion. Neck supple.  Cardiovascular: Normal rate and regular rhythm.   Pulmonary/Chest: Effort normal and breath sounds normal.  Abdominal: Soft. Bowel sounds are normal.  Minimal tenderness in the epigastrium  Musculoskeletal: Normal range of motion.  Neurological: She is alert and oriented to person, place, and time.  Skin: Skin is warm and dry.  Psychiatric: She has a normal mood and affect. Her  behavior is normal.  Nursing note and vitals reviewed.   ED Course  Procedures (including critical care time) Labs Review Labs Reviewed  CBC WITH DIFFERENTIAL/PLATELET - Abnormal; Notable for the following:    RBC 5.37 (*)    MCV 75.4 (*)    MCH 25.7 (*)    All other components within normal limits  COMPREHENSIVE METABOLIC PANEL - Abnormal; Notable for the following:    Calcium 8.8 (*)    All other components within normal limits  LIPASE, BLOOD    Imaging Review No results found. I have personally reviewed and evaluated these images and lab results as part of my medical  decision-making.   EKG Interpretation None      MDM   Final diagnoses:  Epigastric abdominal pain    No acute abdomen. Screening labs within normal limits. IV Protonix helped a small amount. Discharge medication Protonix 20 mg and Zofran 8 mg    Donnetta HutchingBrian Carole Doner, MD 02/05/16 2040

## 2016-04-16 ENCOUNTER — Other Ambulatory Visit: Payer: Self-pay | Admitting: Family Medicine

## 2016-04-16 DIAGNOSIS — N6310 Unspecified lump in the right breast, unspecified quadrant: Secondary | ICD-10-CM

## 2016-04-23 ENCOUNTER — Other Ambulatory Visit: Payer: Medicaid Other

## 2016-04-24 ENCOUNTER — Other Ambulatory Visit: Payer: Medicaid Other

## 2016-06-23 ENCOUNTER — Other Ambulatory Visit: Payer: Medicaid Other

## 2016-07-03 ENCOUNTER — Emergency Department (HOSPITAL_COMMUNITY)
Admission: EM | Admit: 2016-07-03 | Discharge: 2016-07-03 | Disposition: A | Discharge status: Home / Self Care | Payer: Medicaid Other | Attending: Emergency Medicine | Admitting: Emergency Medicine

## 2016-07-03 ENCOUNTER — Encounter (HOSPITAL_COMMUNITY): Payer: Self-pay | Admitting: *Deleted

## 2016-07-03 DIAGNOSIS — R112 Nausea with vomiting, unspecified: Secondary | ICD-10-CM | POA: Insufficient documentation

## 2016-07-03 DIAGNOSIS — Z87891 Personal history of nicotine dependence: Secondary | ICD-10-CM | POA: Insufficient documentation

## 2016-07-03 DIAGNOSIS — Z79899 Other long term (current) drug therapy: Secondary | ICD-10-CM | POA: Insufficient documentation

## 2016-07-03 DIAGNOSIS — R110 Nausea: Secondary | ICD-10-CM

## 2016-07-03 LAB — URINALYSIS, ROUTINE W REFLEX MICROSCOPIC
Bilirubin Urine: NEGATIVE
GLUCOSE, UA: NEGATIVE mg/dL
HGB URINE DIPSTICK: NEGATIVE
KETONES UR: NEGATIVE mg/dL
Nitrite: NEGATIVE
PROTEIN: NEGATIVE mg/dL
Specific Gravity, Urine: 1.012 (ref 1.005–1.030)
pH: 7.5 (ref 5.0–8.0)

## 2016-07-03 LAB — COMPREHENSIVE METABOLIC PANEL
ALK PHOS: 61 U/L (ref 38–126)
ALT: 37 U/L (ref 14–54)
AST: 26 U/L (ref 15–41)
Albumin: 4.2 g/dL (ref 3.5–5.0)
Anion gap: 6 (ref 5–15)
BUN: 8 mg/dL (ref 6–20)
CALCIUM: 9.4 mg/dL (ref 8.9–10.3)
CHLORIDE: 103 mmol/L (ref 101–111)
CO2: 27 mmol/L (ref 22–32)
CREATININE: 0.61 mg/dL (ref 0.44–1.00)
GFR calc Af Amer: >60 mL/min (ref >60)
Glucose, Bld: 75 mg/dL (ref 65–99)
Potassium: 3.9 mmol/L (ref 3.5–5.1)
Sodium: 136 mmol/L (ref 135–145)
Total Bilirubin: 0.5 mg/dL (ref 0.3–1.2)
Total Protein: 8.2 g/dL — ABNORMAL HIGH (ref 6.5–8.1)

## 2016-07-03 LAB — CBC
HCT: 40.1 % (ref 36.0–46.0)
Hemoglobin: 13.9 g/dL (ref 12.0–15.0)
MCH: 25.3 pg — AB (ref 26.0–34.0)
MCHC: 34.7 g/dL (ref 30.0–36.0)
MCV: 72.9 fL — AB (ref 78.0–100.0)
PLATELETS: 262 10*3/uL (ref 150–400)
RBC: 5.5 MIL/uL — ABNORMAL HIGH (ref 3.87–5.11)
RDW: 13 % (ref 11.5–15.5)
WBC: 7.8 10*3/uL (ref 4.0–10.5)

## 2016-07-03 LAB — LIPASE, BLOOD: LIPASE: 30 U/L (ref 11–51)

## 2016-07-03 LAB — URINE MICROSCOPIC-ADD ON: RBC / HPF: NONE SEEN RBC/hpf (ref 0–5)

## 2016-07-03 MED ORDER — PROMETHAZINE HCL 25 MG/ML IJ SOLN
25.0000 mg | Freq: Once | INTRAMUSCULAR | Status: AC
  Administered 2016-07-03: 25 mg via INTRAVENOUS
  Filled 2016-07-03: qty 1

## 2016-07-03 MED ORDER — SODIUM CHLORIDE 0.9 % IV BOLUS (SEPSIS)
1000.0000 mL | Freq: Once | INTRAVENOUS | Status: AC
  Administered 2016-07-03: 1000 mL via INTRAVENOUS

## 2016-07-03 MED ORDER — PROMETHAZINE HCL 25 MG PO TABS: 25.0000 mg | ORAL_TABLET | Freq: Four times a day (QID) | ORAL | 0 refills | Status: DC | PRN

## 2016-07-03 MED ORDER — ONDANSETRON HCL 4 MG/2ML IJ SOLN
4.0000 mg | Freq: Once | INTRAMUSCULAR | Status: AC
  Administered 2016-07-03: 4 mg via INTRAVENOUS
  Filled 2016-07-03: qty 2

## 2016-07-03 NOTE — Discharge Instructions (Signed)
As discussed, your evaluation today has been largely reassuring.  But, it is important that you monitor your condition carefully, and do not hesitate to return to the ED if you develop new, or concerning changes in your condition. ? ?Otherwise, please follow-up with your physician for appropriate ongoing care. ? ?

## 2016-07-03 NOTE — ED Provider Notes (Signed)
[facility]-EMERGENCY DEPT Provider Note   CSN: [id number] Arrival date & time: 07/03/16  0906     History   Chief Complaint Chief Complaint  Patient presents with  . Emesis    HPI Alicia Rosario is a 35 y.o. female.  HPI Patient presents with concern of ongoing nausea, gagging. Symptoms of been present for about 2 weeks after recent change in antipsychotic medication. During this illness the patient has attempted to follow-up with primary care, but has been unable to do so. There is no associated abdominal pain, no chest pain, no fever, no chills. No relief with OTC medication, multiple attempts. Beyond psychiatric disease, the patient has history of hypertension, IBS. However, the patient denies any similarity of symptoms to IBS flares.    Past Medical History:  Diagnosis Date  . Bipolar II disorder (HCC)   . Depression   . Irritable bowel syndrome (IBS)     Patient Active Problem List   Diagnosis Date Noted  . 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 Suicidal ideation 10/02/2014  . Bipolar II disorder (HCC) 08/02/2014  . GAD (generalized anxiety disorder) 08/02/2014  . Mood disorder (HCC) 06/26/2014  . Insomnia 06/26/2014  . IBS (irritable bowel syndrome) 04/19/2014  . 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 MDD (major depressive disorder) (HCC) 04/19/2014  . Abdominal pain, unspecified site 02/22/2014  . Carpal tunnel syndrome 02/22/2014  . Diarrhea 02/22/2014  . Dyshidrotic eczema 02/22/2014    Past Surgical History:  Procedure Laterality Date  . CHOLECYSTECTOMY    . MOUTH SURGERY      OB History    Gravida Para Term Preterm AB Living   2 2           SAB TAB Ectopic Multiple Live Births                   Home Medications    Prior to Admission medications   Medication Sig Start Date End Date Taking? Authorizing Provider  benztropine (COGENTIN) 0.5 MG tablet Take 0.5 mg by mouth at bedtime.   Yes Historical Provider, MD  Carbamazepine (EQUETRO) 100 MG CP12 12  hr capsule Take 200 mg by mouth at bedtime.   Yes Historical Provider, MD  fexofenadine (ALLEGRA) 180 MG tablet Take 180 mg by mouth daily.   Yes Historical Provider, MD  metoCLOPramide (REGLAN) 10 MG tablet Take 10 mg by mouth 4 (four) times daily -  before meals and at bedtime.   Yes Historical Provider, MD  metoprolol (LOPRESSOR) 50 MG tablet Take 50 mg by mouth 2 (two) times daily.   Yes Historical Provider, MD  omeprazole (PRILOSEC) 40 MG capsule Take 40 mg by mouth daily.   Yes Historical Provider, MD  venlafaxine XR (EFFEXOR-XR) 150 MG 24 hr capsule Take 1 capsule (150 mg total) by mouth daily with breakfast. 10/02/14  Yes Azalia BilisKevin Campos, MD  ziprasidone (GEODON) 60 MG capsule Take 60 mg by mouth at bedtime.   Yes Historical Provider, MD  ondansetron (ZOFRAN) 4 MG tablet Take 1 tablet (4 mg total) by mouth every 6 (six) hours. Patient not taking: Reported on 07/03/2016 02/05/16   Donnetta HutchingBrian Cook, MD  pantoprazole (PROTONIX) 20 MG tablet Take 1 tablet (20 mg total) by mouth daily. Patient not taking: Reported on 07/03/2016 02/05/16   Donnetta HutchingBrian Cook, MD    Family History Family History  Problem Relation Age of Onset  . Mental illness Mother   . Hyperlipidemia Father   . Cancer Paternal Grandmother  Breast CA    Social History Social History  Substance Use Topics  . Smoking status: Former Games developer  . Smokeless tobacco: Never Used  . Alcohol use 1.5 oz/week    3 Standard drinks or equivalent per week     Comment: social     Allergies   Review of patient's allergies indicates no known allergies.   Review of Systems Review of Systems  Constitutional:       Per HPI, otherwise negative  HENT:       Per HPI, otherwise negative  Respiratory:       Per HPI, otherwise negative  Cardiovascular:       Per HPI, otherwise negative  Gastrointestinal: Positive for nausea. Negative for abdominal pain and vomiting.  Endocrine:       Negative aside from HPI  Genitourinary:       Neg aside from  HPI   Musculoskeletal:       Per HPI, otherwise negative  Skin: Negative.   Neurological: Negative for syncope.  Psychiatric/Behavioral: Positive for dysphoric mood.     Physical Exam Updated Vital Signs BP 130/90 (BP Location: Right Arm)   Pulse 75   Temp 98 F (36.7 C)   Resp 17   SpO2 100%   Physical Exam  Constitutional: She is oriented to person, place, and time. She appears well-developed and well-nourished. No distress.  HENT:  Head: Normocephalic and atraumatic.  Eyes: Conjunctivae and EOM are normal.  Cardiovascular: Normal rate and regular rhythm.   Pulmonary/Chest: Effort normal and breath sounds normal. No stridor. No respiratory distress.  Abdominal: She exhibits no distension and no mass. There is no tenderness. There is no guarding.  Musculoskeletal: She exhibits no edema.  Neurological: She is alert and oriented to person, place, and time. No cranial nerve deficit.  Skin: Skin is warm and dry.  Psychiatric: She has a normal mood and affect.  Nursing note and vitals reviewed.    ED Treatments / Results  Labs (all labs ordered are listed, but only abnormal results are displayed) Labs Reviewed  COMPREHENSIVE METABOLIC PANEL - Abnormal; Notable for the following:       Result Value   Total Protein 8.2 (*)    All other components within normal limits  CBC - Abnormal; Notable for the following:    RBC 5.50 (*)    MCV 72.9 (*)    MCH 25.3 (*)    All other components within normal limits  URINALYSIS, ROUTINE W REFLEX MICROSCOPIC (NOT AT [facility]) - Abnormal; Notable for the following:    Leukocytes, UA MODERATE (*)    All other components within normal limits  URINE MICROSCOPIC-ADD ON - Abnormal; Notable for the following:    Squamous Epithelial / LPF 0-5 (*)    Bacteria, UA FEW (*)    All other components within normal limits  LIPASE, BLOOD     Radiology No results found.  Procedures Procedures (including critical care time)  Medications Ordered in  ED Medications  sodium chloride 0.9 % bolus 1,000 mL (1,000 mLs Intravenous New Bag/Given 07/03/16 1057)  ondansetron (ZOFRAN) injection 4 mg (4 mg Intravenous Given 07/03/16 1057)     Initial Impression / Assessment and Plan / ED Course  I have reviewed the triage vital signs and the nursing notes.  Pertinent labs & imaging results that were available during my care of the patient were reviewed by me and considered in my medical decision making (see chart for details). On repeat exam the  patient is calm, no active vomiting. We discussed all findings. With improvement in symptoms, patient will be started on a course of antibiotics, follow up with primary care.  She presents with 2 weeks of ongoing nausea, no abdominal pain. Patient does have history of IBS, but denies any pain, and today's evaluation does not suggest acute abdominal processes. Symptoms maybe secondary to new medication regimen, versus gastric irritation, though absent pain, this is less likely. Patient started on a course of medication for relief, will follow-up with primary care.    Gerhard Munch, MD 07/03/16 1123

## 2016-07-03 NOTE — ED Triage Notes (Signed)
Pt reports n/v x 2 weeks.  She reports she had her meds changed recently.  States she thinks she was having withdrawals from her effexor, but she has been on it for 2-3 weeks now and is still having n/v.  Pt reports seeing her PCP more than 2 weeks ago and had a pregnancy test which was negative.  She states she cannot keep anything down.

## 2016-08-20 ENCOUNTER — Inpatient Hospital Stay (HOSPITAL_COMMUNITY)
Admission: AD | Admit: 2016-08-20 | Discharge: 2016-08-20 | Discharge status: Against Medical Advice | Payer: Medicaid Other | Source: Ambulatory Visit | Attending: Family Medicine | Admitting: Family Medicine

## 2016-08-20 ENCOUNTER — Encounter (HOSPITAL_COMMUNITY): Payer: Self-pay

## 2016-08-20 DIAGNOSIS — Z9049 Acquired absence of other specified parts of digestive tract: Secondary | ICD-10-CM | POA: Insufficient documentation

## 2016-08-20 DIAGNOSIS — R109 Unspecified abdominal pain: Secondary | ICD-10-CM | POA: Diagnosis not present

## 2016-08-20 DIAGNOSIS — Z9889 Other specified postprocedural states: Secondary | ICD-10-CM | POA: Diagnosis not present

## 2016-08-20 DIAGNOSIS — O26891 Other specified pregnancy related conditions, first trimester: Secondary | ICD-10-CM | POA: Insufficient documentation

## 2016-08-20 DIAGNOSIS — Z3A13 13 weeks gestation of pregnancy: Secondary | ICD-10-CM | POA: Insufficient documentation

## 2016-08-20 DIAGNOSIS — Z87891 Personal history of nicotine dependence: Secondary | ICD-10-CM | POA: Insufficient documentation

## 2016-08-20 LAB — URINALYSIS, ROUTINE W REFLEX MICROSCOPIC
BILIRUBIN URINE: NEGATIVE
GLUCOSE, UA: NEGATIVE mg/dL
HGB URINE DIPSTICK: NEGATIVE
KETONES UR: NEGATIVE mg/dL
Leukocytes, UA: NEGATIVE
Nitrite: NEGATIVE
PH: 7 (ref 5.0–8.0)
PROTEIN: NEGATIVE mg/dL
Specific Gravity, Urine: 1.02 (ref 1.005–1.030)

## 2016-08-20 LAB — POCT PREGNANCY, URINE: Preg Test, Ur: POSITIVE — AB

## 2016-08-20 NOTE — MAU Note (Signed)
Pt states [facility] about a week ago. Pt states she is having pain in her left lower abdomen for the last two days. Pt states the pain has been increasing. Pt denies vaginal bleeding.

## 2016-08-22 ENCOUNTER — Inpatient Hospital Stay (HOSPITAL_COMMUNITY)
Admission: AD | Admit: 2016-08-22 | Discharge: 2016-08-22 | Disposition: A | Discharge status: Home / Self Care | Payer: Medicaid Other | Source: Ambulatory Visit | Attending: Obstetrics and Gynecology | Admitting: Obstetrics and Gynecology

## 2016-08-22 ENCOUNTER — Encounter (HOSPITAL_COMMUNITY): Payer: Self-pay

## 2016-08-22 DIAGNOSIS — Z79899 Other long term (current) drug therapy: Secondary | ICD-10-CM | POA: Insufficient documentation

## 2016-08-22 DIAGNOSIS — R109 Unspecified abdominal pain: Secondary | ICD-10-CM | POA: Insufficient documentation

## 2016-08-22 DIAGNOSIS — R102 Pelvic and perineal pain: Secondary | ICD-10-CM | POA: Diagnosis present

## 2016-08-22 DIAGNOSIS — Z87891 Personal history of nicotine dependence: Secondary | ICD-10-CM | POA: Diagnosis not present

## 2016-08-22 DIAGNOSIS — Z3A13 13 weeks gestation of pregnancy: Secondary | ICD-10-CM | POA: Insufficient documentation

## 2016-08-22 DIAGNOSIS — O26891 Other specified pregnancy related conditions, first trimester: Secondary | ICD-10-CM | POA: Diagnosis present

## 2016-08-22 DIAGNOSIS — O26899 Other specified pregnancy related conditions, unspecified trimester: Secondary | ICD-10-CM

## 2016-08-22 HISTORY — DX: Essential (primary) hypertension: I10

## 2016-08-22 LAB — CBC
HEMATOCRIT: 35.5 % — AB (ref 36.0–46.0)
HEMOGLOBIN: 12.6 g/dL (ref 12.0–15.0)
MCH: 26 pg (ref 26.0–34.0)
MCHC: 35.5 g/dL (ref 30.0–36.0)
MCV: 73.2 fL — AB (ref 78.0–100.0)
Platelets: 211 10*3/uL (ref 150–400)
RBC: 4.85 MIL/uL (ref 3.87–5.11)
RDW: 13.3 % (ref 11.5–15.5)
WBC: 9.1 10*3/uL (ref 4.0–10.5)

## 2016-08-22 LAB — ABO/RH: ABO/RH(D): O POS

## 2016-08-22 LAB — URINALYSIS, ROUTINE W REFLEX MICROSCOPIC
Bilirubin Urine: NEGATIVE
GLUCOSE, UA: NEGATIVE mg/dL
Hgb urine dipstick: NEGATIVE
Ketones, ur: NEGATIVE mg/dL
LEUKOCYTES UA: NEGATIVE
Nitrite: NEGATIVE
PH: 7.5 (ref 5.0–8.0)
Protein, ur: NEGATIVE mg/dL
SPECIFIC GRAVITY, URINE: 1.01 (ref 1.005–1.030)

## 2016-08-22 LAB — HCG, QUANTITATIVE, PREGNANCY: hCG, Beta Chain, Quant, S: 20031 m[IU]/mL — ABNORMAL HIGH (ref <5)

## 2016-08-22 MED ORDER — PROMETHAZINE HCL 25 MG PO TABS: 25.0000 mg | ORAL_TABLET | Freq: Once | ORAL | Status: DC

## 2016-08-22 NOTE — MAU Note (Signed)
Pt presents to MAU with nausea and left sided abdominal pain that she describes as a cramping/burning. Pain started about 2 days ago. Nausea has been going on since august. Worried about baby due to being further along than originally told and on bipolar meds.

## 2016-08-22 NOTE — MAU Provider Note (Signed)
Chief Complaint: Abdominal Pain and Nausea   First Provider Initiated Contact with Patient 08/22/16 0940      SUBJECTIVE HPI: Alicia Rosario is a 35 y.o. G3P2 at [redacted]w[redacted]d who presents to [facility] Admissions reporting left lower pelvic pain.  Patient states pain started 2 days ago and has been worsening. Left lower pelvic area. Pain described as cramping and burning. Does not radiate. Never had anything like this before. Denies F/C. +Nausea, constant since August, but is now vomiting throughout the day starting 2 weeks ago but now with all meals/snacks. Not losing weight, gaining weight. Denies constipation or diarrhea, melena or hematochezia. Only taking Tylenol, with minimal relief. Pain worsens with activity, feels better with rest. She is a Associate Professor. Denies VB, vaginal discharge, dysuria. Patient mentioned she is concerned that she "might be miscarrying" due to the pain, and that is why she is here.  Has taken home pregnancy tests that were positive, and had FM office test that was positive. Went to health dept, told was [redacted] weeks along, based on LMP: 05/18/16 (definite). No imaging has been performed. Had recent STI testing at health department this week, all were normal per patient (Gc/Ct, RPR, HIV).   Has been weaning off of Effexor, slowly, has not been noticing withdrawal symptoms. She has bipolar, with manic episodes she has irritability only.     Past Medical History:  Diagnosis Date  . Bipolar II disorder (HCC)   . Depression   . Hypertension   . Irritable bowel syndrome (IBS)    OB History  Gravida Para Term Preterm AB Living  3 2       2   SAB TAB Ectopic Multiple Live Births               # Outcome Date GA Lbr Len/2nd Weight Sex Delivery Anes PTL Lv  3 Current           2 Para           1 Para              Past Surgical History:  Procedure Laterality Date  . CHOLECYSTECTOMY    . MOUTH SURGERY     Social History   Social History  . Marital status: Single     Spouse name: N/A  . Number of children: N/A  . Years of education: N/A   Occupational History  . Not on file.   Social History Main Topics  . Smoking status: Former Games developer  . Smokeless tobacco: Never Used  . Alcohol use No     Comment: social  . Drug use: No  . Sexual activity: Yes   Other Topics Concern  . Not on file   Social History Narrative  . No narrative on file   No current facility-administered medications on file prior to encounter.    Current Outpatient Prescriptions on File Prior to Encounter  Medication Sig Dispense Refill  . benztropine (COGENTIN) 0.5 MG tablet Take 0.5 mg by mouth at bedtime.    . Carbamazepine (EQUETRO) 100 MG CP12 12 hr capsule Take 200 mg by mouth at bedtime.    . fexofenadine (ALLEGRA) 180 MG tablet Take 180 mg by mouth daily.    . metoCLOPramide (REGLAN) 10 MG tablet Take 10 mg by mouth 4 (four) times daily -  before meals and at bedtime.    . metoprolol (LOPRESSOR) 50 MG tablet Take 50 mg by mouth 2 (two) times daily.    Marland Kitchen omeprazole (PRILOSEC)  40 MG capsule Take 40 mg by mouth daily.    . ondansetron (ZOFRAN) 4 MG tablet Take 1 tablet (4 mg total) by mouth every 6 (six) hours. (Patient not taking: Reported on 07/03/2016) 12 tablet 0  . pantoprazole (PROTONIX) 20 MG tablet Take 1 tablet (20 mg total) by mouth daily. (Patient not taking: Reported on 07/03/2016) 30 tablet 0  . promethazine (PHENERGAN) 25 MG tablet Take 1 tablet (25 mg total) by mouth every 6 (six) hours as needed for nausea or vomiting. 20 tablet 0  . venlafaxine XR (EFFEXOR-XR) 150 MG 24 hr capsule Take 1 capsule (150 mg total) by mouth daily with breakfast. 30 capsule 1  . ziprasidone (GEODON) 60 MG capsule Take 60 mg by mouth at bedtime.     No Known Allergies  I have reviewed the past Medical Hx, Surgical Hx, Social Hx, Allergies and Medications.   REVIEW OF SYSTEMS  OPHTHALMIC: negative for - blurry vision, decreased vision, double vision, photophobia or  scotomata RESPIRATORY: no cough, shortness of breath, or wheezing CARDIOVASCULAR: no chest pain or dyspnea on exertion GASTROINTESTINAL: mild LLQ abdominal pain, change in bowel habits, or black or bloody stools negative for - epigastric or RUQ pain GENITO-URINARY: no dysuria, trouble voiding, or hematuria negative for - genital discharge, vulvar/vaginal symptoms or vaginal bleeding MUSKULOSKELETAL: negative for - gait disturbance or swelling in ankle - bilateral, foot - bilateral and leg - bilateral NEUROLOGICAL: negative for - dizziness, gait disturbance, headaches, numbness/tingling or visual changes DERMATOLOGICAL: negative OBSTETRICAL: No vaginal bleeding, no leaking fluid, no contractions.    OBJECTIVE Patient Vitals for the past 24 hrs:  BP Temp Temp src Pulse Resp Height Weight  08/22/16 0931 133/88 98.4 F (36.9 C) Oral 97 18 5\' 4"  (1.626 m) 216 lb (98 kg)    PHYSICAL EXAM Constitutional: Well-developed, well-nourished female in no acute distress.  Cardiovascular: normal rate Respiratory: normal rate and effort.  GI: Abd soft, mild suprapubic tenderness and left lower quadrant tenderness, gravid appropriate for gestational age. Pos BS x 4 MS: Extremities nontender, no edema, normal ROM Neurologic: Alert and oriented x 4.  GU: Neg CVAT.   LAB RESULTS Results for orders placed or performed during the hospital encounter of 08/22/16 (from the past 24 hour(s))  Urinalysis, Routine w reflex microscopic (not at [facility])     Status: None   Collection Time: 08/22/16  9:20 AM  Result Value Ref Range   Color, Urine YELLOW YELLOW   APPearance CLEAR CLEAR   Specific Gravity, Urine 1.010 1.005 - 1.030   pH 7.5 5.0 - 8.0   Glucose, UA NEGATIVE NEGATIVE mg/dL   Hgb urine dipstick NEGATIVE NEGATIVE   Bilirubin Urine NEGATIVE NEGATIVE   Ketones, ur NEGATIVE NEGATIVE mg/dL   Protein, ur NEGATIVE NEGATIVE mg/dL   Nitrite NEGATIVE NEGATIVE   Leukocytes, UA NEGATIVE NEGATIVE     IMAGING No results found.  MAU COURSE Labs - WNL Recent STI testing at health dept this week, all negative per patient Doppler heart tones 166, mid-pelvic region Pain is not significant, patient feels comfortable going home after reassurance with heart tones and she was only concerned something was wrong with the baby.   MDM Plan of care reviewed with patient, including labs and tests ordered and medical treatment. She is going to follow up with the health department soon for prenatal care.   ASSESSMENT 1. Abdominal pain affecting pregnancy   2. Abdominal pain affecting pregnancy     PLAN Discharge home in stable  condition. Follow up with health department for prenatal care.     Medication List    TAKE these medications   acetaminophen 500 MG tablet Commonly known as:  TYLENOL Take 500 mg by mouth every 6 (six) hours as needed for mild pain, moderate pain or headache.   prenatal multivitamin Tabs tablet Take 1 tablet by mouth daily.   promethazine 25 MG tablet Commonly known as:  PHENERGAN Take 1 tablet (25 mg total) by mouth every 6 (six) hours as needed for nausea or vomiting.   venlafaxine XR 75 MG 24 hr capsule Commonly known as:  EFFEXOR-XR Take 75 mg by mouth daily with breakfast.   venlafaxine XR 37.5 MG 24 hr capsule Commonly known as:  EFFEXOR-XR Take 37.5 mg by mouth daily with breakfast.        Hiram Comber Leda [place], DO 08/22/2016  9:40 AM

## 2016-08-22 NOTE — Discharge Instructions (Signed)

## 2016-09-08 ENCOUNTER — Encounter: Payer: Self-pay | Admitting: Obstetrics

## 2016-09-08 ENCOUNTER — Ambulatory Visit (INDEPENDENT_AMBULATORY_CARE_PROVIDER_SITE_OTHER): Payer: Medicaid Other | Admitting: Obstetrics

## 2016-09-08 VITALS — BP 100/66 | HR 104 | Temp 98.9°F | Wt 215.2 lb

## 2016-09-08 DIAGNOSIS — Z3482 Encounter for supervision of other normal pregnancy, second trimester: Secondary | ICD-10-CM

## 2016-09-08 DIAGNOSIS — Z3490 Encounter for supervision of normal pregnancy, unspecified, unspecified trimester: Secondary | ICD-10-CM

## 2016-09-08 DIAGNOSIS — Z3492 Encounter for supervision of normal pregnancy, unspecified, second trimester: Secondary | ICD-10-CM

## 2016-09-08 NOTE — Progress Notes (Signed)
Subjective:    Alicia Rosario is being seen today for her first obstetrical visit.  This is not a planned pregnancy. She is at 6223w1d gestation. Her obstetrical history is significant for obesity. Relationship with FOB: significant other. Patient does intend to breast feed. Pregnancy history fully reviewed.  The information documented in the HPI was reviewed and verified.  Menstrual History: OB History    Gravida Para Term Preterm AB Living   5 2     2 2    SAB TAB Ectopic Multiple Live Births   2               Patient's last menstrual period was 05/18/2016 (exact date).    Past Medical History:  Diagnosis Date  . Bipolar II disorder (HCC)   . Depression   . Hypertension   . Irritable bowel syndrome (IBS)     Past Surgical History:  Procedure Laterality Date  . CHOLECYSTECTOMY    . MOUTH SURGERY       (Not in a hospital admission) No Known Allergies  Social History  Substance Use Topics  . Smoking status: Former Games developermoker  . Smokeless tobacco: Never Used  . Alcohol use No     Comment: social    Family History  Problem Relation Age of Onset  . Mental illness Mother   . Hyperlipidemia Father   . Cancer Paternal Grandmother     Breast CA     Review of Systems Constitutional: negative for weight loss Gastrointestinal: negative for vomiting Genitourinary:negative for genital lesions and vaginal discharge and dysuria Musculoskeletal:negative for back pain Behavioral/Psych: negative for abusive relationship, depression, illegal drug usage and tobacco use    Objective:    BP 100/66   Pulse (!) 104   Temp 98.9 F (37.2 C)   Wt 215 lb 3.2 oz (97.6 kg)   LMP 05/18/2016 (Exact Date)   BMI 36.94 kg/m  General Appearance:    Alert, cooperative, no distress, appears stated age  Head:    Normocephalic, without obvious abnormality, atraumatic  Eyes:    PERRL, conjunctiva/corneas clear, EOM's intact, fundi    benign, both eyes  Ears:    Normal TM's and external ear  canals, both ears  Nose:   Nares normal, septum midline, mucosa normal, no drainage    or sinus tenderness  Throat:   Lips, mucosa, and tongue normal; teeth and gums normal  Neck:   Supple, symmetrical, trachea midline, no adenopathy;    thyroid:  no enlargement/tenderness/nodules; no carotid   bruit or JVD  Back:     Symmetric, no curvature, ROM normal, no CVA tenderness  Lungs:     Clear to auscultation bilaterally, respirations unlabored  Chest Wall:    No tenderness or deformity   Heart:    Regular rate and rhythm, S1 and S2 normal, no murmur, rub   or gallop  Breast Exam:    No tenderness, masses, or nipple abnormality  Abdomen:     Soft, non-tender, bowel sounds active all four quadrants,    no masses, no organomegaly  Genitalia:    Normal female without lesion, discharge or tenderness  Extremities:   Extremities normal, atraumatic, no cyanosis or edema  Pulses:   2+ and symmetric all extremities  Skin:   Skin color, texture, turgor normal, no rashes or lesions  Lymph nodes:   Cervical, supraclavicular, and axillary nodes normal  Neurologic:   CNII-XII intact, normal strength, sensation and reflexes    throughout  Lab Review Urine pregnancy test Labs reviewed yes Radiologic studies reviewed no Assessment:    Pregnancy at 6948w1d weeks    Plan:      Prenatal vitamins.  Counseling provided regarding continued use of seat belts, cessation of alcohol consumption, smoking or use of illicit drugs; infection precautions i.e., influenza/TDAP immunizations, toxoplasmosis,CMV, parvovirus, listeria and varicella; workplace safety, exercise during pregnancy; routine dental care, safe medications, sexual activity, hot tubs, saunas, pools, travel, caffeine use, fish and methlymercury, potential toxins, hair treatments, varicose veins Weight gain recommendations per IOM guidelines reviewed: underweight/BMI< 18.5--> gain 28 - 40 lbs; normal weight/BMI 18.5 - 24.9--> gain 25 - 35 lbs;  overweight/BMI 25 - 29.9--> gain 15 - 25 lbs; obese/BMI >30->gain  11 - 20 lbs Problem list reviewed and updated. FIRST/CF mutation testing/NIPT/QUAD SCREEN/fragile X/Ashkenazi Jewish population testing/Spinal muscular atrophy discussed: requested. Role of ultrasound in pregnancy discussed; fetal survey: requested. Amniocentesis discussed: not indicated. VBAC calculator score: VBAC consent form provided No orders of the defined types were placed in this encounter.  Orders Placed This Encounter  Procedures  . Culture, OB Urine  . US OB Comp + 14 Wk    Standing Status:   Future    Standing Expiration Date:   11/08/2017    Order Specific Question:   Reason for Exam (SYMPTOM  OR DIAGNOSIS REQUIRED)    Answer:   Anatomic Survey    Order Specific Question:   Preferred imaging location?    Answer:   Internal  . Prenatal Profile I  . HIV antibody  . Hemoglobinopathy evaluation  . Varicella zoster antibody, IgG  . VITAMIN D 25 Hydroxy (Vit-D Deficiency, Fractures)  . AFP, Quad Screen    Order Specific Question:   Maternal Race    Answer:   AA    Order Specific Question:   EDD    Answer:   02/22/2017    Order Specific Question:   Pregnancy Donor Egg (Y/N)    Answer:   No    Order Specific Question:   Gest Age at U/S (Wk.Dy)    Answer:   n/a    Order Specific Question:   Number of Fetuses    Answer:   1    Order Specific Question:   Hx of OSB/NTD?    Answer:   No    Order Specific Question:   History of Down Syndrome?    Answer:   No    Order Specific Question:   Maternal IDDM (insulin-dependent diabetes mellitus)    Answer:   No    Order Specific Question:   Maternal Weight (lbs)    Answer:   215  . ToxASSURE Select 13 (MW), Urine    Follow up in 4 weeks. 50% of 20 min visit spent on counseling and coordination of care. Patient ID: Alicia SchoonerKimberly L Rosario, female   DOB: [birth year], 35 y.o.   MRN: [id number]

## 2016-09-09 ENCOUNTER — Encounter (HOSPITAL_COMMUNITY): Payer: Self-pay | Admitting: *Deleted

## 2016-09-09 ENCOUNTER — Inpatient Hospital Stay (HOSPITAL_COMMUNITY)
Admission: AD | Admit: 2016-09-09 | Discharge: 2016-09-09 | Disposition: A | Discharge status: Home / Self Care | Payer: Medicaid Other | Source: Ambulatory Visit | Attending: Obstetrics and Gynecology | Admitting: Obstetrics and Gynecology

## 2016-09-09 DIAGNOSIS — R12 Heartburn: Secondary | ICD-10-CM | POA: Diagnosis not present

## 2016-09-09 DIAGNOSIS — O26892 Other specified pregnancy related conditions, second trimester: Secondary | ICD-10-CM | POA: Diagnosis not present

## 2016-09-09 DIAGNOSIS — Z3A16 16 weeks gestation of pregnancy: Secondary | ICD-10-CM | POA: Insufficient documentation

## 2016-09-09 DIAGNOSIS — O210 Mild hyperemesis gravidarum: Secondary | ICD-10-CM | POA: Diagnosis not present

## 2016-09-09 DIAGNOSIS — O219 Vomiting of pregnancy, unspecified: Secondary | ICD-10-CM

## 2016-09-09 LAB — CBC
HCT: 36.8 % (ref 36.0–46.0)
Hemoglobin: 13.1 g/dL (ref 12.0–15.0)
MCH: 25.9 pg — AB (ref 26.0–34.0)
MCHC: 35.6 g/dL (ref 30.0–36.0)
MCV: 72.9 fL — ABNORMAL LOW (ref 78.0–100.0)
PLATELETS: 174 10*3/uL (ref 150–400)
RBC: 5.05 MIL/uL (ref 3.87–5.11)
RDW: 13.2 % (ref 11.5–15.5)
WBC: 9.1 10*3/uL (ref 4.0–10.5)

## 2016-09-09 LAB — COMPREHENSIVE METABOLIC PANEL
ALBUMIN: 3.5 g/dL (ref 3.5–5.0)
ALK PHOS: 46 U/L (ref 38–126)
ALT: 30 U/L (ref 14–54)
ANION GAP: 9 (ref 5–15)
AST: 32 U/L (ref 15–41)
BUN: 7 mg/dL (ref 6–20)
CALCIUM: 9.4 mg/dL (ref 8.9–10.3)
CHLORIDE: 105 mmol/L (ref 101–111)
CO2: 21 mmol/L — AB (ref 22–32)
Creatinine, Ser: 0.48 mg/dL (ref 0.44–1.00)
GFR calc non Af Amer: >60 mL/min (ref >60)
GLUCOSE: 67 mg/dL (ref 65–99)
POTASSIUM: 4.2 mmol/L (ref 3.5–5.1)
SODIUM: 135 mmol/L (ref 135–145)
Total Bilirubin: 0.5 mg/dL (ref 0.3–1.2)
Total Protein: 7.1 g/dL (ref 6.5–8.1)

## 2016-09-09 LAB — URINALYSIS, ROUTINE W REFLEX MICROSCOPIC
BILIRUBIN URINE: NEGATIVE
GLUCOSE, UA: NEGATIVE mg/dL
Hgb urine dipstick: NEGATIVE
KETONES UR: NEGATIVE mg/dL
LEUKOCYTES UA: NEGATIVE
Nitrite: NEGATIVE
PH: 8.5 — AB (ref 5.0–8.0)
PROTEIN: NEGATIVE mg/dL
Specific Gravity, Urine: 1.02 (ref 1.005–1.030)

## 2016-09-09 MED ORDER — GLYCOPYRROLATE 0.2 MG/ML IJ SOLN
0.2000 mg | Freq: Once | INTRAMUSCULAR | Status: AC
  Administered 2016-09-09: 0.2 mg via INTRAVENOUS
  Filled 2016-09-09: qty 1

## 2016-09-09 MED ORDER — ONDANSETRON 4 MG PO TBDP: 4.0000 mg | ORAL_TABLET | Freq: Three times a day (TID) | ORAL | 0 refills | Status: DC | PRN

## 2016-09-09 MED ORDER — LACTATED RINGERS IV BOLUS (SEPSIS)
1000.0000 mL | Freq: Once | INTRAVENOUS | Status: AC
  Administered 2016-09-09: 1000 mL via INTRAVENOUS

## 2016-09-09 MED ORDER — PROMETHAZINE HCL 25 MG PO TABS: 25.0000 mg | ORAL_TABLET | Freq: Four times a day (QID) | ORAL | 0 refills | Status: DC | PRN

## 2016-09-09 MED ORDER — RANITIDINE HCL 150 MG PO TABS: 150.0000 mg | ORAL_TABLET | Freq: Two times a day (BID) | ORAL | 0 refills | Status: DC

## 2016-09-09 MED ORDER — ONDANSETRON HCL 4 MG/2ML IJ SOLN
4.0000 mg | Freq: Once | INTRAMUSCULAR | Status: AC
Start: 2016-09-09 — End: 2016-09-09
  Administered 2016-09-09: 4 mg via INTRAVENOUS
  Filled 2016-09-09: qty 2

## 2016-09-09 MED ORDER — FAMOTIDINE IN NACL 20-0.9 MG/50ML-% IV SOLN
20.0000 mg | Freq: Once | INTRAVENOUS | Status: AC
  Administered 2016-09-09: 20 mg via INTRAVENOUS
  Filled 2016-09-09: qty 50

## 2016-09-09 NOTE — MAU Provider Note (Signed)
History     CSN: [id number]  Arrival date and time: 09/09/16 1016   First Provider Initiated Contact with Patient 09/09/16 1129      Chief Complaint  Patient presents with  . Morning Sickness  . Headache   HPI Alicia Rosario is a 35 y.o. [id number] at 2975w2d who presents with n/v & headache. Reports daily n/v for the last 3+ weeks. Has vomited 3 times today. Attempted to eat bacon egg & cheese biscuit, and pizza but was unable to keep it down. Endorses heartburn. States she used to be on heartburn medicine before pregnancy but was taken off of it by her PCP when she became pregnant. Reports headache today. States she thinks the headache is due to dehydration and vomiting. Rates 7/10. Has taken tylenol without relief.  Denies abdominal pain, vaginal bleeding, LOF, diarrhea, constipation,   OB History    Gravida Para Term Preterm AB Living   5 2     2 2    SAB TAB Ectopic Multiple Live Births   2              Past Medical History:  Diagnosis Date  . Bipolar II disorder (HCC)   . Depression   . Hypertension   . Irritable bowel syndrome (IBS)     Past Surgical History:  Procedure Laterality Date  . CHOLECYSTECTOMY    . MOUTH SURGERY      Family History  Problem Relation Age of Onset  . Mental illness Mother   . Hyperlipidemia Father   . Cancer Paternal Grandmother     Breast CA    Social History  Substance Use Topics  . Smoking status: Former Games developermoker  . Smokeless tobacco: Never Used  . Alcohol use No     Comment: social    Allergies: No Known Allergies  Prescriptions Prior to Admission  Medication Sig Dispense Refill Last Dose  . acetaminophen (TYLENOL) 500 MG tablet Take 1,000 mg by mouth every 6 (six) hours as needed for mild pain, moderate pain or headache.    09/08/2016 at 2230  . calcium carbonate (TUMS - DOSED IN MG ELEMENTAL CALCIUM) 500 MG chewable tablet Chew 2 tablets by mouth as needed for indigestion or heartburn.   09/09/2016 at Unknown time  .  Prenatal Vit-Fe Fumarate-FA (PRENATAL MULTIVITAMIN) TABS tablet Take 1 tablet by mouth daily.   09/09/2016 at Unknown time    Review of Systems  Constitutional: Negative.   Gastrointestinal: Positive for heartburn, nausea and vomiting. Negative for abdominal pain, constipation and diarrhea.  Genitourinary: Negative.   Neurological: Positive for headaches.   Physical Exam   Blood pressure 116/84, pulse 72, temperature 98.1 F (36.7 C), temperature source Oral, resp. rate 16, weight 214 lb 6.4 oz (97.3 kg), last menstrual period 05/18/2016.  Physical Exam  Nursing note and vitals reviewed. Constitutional: She is oriented to person, place, and time. She appears well-developed and well-nourished. No distress.  HENT:  Head: Normocephalic and atraumatic.  Eyes: Conjunctivae are normal. Right eye exhibits no discharge. Left eye exhibits no discharge. No scleral icterus.  Neck: Normal range of motion.  Cardiovascular: Normal rate, regular rhythm and normal heart sounds.   No murmur heard. Respiratory: Effort normal and breath sounds normal. No respiratory distress. She has no wheezes.  GI: Soft. Bowel sounds are normal. There is no tenderness.  Neurological: She is alert and oriented to person, place, and time.  Skin: Skin is warm and dry. She is not diaphoretic.  Psychiatric: She  has a normal mood and affect. Her behavior is normal. Judgment and thought content normal.    MAU Course  Procedures Results for orders placed or performed during the hospital encounter of 09/09/16 (from the past 24 hour(s))  Urinalysis, Routine w reflex microscopic ([facility])     Status: Abnormal   Collection Time: 09/09/16 11:12 AM  Result Value Ref Range   Color, Urine YELLOW YELLOW   APPearance CLEAR CLEAR   Specific Gravity, Urine 1.020 1.005 - 1.030   pH 8.5 (H) 5.0 - 8.0   Glucose, UA NEGATIVE NEGATIVE mg/dL   Hgb urine dipstick NEGATIVE NEGATIVE   Bilirubin Urine NEGATIVE NEGATIVE   Ketones, ur  NEGATIVE NEGATIVE mg/dL   Protein, ur NEGATIVE NEGATIVE mg/dL   Nitrite NEGATIVE NEGATIVE   Leukocytes, UA NEGATIVE NEGATIVE  CBC     Status: Abnormal   Collection Time: 09/09/16 12:17 PM  Result Value Ref Range   WBC 9.1 4.0 - 10.5 K/uL   RBC 5.05 3.87 - 5.11 MIL/uL   Hemoglobin 13.1 12.0 - 15.0 g/dL   HCT 16.136.8 09.636.0 - 04.546.0 %   MCV 72.9 (L) 78.0 - 100.0 fL   MCH 25.9 (L) 26.0 - 34.0 pg   MCHC 35.6 30.0 - 36.0 g/dL   RDW 40.913.2 81.111.5 - 91.415.5 %   Platelets 174 150 - 400 K/uL  Comprehensive metabolic panel     Status: Abnormal   Collection Time: 09/09/16 12:17 PM  Result Value Ref Range   Sodium 135 135 - 145 mmol/L   Potassium 4.2 3.5 - 5.1 mmol/L   Chloride 105 101 - 111 mmol/L   CO2 21 (L) 22 - 32 mmol/L   Glucose, Bld 67 65 - 99 mg/dL   BUN 7 6 - 20 mg/dL   Creatinine, Ser 7.820.48 0.44 - 1.00 mg/dL   Calcium 9.4 8.9 - 95.610.3 mg/dL   Total Protein 7.1 6.5 - 8.1 g/dL   Albumin 3.5 3.5 - 5.0 g/dL   AST 32 15 - 41 U/L   ALT 30 14 - 54 U/L   Alkaline Phosphatase 46 38 - 126 U/L   Total Bilirubin 0.5 0.3 - 1.2 mg/dL   GFR calc non Af Amer >60 >60 mL/min   GFR calc Af Amer >60 >60 mL/min   Anion gap 9 5 - 15    MDM FHT 156 by doppler LR bolus, zofran 4 mg IV, pepcid 20 mg IV, robinul 0.2 mg IV Pt reports improvement in symptoms & resolution of headache Not observed vomiting during mau visit  Assessment and Plan  A: 1. Nausea and vomiting during pregnancy prior to [redacted] weeks gestation   2. Heartburn during pregnancy in second trimester    P: Discharge home Rx zofran, phenergan, & zantac Keep f/u with ob Discussed reasons to return to MAU Educated regarding better food choices for n/v  Alicia Rosario 09/09/2016, 11:22 AM

## 2016-09-09 NOTE — Discharge Instructions (Signed)
Heartburn During Pregnancy °Heartburn is pain or discomfort in the throat or chest. It may cause a burning feeling. It happens when stomach acid moves up into the tube that carries food from your mouth to your stomach (esophagus). Heartburn is common during pregnancy. It usually goes away or gets better after giving birth. °Follow these instructions at home: °Eating and drinking °· Do not drink alcohol while you are pregnant. °· Figure out which foods and beverages make you feel worse, and avoid them. °· Beverages that you may want to avoid include: °? Coffee and tea (with or without caffeine). °? Energy drinks and sports drinks. °? Bubbly (carbonated) drinks or sodas. °? Citrus fruit juices. °· Foods that you may want to avoid include: °? Chocolate and cocoa. °? Peppermint and mint flavorings. °? Garlic, onions, and horseradish. °? Spicy and acidic foods. These include peppers, chili powder, curry powder, vinegar, hot sauces, and barbecue sauce. °? Citrus fruits, such as oranges, lemons, and limes. °? Tomato-based foods, such as red sauce, chili, and salsa. °? Fried and fatty foods, such as donuts, french fries, potato chips, and high-fat dressings. °? High-fat meats, such as hot dogs, cold cuts, sausage, ham, and bacon. °? High-fat dairy items, such as whole milk, butter, and cheese. °· Eat small meals often, instead of large meals. °· Avoid drinking a lot of liquid with your meals. °· Avoid eating meals during the 2-3 hours before you go to bed. °· Avoid lying down right after you eat. °· Do not exercise right after you eat. °Medicines °· Take over-the-counter and prescription medicines only as told by your doctor. °· Do not take aspirin, ibuprofen, or other NSAIDs unless your doctor tells you to do that. °· Your doctor may tell you to avoid medicines that have sodium bicarbonate in them. °General instructions °· If told, raise the head of your bed about 6 inches (15 cm). You can do this by putting blocks under  the legs. Sleeping with more pillows does not help with heartburn. °· Do not use any products that contain nicotine or tobacco, such as cigarettes and e-cigarettes. If you need help quitting, ask your doctor. °· Wear loose-fitting clothing. °· Try to lower your stress, such as with yoga or meditation. If you need help, ask your doctor. °· Stay at a healthy weight. If you are overweight, work with your doctor to safely lose weight. °· Keep all follow-up visits as told by your doctor. This is important. °Contact a doctor if: °· You get new symptoms. °· Your symptoms do not get better with treatment. °· You have weight loss and you do not know why. °· You have trouble swallowing. °· You make loud sounds when you breathe (wheeze). °· You have a cough that does not go away. °· You have heartburn often for more than 2 weeks. °· You feel sick to your stomach (nauseous), and this does not get better with treatment. °· You are throwing up (vomiting), and this does not get better with treatment. °· You have pain in your belly (abdomen). °Get help right away if: °· You have very bad chest pain that spreads to your arm, neck, or jaw. °· You feel sweaty, dizzy, or light-headed. °· You have trouble breathing. °· You have pain when swallowing. °· You throw up and your throw-up looks like blood or coffee grounds. °· Your poop (stool) is bloody or black. °This information is not intended to replace advice given to you by your health care provider.   Make sure you discuss any questions you have with your health care provider. Document Released: 11/15/2010 Document Revised: 06/30/2016 Document Reviewed: 06/30/2016 Elsevier Interactive Patient Education  2017 [employer] Morning Sickness Morning sickness is when you feel sick to your stomach (nauseous) during pregnancy. This nauseous feeling may or may not come with vomiting. It often occurs in the morning but can be a problem any time of day. Morning sickness is most common  during the first trimester, but it may continue throughout pregnancy. While morning sickness is unpleasant, it is usually harmless unless you develop severe and continual vomiting (hyperemesis gravidarum). This condition requires more intense treatment. What are the causes? The cause of morning sickness is not completely known but seems to be related to normal hormonal changes that occur in pregnancy. What increases the risk? You are at greater risk if you:  Experienced nausea or vomiting before your pregnancy.  Had morning sickness during a previous pregnancy.  Are pregnant with more than one baby, such as twins. How is this treated? Do not use any medicines (prescription, over-the-counter, or herbal) for morning sickness without first talking to your health care provider. Your health care provider may prescribe or recommend:  Vitamin B6 supplements.  Anti-nausea medicines.  The herbal medicine ginger. Follow these instructions at home:  Only take over-the-counter or prescription medicines as directed by your health care provider.  Taking multivitamins before getting pregnant can prevent or decrease the severity of morning sickness in most women.  Eat a piece of dry toast or unsalted crackers before getting out of bed in the morning.  Eat five or six small meals a day.  Eat dry and bland foods (rice, baked potato). Foods high in carbohydrates are often helpful.  Do not drink liquids with your meals. Drink liquids between meals.  Avoid greasy, fatty, and spicy foods.  Get someone to cook for you if the smell of any food causes nausea and vomiting.  If you feel nauseous after taking prenatal vitamins, take the vitamins at night or with a snack.  Snack on protein foods (nuts, yogurt, cheese) between meals if you are hungry.  Eat unsweetened gelatins for desserts.  Wearing an acupressure wristband (worn for sea sickness) may be helpful.  Acupuncture may be helpful.  Do not  smoke.  Get a humidifier to keep the air in your house free of odors.  Get plenty of fresh air. Contact a health care provider if:  Your home remedies are not working, and you need medicine.  You feel dizzy or lightheaded.  You are losing weight. Get help right away if:  You have persistent and uncontrolled nausea and vomiting.  You pass out (faint). This information is not intended to replace advice given to you by your health care provider. Make sure you discuss any questions you have with your health care provider. Document Released: 12/04/2006 Document Revised: 03/20/2016 Document Reviewed: 03/30/2013 Elsevier Interactive Patient Education  2017 [employer]

## 2016-09-09 NOTE — MAU Note (Signed)
According to the health dept, she is 16wks..  Didn't start vomiting until 2nd trimester.  Is getting worse.  Having worsening headaches.  According to  Primary, who did bloodwork -  She would only be 7 wks.

## 2016-09-10 LAB — URINE CULTURE, OB REFLEX

## 2016-09-10 LAB — CULTURE, OB URINE

## 2016-09-11 LAB — NUSWAB VG+, CANDIDA 6SP
CANDIDA GLABRATA, NAA: NEGATIVE
CANDIDA KRUSEI, NAA: NEGATIVE
CANDIDA LUSITANIAE, NAA: NEGATIVE
CHLAMYDIA TRACHOMATIS, NAA: NEGATIVE
Candida albicans, NAA: NEGATIVE
Candida parapsilosis, NAA: NEGATIVE
Candida tropicalis, NAA: NEGATIVE
NEISSERIA GONORRHOEAE, NAA: NEGATIVE
TRICH VAG BY NAA: NEGATIVE

## 2016-09-15 ENCOUNTER — Other Ambulatory Visit: Payer: Self-pay | Admitting: Obstetrics

## 2016-09-15 DIAGNOSIS — D582 Other hemoglobinopathies: Secondary | ICD-10-CM

## 2016-09-15 LAB — AFP, QUAD SCREEN
DIA MOM VALUE: 0.45
DIA Value (EIA): 66.7 pg/mL
DSR (BY AGE) 1 IN: 389
DSR (Second Trimester) 1 IN: 7835
Gestational Age: 16.1 WEEKS
MSAFP Mom: 1.79
MSAFP: 52.3 ng/mL
MSHCG MOM: 0.55
MSHCG: 16936 m[IU]/mL
Maternal Age At EDD: 33.7 YEARS
Osb Risk: 2617
TEST RESULTS AFP: NEGATIVE
UE3 VALUE: 0.72 ng/mL
Weight: 215 [lb_av]
uE3 Mom: 0.95

## 2016-09-15 LAB — HIV ANTIBODY (ROUTINE TESTING W REFLEX): HIV Screen 4th Generation wRfx: NONREACTIVE

## 2016-09-15 LAB — PRENATAL PROFILE I(LABCORP)
Antibody Screen: NEGATIVE
Basophils Absolute: 0 x10E3/uL (ref 0.0–0.2)
Basos: 0 %
EOS (ABSOLUTE): 0.4 x10E3/uL (ref 0.0–0.4)
Eos: 4 %
Hematocrit: 39 % (ref 34.0–46.6)
Hemoglobin: 13.1 g/dL (ref 11.1–15.9)
Hepatitis B Surface Ag: NEGATIVE
Immature Grans (Abs): 0.1 x10E3/uL (ref 0.0–0.1)
Immature Granulocytes: 1 %
Lymphocytes Absolute: 2.2 x10E3/uL (ref 0.7–3.1)
Lymphs: 23 %
MCH: 25.6 pg — ABNORMAL LOW (ref 26.6–33.0)
MCHC: 33.6 g/dL (ref 31.5–35.7)
MCV: 76 fL — ABNORMAL LOW (ref 79–97)
Monocytes Absolute: 0.9 x10E3/uL (ref 0.1–0.9)
Monocytes: 9 %
Neutrophils Absolute: 5.8 x10E3/uL (ref 1.4–7.0)
Neutrophils: 63 %
Platelets: 226 x10E3/uL (ref 150–379)
RBC: 5.11 x10E6/uL (ref 3.77–5.28)
RDW: 14.3 % (ref 12.3–15.4)
RPR Ser Ql: NONREACTIVE
Rh Factor: POSITIVE
Rubella Antibodies, IGG: 1.34 {index}
WBC: 9.3 x10E3/uL (ref 3.4–10.8)

## 2016-09-15 LAB — HEMOGLOBINOPATHY EVALUATION
HEMOGLOBIN F QUANTITATION: 0 % (ref 0.0–2.0)
HGB A: 64.4 % — AB (ref 94.0–98.0)
HGB C: 32.4 % — ABNORMAL HIGH
HGB S: 0 %
Hemoglobin A2 Quantitation: 3.2 % — ABNORMAL HIGH (ref 0.7–3.1)

## 2016-09-15 LAB — VITAMIN D 25 HYDROXY (VIT D DEFICIENCY, FRACTURES): Vit D, 25-Hydroxy: 17.4 ng/mL — ABNORMAL LOW (ref 30.0–100.0)

## 2016-09-15 LAB — VARICELLA ZOSTER ANTIBODY, IGG: Varicella zoster IgG: 2056 index (ref >165)

## 2016-09-15 LAB — TOXASSURE SELECT 13 (MW), URINE

## 2016-09-23 ENCOUNTER — Other Ambulatory Visit: Payer: Self-pay | Admitting: Obstetrics

## 2016-09-23 ENCOUNTER — Ambulatory Visit: Payer: Medicaid Other

## 2016-09-23 DIAGNOSIS — Z3490 Encounter for supervision of normal pregnancy, unspecified, unspecified trimester: Secondary | ICD-10-CM

## 2016-09-23 DIAGNOSIS — Z0489 Encounter for examination and observation for other specified reasons: Secondary | ICD-10-CM

## 2016-09-23 DIAGNOSIS — IMO0002 Reserved for concepts with insufficient information to code with codable children: Secondary | ICD-10-CM

## 2016-09-24 ENCOUNTER — Telehealth: Payer: Self-pay | Admitting: *Deleted

## 2016-09-24 NOTE — Telephone Encounter (Signed)
Patient is calling with nausea and vomiting. 2:40  Patient is having a hard time keeping anything down- Patient had nausea her first trimester- but vomiting has been during the second. Patient states the Phenergan does not work- she has had some success with the Zofran. Patient would like to have something called in so she can work. We have not tried the Diclegis

## 2016-09-25 ENCOUNTER — Other Ambulatory Visit: Payer: Self-pay | Admitting: Obstetrics

## 2016-09-25 DIAGNOSIS — O219 Vomiting of pregnancy, unspecified: Secondary | ICD-10-CM

## 2016-09-25 MED ORDER — ONDANSETRON HCL 8 MG PO TABS: 8.0000 mg | ORAL_TABLET | Freq: Three times a day (TID) | ORAL | 5 refills | Status: DC | PRN

## 2016-09-25 NOTE — Telephone Encounter (Signed)
Zofran Rx 

## 2016-10-06 ENCOUNTER — Ambulatory Visit (INDEPENDENT_AMBULATORY_CARE_PROVIDER_SITE_OTHER): Payer: Medicaid Other | Admitting: Obstetrics

## 2016-10-06 ENCOUNTER — Encounter: Payer: Self-pay | Admitting: Obstetrics

## 2016-10-06 VITALS — BP 136/83 | HR 91 | Temp 98.4°F | Wt 218.8 lb

## 2016-10-06 DIAGNOSIS — J00 Acute nasopharyngitis [common cold]: Secondary | ICD-10-CM

## 2016-10-06 DIAGNOSIS — K219 Gastro-esophageal reflux disease without esophagitis: Secondary | ICD-10-CM

## 2016-10-06 DIAGNOSIS — Z3480 Encounter for supervision of other normal pregnancy, unspecified trimester: Secondary | ICD-10-CM

## 2016-10-06 DIAGNOSIS — Z3482 Encounter for supervision of other normal pregnancy, second trimester: Secondary | ICD-10-CM | POA: Diagnosis not present

## 2016-10-06 LAB — POCT RAPID STREP A (OFFICE): Rapid Strep A Screen: NEGATIVE

## 2016-10-06 MED ORDER — DM-GUAIFENESIN ER 30-600 MG PO TB12: 1.0000 | ORAL_TABLET | Freq: Two times a day (BID) | ORAL | 2 refills | Status: DC | PRN

## 2016-10-06 MED ORDER — RANITIDINE HCL 150 MG PO TABS: 150.0000 mg | ORAL_TABLET | Freq: Two times a day (BID) | ORAL | 5 refills | Status: DC

## 2016-10-06 NOTE — Addendum Note (Signed)
Addended by: Francene FindersJAMES, QUINETTA C on: 10/06/2016 11:07 AM   Modules accepted: Orders

## 2016-10-06 NOTE — Progress Notes (Signed)
Subjective:    Alicia Rosario is a 35 y.o. female being seen today for her obstetrical visit. She is at 7993w1d gestation. Patient reports: no complaints . Fetal movement: normal.  Problem List Items Addressed This Visit    None    Visit Diagnoses    Supervision of other normal pregnancy, antepartum    -  Primary   GERD without esophagitis       Relevant Medications   ranitidine (ZANTAC) 150 MG tablet   Acute nasopharyngitis       Relevant Medications   dextromethorphan-guaiFENesin (MUCINEX DM) 30-600 MG 12hr tablet     Patient Active Problem List   Diagnosis Date Noted  . 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 Suicidal ideation 10/02/2014  . Bipolar II disorder (HCC) 08/02/2014  . GAD (generalized anxiety disorder) 08/02/2014  . Mood disorder (HCC) 06/26/2014  . Insomnia 06/26/2014  . IBS (irritable bowel syndrome) 04/19/2014  . 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 MDD (major depressive disorder) 04/19/2014  . Abdominal pain, unspecified site 02/22/2014  . Carpal tunnel syndrome 02/22/2014  . Diarrhea 02/22/2014  . Dyshidrotic eczema 02/22/2014   Objective:    BP 136/83   Pulse 91   Temp 98.4 F (36.9 C)   Wt 218 lb 12.8 oz (99.2 kg)   LMP 05/18/2016 (Exact Date)   BMI 37.56 kg/m  FHT: 150 BPM  Uterine Size: size equals dates     Assessment:    Pregnancy @ 5993w1d    Plan:    Signs and symptoms of preterm labor: discussed.  Labs, problem list reviewed and updated 2 hr GTT planned Follow up in 4 weeks.

## 2016-10-14 ENCOUNTER — Encounter: Payer: Self-pay | Admitting: *Deleted

## 2016-10-14 ENCOUNTER — Ambulatory Visit: Payer: Medicaid Other

## 2016-10-14 DIAGNOSIS — Z0489 Encounter for examination and observation for other specified reasons: Secondary | ICD-10-CM

## 2016-10-14 DIAGNOSIS — IMO0002 Reserved for concepts with insufficient information to code with codable children: Secondary | ICD-10-CM

## 2016-10-22 ENCOUNTER — Telehealth: Payer: Self-pay

## 2016-10-22 NOTE — Telephone Encounter (Signed)
Called patient and advised of lab results, transferred to scheduler for appt.

## 2016-10-28 ENCOUNTER — Telehealth: Payer: Self-pay | Admitting: *Deleted

## 2016-10-28 NOTE — Telephone Encounter (Signed)
Pt called to office stating her nausea medication is not working. Ask for return call.  Attempt to contact pt.  No answer, voicemail full-unable to leave message.

## 2016-10-29 ENCOUNTER — Ambulatory Visit (HOSPITAL_COMMUNITY)
Admission: RE | Admit: 2016-10-29 | Discharge: 2016-10-29 | Disposition: A | Discharge status: Home / Self Care | Payer: Medicaid Other | Source: Ambulatory Visit | Attending: Obstetrics | Admitting: Obstetrics

## 2016-10-29 DIAGNOSIS — Z3A23 23 weeks gestation of pregnancy: Secondary | ICD-10-CM

## 2016-10-29 DIAGNOSIS — Z148 Genetic carrier of other disease: Secondary | ICD-10-CM

## 2016-10-29 DIAGNOSIS — Z315 Encounter for genetic counseling: Secondary | ICD-10-CM

## 2016-10-29 NOTE — Progress Notes (Signed)
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  In summary:  Discussed hemoglobin disorders  Reviewed autosomal recessive inheritance  Discussed options of screening / testing  Declined carrier screening for the father of the baby  Discussed general population carrier screening options - declined  CF  SMA  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  She stated that she has been aware since her first pregnancy that she had a hemoglobin variant, but thought that she carried sickle cell anemia.  We discussed that the typical hemoglobin, hemoglobin A (HgA) is a tetramer composed of two alpha chains and two beta chains.  When there is a change in one of the beta chains a different type of hemoglobin is formed.  A person with typical beta chains is said to have hemoglobin AA, whereas a person with one abnormal beta chain, specifically with the C mutation, is considered to have hemoglobin AC.    We reviewed genes and chromosomes. She was counseled that hemoglobinopathies are inherited in an autosomal recessive manner, and occur when both copies of the hemoglobin gene are changed and produce an abnormal hemoglobin. Typically, one abnormal gene is inherited from each parent. Both the mother and the father of the baby must carry a variant hemoglobin such as hemoglobin S or C in order for a pregnancy to be at increased risk for a disease such as sickle cell disease (hemoglobin SS) or hemoglobin C disease (hemoglobin CC) in the pregnancy.  Since Ms. SClareydoes not carry hemoglobin S, she carries hemoglobin C, this and future pregnancies are not expected to be at risk to inherit sickle  cell disease (hemoglobin SS), but could be at increased risk for [place] disease or CC disease if the father of the baby were also to be a carrier. Individuals with hemoglobin [place] disease are typically described as having milder features than individuals with typical sickle cell anemia (HgSS). Hemoglobin C disease is generally a benign condition, although it can be associated with mild hemolytic anemia and/or splenomegaly ).  If an enlarged spleen is painful, sometimes a splenectomy is performed.    Given the recessive inheritance, we discussed the importance of understanding Mr. BSaul Fordycecarrier status in order to accurately predict the risk of a hemoglobinopathy in the fetus. Ms. SKleinmanreported that Mr. BOwens Sharkhas not had screening for hemoglobin disorders, to her knowledge, and has no known relatives with a hemoglobinopathy. Prior to carrier screening, he would have the general population chance of approximately 1 in 12 to have a hemoglobin variant, meaning that the pregnancy has an approximate 1 in 48 chance for a hemoglobinopathy. When both parents are identified carriers, prenatal diagnosis via amniocentesis would be available.  [name] that she would rather not know in advance or worry in advance.  She is fine with the chances as they are and did not wish to have Mr. BOwens Sharkpursue further screening.  She understands that an accurate risk assessment cannot be performed without further information. We also reviewed [place]   We also reviewed the family history in detail.  Ms. SRenaudreported that her niece has sickle cell disease (may be [place] disease  not SS disease given that Ms. Lindenbaum has hemoglobin C).  Her niece's daughter also has sickle cell disease.  The family histories were otherwise found to be noncontributory for birth defects, mental retardation, and known genetic conditions. [name] was concerned about the history of bipolar  disorder and other mental health conditions in her family as well as that of the father of the baby.  We discussed that for the majority of cases of mental health conditions, an underlying genetic cause is not known, but a combination of genetic and environmental factors (multifactorial inheritance) are suspected to contribute to their onset. She understands that prenatal testing or screening is not available for the majority of mental health conditions.  Without further information regarding the provided family history, an accurate genetic risk cannot be calculated. Further genetic counseling is warranted if more information is obtained.  [name] was provided with written information regarding cystic fibrosis (CF), spinal muscular atrophy (SMA) and hemoglobinopathies including the carrier frequency, availability of carrier screening and prenatal diagnosis if indicated.  In addition, we discussed that CF and hemoglobinopathies are routinely screened for as part of the [place] newborn screening panel.  After further discussion, she  declined screening for CF and SMA.  [name] denied exposure to environmental toxins or chemical agents. She denied the use of alcohol, tobacco or street drugs. She denied significant viral illnesses during the course of her pregnancy. Her medical and surgical histories were noncontributory.   I counseled [name] regarding the above risks and available options.  The approximate face-to-face time with the genetic counselor was 60 minutes.  Cam Hai, MS Certified Genetic Counselor

## 2016-11-03 ENCOUNTER — Ambulatory Visit (INDEPENDENT_AMBULATORY_CARE_PROVIDER_SITE_OTHER): Payer: Medicaid Other | Admitting: Obstetrics and Gynecology

## 2016-11-03 DIAGNOSIS — Z3482 Encounter for supervision of other normal pregnancy, second trimester: Secondary | ICD-10-CM

## 2016-11-03 DIAGNOSIS — Z3480 Encounter for supervision of other normal pregnancy, unspecified trimester: Secondary | ICD-10-CM

## 2016-11-03 MED ORDER — DOCUSATE SODIUM 100 MG PO CAPS: 100.0000 mg | ORAL_CAPSULE | Freq: Two times a day (BID) | ORAL | 2 refills | Status: DC | PRN

## 2016-11-03 NOTE — Progress Notes (Signed)
Patient states that she is has constant constipation and that taking prenatals is causing it, patient also states that she thinks she lost her mucous plug, reports good fetal movement.

## 2016-11-03 NOTE — Progress Notes (Signed)
   PRENATAL VISIT NOTE  Subjective:  Alicia Rosario is a 36 y.o. [id number] at 7633w1d being seen today for ongoing prenatal care.  She is currently monitored for the following issues for this low-risk pregnancy and has Carpal tunnel syndrome; IBS (irritable bowel syndrome); Neuropathy of right hand; MDD (major depressive disorder); Mood disorder (HCC); Bipolar II disorder (HCC); GAD (generalized anxiety disorder); Self-mutilation; Suicidal ideation; and Supervision of other normal pregnancy, antepartum on her problem list.  Patient reports constipation, insomnia, pelvic pressure and return of nausea/emesis.  Contractions: Irritability. Vag. Bleeding: None.  Movement: Present. Denies leaking of fluid.   The following portions of the patient's history were reviewed and updated as appropriate: allergies, current medications, past family history, past medical history, past social history, past surgical history and problem list. Problem list updated.  Objective:   Vitals:   11/03/16 1416  BP: 127/84  Pulse: 91  Weight: 215 lb (97.5 kg)    Fetal Status: Fetal Heart Rate (bpm): 145 Fundal Height: 24 cm Movement: Present     General:  Alert, oriented and cooperative. Patient is in no acute distress.  Skin: Skin is warm and dry. No rash noted.   Cardiovascular: Normal heart rate noted  Respiratory: Normal respiratory effort, no problems with respiration noted  Abdomen: Soft, gravid, appropriate for gestational age. Pain/Pressure: Present     Pelvic:  Cervical exam deferred        Extremities: Normal range of motion.  Edema: None  Mental Status: Normal mood and affect. Normal behavior. Normal judgment and thought content.   Assessment and Plan:  Pregnancy: [id number] at 2133w1d  1. Supervision of other normal pregnancy, antepartum 1) Constipation- Encouraged patient to consume fiber rich foods or fiber supplements, use colace daily and increase water intake 2) Insomnia- Patient would like to defer  treatment until she sees her therapist tomorrow. Informed patient that I am happy to provide a sleeping agent in the event that therapist does not feel comfortable given pregnancy state 3) Pelvic pressure- Advised the use of a maternity support belt 4) nausea/emesis- Encouraged patient to continue the use of zofran. She is able to eat her meals daily and knows to stay hydrated Follow up growth ultrasound ordered Third trimester labs and glucola with next visit - US MFM OB COMP + 14 WK; Future  Preterm labor symptoms and general obstetric precautions including but not limited to vaginal bleeding, contractions, leaking of fluid and fetal movement were reviewed in detail with the patient. Please refer to After Visit Summary for other counseling recommendations.  Return in about 4 weeks (around 12/01/2016) for ROB and 2 hr glucola.   Catalina AntiguaPeggy Deja Kaigler, MD

## 2016-11-10 ENCOUNTER — Ambulatory Visit (HOSPITAL_COMMUNITY)
Admission: RE | Admit: 2016-11-10 | Discharge: 2016-11-10 | Disposition: A | Discharge status: Home / Self Care | Payer: Medicaid Other | Source: Ambulatory Visit | Attending: Obstetrics and Gynecology | Admitting: Obstetrics and Gynecology

## 2016-11-10 ENCOUNTER — Other Ambulatory Visit: Payer: Self-pay | Admitting: Obstetrics and Gynecology

## 2016-11-10 DIAGNOSIS — Z3480 Encounter for supervision of other normal pregnancy, unspecified trimester: Secondary | ICD-10-CM

## 2016-11-10 DIAGNOSIS — Z363 Encounter for antenatal screening for malformations: Secondary | ICD-10-CM | POA: Diagnosis present

## 2016-11-10 DIAGNOSIS — Z3A25 25 weeks gestation of pregnancy: Secondary | ICD-10-CM | POA: Diagnosis not present

## 2016-11-10 DIAGNOSIS — O99212 Obesity complicating pregnancy, second trimester: Secondary | ICD-10-CM | POA: Diagnosis not present

## 2016-11-27 ENCOUNTER — Other Ambulatory Visit: Payer: Self-pay

## 2016-11-27 DIAGNOSIS — O219 Vomiting of pregnancy, unspecified: Secondary | ICD-10-CM

## 2016-11-27 MED ORDER — DOXYLAMINE-PYRIDOXINE 10-10 MG PO TBEC: 2.0000 | DELAYED_RELEASE_TABLET | Freq: Every day | ORAL | 5 refills | Status: DC

## 2016-11-27 NOTE — Progress Notes (Signed)
TC from pt declines 2 gtt scheduled on 12/01/16. Pt said if she does not eat at 3 am, she will wake up vomiting stomach acid. She said she needs to gain weight and can not fast at this time. Pt informed to discuss 2 gtt concern with provider on Monday. Pt also c/o N&V. Phenergan does not work it makes her sleepy and no relief with Zofran it causes constipation. Consulted with Dr. Clearance CootsHarper. Pt may discontinue Phenergan and Zofran. Diclegis sent to pt's pharmacy.

## 2016-12-01 ENCOUNTER — Ambulatory Visit (INDEPENDENT_AMBULATORY_CARE_PROVIDER_SITE_OTHER): Payer: Medicaid Other | Admitting: Obstetrics and Gynecology

## 2016-12-01 ENCOUNTER — Other Ambulatory Visit: Payer: Medicaid Other

## 2016-12-01 VITALS — BP 133/82 | HR 96 | Temp 98.1°F | Wt 218.6 lb

## 2016-12-01 DIAGNOSIS — Z23 Encounter for immunization: Secondary | ICD-10-CM | POA: Diagnosis not present

## 2016-12-01 DIAGNOSIS — Z3483 Encounter for supervision of other normal pregnancy, third trimester: Secondary | ICD-10-CM

## 2016-12-01 DIAGNOSIS — Z3480 Encounter for supervision of other normal pregnancy, unspecified trimester: Secondary | ICD-10-CM

## 2016-12-01 NOTE — Addendum Note (Signed)
Addended by: Natale MilchSTALLING, Adron Geisel D on: 12/01/2016 09:53 AM   Modules accepted: Orders

## 2016-12-01 NOTE — Progress Notes (Signed)
   PRENATAL VISIT NOTE  Subjective:  Alicia Rosario is a 36 y.o. [id number] at 549w1d being seen today for ongoing prenatal care.  She is currently monitored for the following issues for this low-risk pregnancy and has Carpal tunnel syndrome; IBS (irritable bowel syndrome); Neuropathy of right hand; MDD (major depressive disorder); Mood disorder (HCC); Bipolar II disorder (HCC); GAD (generalized anxiety disorder); Self-mutilation; Suicidal ideation; Supervision of other normal pregnancy, antepartum; and Nausea and vomiting during pregnancy on her problem list.  Patient reports persistent nausea slowly improving on diclegis.  Contractions: Irregular. Vag. Bleeding: None.  Movement: Present. Denies leaking of fluid.   The following portions of the patient's history were reviewed and updated as appropriate: allergies, current medications, past family history, past medical history, past social history, past surgical history and problem list. Problem list updated.  Objective:   Vitals:   12/01/16 0921  BP: 133/82  Pulse: 96  Temp: 98.1 F (36.7 C)  Weight: 218 lb 9.6 oz (99.2 kg)    Fetal Status: Fetal Heart Rate (bpm): 147 Fundal Height: 28 cm Movement: Present     General:  Alert, oriented and cooperative. Patient is in no acute distress.  Skin: Skin is warm and dry. No rash noted.   Cardiovascular: Normal heart rate noted  Respiratory: Normal respiratory effort, no problems with respiration noted  Abdomen: Soft, gravid, appropriate for gestational age. Pain/Pressure: Present     Pelvic:  Cervical exam deferred        Extremities: Normal range of motion.  Edema: None  Mental Status: Normal mood and affect. Normal behavior. Normal judgment and thought content.   Assessment and Plan:  Pregnancy: [id number] at 179w1d  1. Supervision of other normal pregnancy, antepartum Patient is doing well She declined 2 hr glucola as she is not able to come in fasting due to her nausea. She agrees to try  the 1 hr glucola test today Third trimester labs and tdap today Will obtain pap smear records from health department    Preterm labor symptoms and general obstetric precautions including but not limited to vaginal bleeding, contractions, leaking of fluid and fetal movement were reviewed in detail with the patient. Please refer to After Visit Summary for other counseling recommendations.  Return in about 2 weeks (around 12/15/2016).   Catalina AntiguaPeggy Mclane Arora, MD

## 2016-12-02 LAB — CBC
HEMATOCRIT: 35 % (ref 34.0–46.6)
HEMOGLOBIN: 11.9 g/dL (ref 11.1–15.9)
MCH: 25.1 pg — AB (ref 26.6–33.0)
MCHC: 34 g/dL (ref 31.5–35.7)
MCV: 74 fL — AB (ref 79–97)
PLATELETS: 209 10*3/uL (ref 150–379)
RBC: 4.75 x10E6/uL (ref 3.77–5.28)
RDW: 14.2 % (ref 12.3–15.4)
WBC: 8.5 10*3/uL (ref 3.4–10.8)

## 2016-12-02 LAB — HIV ANTIBODY (ROUTINE TESTING W REFLEX): HIV Screen 4th Generation wRfx: NONREACTIVE

## 2016-12-02 LAB — RPR: RPR Ser Ql: NONREACTIVE

## 2016-12-02 LAB — GLUCOSE TOLERANCE, 1 HOUR: Glucose, 1Hr PP: 89 mg/dL (ref 65–199)

## 2016-12-17 ENCOUNTER — Ambulatory Visit (INDEPENDENT_AMBULATORY_CARE_PROVIDER_SITE_OTHER): Payer: Medicaid Other | Admitting: Obstetrics & Gynecology

## 2016-12-17 DIAGNOSIS — Z3483 Encounter for supervision of other normal pregnancy, third trimester: Secondary | ICD-10-CM

## 2016-12-17 DIAGNOSIS — Z3480 Encounter for supervision of other normal pregnancy, unspecified trimester: Secondary | ICD-10-CM

## 2016-12-17 MED ORDER — PRENATAL MULTIVITAMIN CH: 1.0000 | ORAL_TABLET | Freq: Every day | ORAL | 2 refills | Status: DC

## 2016-12-17 MED ORDER — METOCLOPRAMIDE HCL 5 MG PO TABS: 5.0000 mg | ORAL_TABLET | Freq: Three times a day (TID) | ORAL | 2 refills | Status: DC

## 2016-12-17 MED ORDER — PANTOPRAZOLE SODIUM 40 MG PO TBEC: 40.0000 mg | DELAYED_RELEASE_TABLET | Freq: Every day | ORAL | 2 refills | Status: DC

## 2016-12-17 NOTE — Progress Notes (Signed)
Pt c/o N&V. Taking Diclegis as recommended with no relief. Phenergan and Zofran does not work either per pt.

## 2016-12-17 NOTE — Progress Notes (Signed)
   PRENATAL VISIT NOTE  Subjective:  Alicia Rosario is a 36 y.o. [id number] at 2265w3d being seen today for ongoing prenatal care.  She is currently monitored for the following issues for this low-risk pregnancy and has Carpal tunnel syndrome; IBS (irritable bowel syndrome); Neuropathy of right hand; MDD (major depressive disorder); Mood disorder (HCC); Bipolar II disorder (HCC); GAD (generalized anxiety disorder); Self-mutilation; Suicidal ideation; Supervision of other normal pregnancy, antepartum; and Nausea and vomiting during pregnancy on her problem list.  Patient reports heartburn, nausea and vomiting.  Contractions: Irregular. Vag. Bleeding: None.  Movement: Present. Denies leaking of fluid.   The following portions of the patient's history were reviewed and updated as appropriate: allergies, current medications, past family history, past medical history, past social history, past surgical history and problem list. Problem list updated.  Objective:   Vitals:   12/17/16 0839  BP: 117/78  Pulse: (!) 105  Weight: 212 lb (96.2 kg)    Fetal Status:     Movement: Present     General:  Alert, oriented and cooperative. Patient is in no acute distress.  Skin: Skin is warm and dry. No rash noted.   Cardiovascular: Normal heart rate noted  Respiratory: Normal respiratory effort, no problems with respiration noted  Abdomen: Soft, gravid, appropriate for gestational age. Pain/Pressure: Present     Pelvic:  Cervical exam deferred        Extremities: Normal range of motion.  Edema: None  Mental Status: Normal mood and affect. Normal behavior. Normal judgment and thought content.   Assessment and Plan:  Pregnancy: [id number] at 8865w3d  1. Supervision of other normal pregnancy, antepartum GERD - metoCLOPramide (REGLAN) 5 MG tablet; Take 1 tablet (5 mg total) by mouth 3 (three) times daily before meals.  Dispense: 90 tablet; Refill: 2 - pantoprazole (PROTONIX) 40 MG tablet; Take 1 tablet (40 mg  total) by mouth daily.  Dispense: 30 tablet; Refill: 2 - Prenatal Vit-Fe Fumarate-FA (PRENATAL MULTIVITAMIN) TABS tablet; Take 1 tablet by mouth daily.  Dispense: 30 tablet; Refill: 2  Preterm labor symptoms and general obstetric precautions including but not limited to vaginal bleeding, contractions, leaking of fluid and fetal movement were reviewed in detail with the patient. Please refer to After Visit Summary for other counseling recommendations.  Return in about 2 weeks (around 12/31/2016).   Adam PhenixJames G Nikodem Leadbetter, MD

## 2016-12-17 NOTE — Patient Instructions (Signed)
Heartburn During Pregnancy Heartburn is pain or discomfort in the throat or chest. It may cause a burning feeling. It happens when stomach acid moves up into the tube that carries food from your mouth to your stomach (esophagus). Heartburn is common during pregnancy. It usually goes away or gets better after giving birth. Follow these instructions at home: Eating and drinking  Do not drink alcohol while you are pregnant.  Figure out which foods and beverages make you feel worse, and avoid them.  Beverages that you may want to avoid include: ? Coffee and tea (with or without caffeine). ? Energy drinks and sports drinks. ? Bubbly (carbonated) drinks or sodas. ? Citrus fruit juices.  Foods that you may want to avoid include: ? Chocolate and cocoa. ? Peppermint and mint flavorings. ? Garlic, onions, and horseradish. ? Spicy and acidic foods. These include peppers, chili powder, curry powder, vinegar, hot sauces, and barbecue sauce. ? Citrus fruits, such as oranges, lemons, and limes. ? Tomato-based foods, such as red sauce, chili, and salsa. ? Fried and fatty foods, such as donuts, french fries, potato chips, and high-fat dressings. ? High-fat meats, such as hot dogs, cold cuts, sausage, ham, and bacon. ? High-fat dairy items, such as whole milk, butter, and cheese.  Eat small meals often, instead of large meals.  Avoid drinking a lot of liquid with your meals.  Avoid eating meals during the 2-3 hours before you go to bed.  Avoid lying down right after you eat.  Do not exercise right after you eat. Medicines  Take over-the-counter and prescription medicines only as told by your doctor.  Do not take aspirin, ibuprofen, or other NSAIDs unless your doctor tells you to do that.  Your doctor may tell you to avoid medicines that have sodium bicarbonate in them. General instructions  If told, raise the head of your bed about 6 inches (15 cm). You can do this by putting blocks under  the legs. Sleeping with more pillows does not help with heartburn.  Do not use any products that contain nicotine or tobacco, such as cigarettes and e-cigarettes. If you need help quitting, ask your doctor.  Wear loose-fitting clothing.  Try to lower your stress, such as with yoga or meditation. If you need help, ask your doctor.  Stay at a healthy weight. If you are overweight, work with your doctor to safely lose weight.  Keep all follow-up visits as told by your doctor. This is important. Contact a doctor if:  You get new symptoms.  Your symptoms do not get better with treatment.  You have weight loss and you do not know why.  You have trouble swallowing.  You make loud sounds when you breathe (wheeze).  You have a cough that does not go away.  You have heartburn often for more than 2 weeks.  You feel sick to your stomach (nauseous), and this does not get better with treatment.  You are throwing up (vomiting), and this does not get better with treatment.  You have pain in your belly (abdomen). Get help right away if:  You have very bad chest pain that spreads to your arm, neck, or jaw.  You feel sweaty, dizzy, or light-headed.  You have trouble breathing.  You have pain when swallowing.  You throw up and your throw-up looks like blood or coffee grounds.  Your poop (stool) is bloody or black. This information is not intended to replace advice given to you by your health care provider.   Make sure you discuss any questions you have with your health care provider. Document Released: 11/15/2010 Document Revised: 06/30/2016 Document Reviewed: 06/30/2016 Elsevier Interactive Patient Education  2017 [employer]  

## 2016-12-31 ENCOUNTER — Ambulatory Visit (INDEPENDENT_AMBULATORY_CARE_PROVIDER_SITE_OTHER): Payer: Medicaid Other | Admitting: Obstetrics & Gynecology

## 2016-12-31 ENCOUNTER — Encounter: Payer: Self-pay | Admitting: Obstetrics & Gynecology

## 2016-12-31 DIAGNOSIS — Z3483 Encounter for supervision of other normal pregnancy, third trimester: Secondary | ICD-10-CM

## 2016-12-31 DIAGNOSIS — Z3480 Encounter for supervision of other normal pregnancy, unspecified trimester: Secondary | ICD-10-CM

## 2016-12-31 NOTE — Progress Notes (Signed)
Pt states she is having some LE swelling and pelvic pressure.

## 2016-12-31 NOTE — Patient Instructions (Signed)
Third Trimester of Pregnancy The third trimester is from week 28 through week 40 (months 7 through 9). The third trimester is a time when the unborn baby (fetus) is growing rapidly. At the end of the ninth month, the fetus is about 20 inches in length and weighs 6-10 pounds. Body changes during your third trimester Your body will continue to go through many changes during pregnancy. The changes vary from woman to woman. During the third trimester:  Your weight will continue to increase. You can expect to gain 25-35 pounds (11-16 kg) by the end of the pregnancy.  You may begin to get stretch marks on your hips, abdomen, and breasts.  You may urinate more often because the fetus is moving lower into your pelvis and pressing on your bladder.  You may develop or continue to have heartburn. This is caused by increased hormones that slow down muscles in the digestive tract.  You may develop or continue to have constipation because increased hormones slow digestion and cause the muscles that push waste through your intestines to relax.  You may develop hemorrhoids. These are swollen veins (varicose veins) in the rectum that can itch or be painful.  You may develop swollen, bulging veins (varicose veins) in your legs.  You may have increased body aches in the pelvis, back, or thighs. This is due to weight gain and increased hormones that are relaxing your joints.  You may have changes in your hair. These can include thickening of your hair, rapid growth, and changes in texture. Some women also have hair loss during or after pregnancy, or hair that feels dry or thin. Your hair will most likely return to normal after your baby is born.  Your breasts will continue to grow and they will continue to become tender. A yellow fluid (colostrum) may leak from your breasts. This is the first milk you are producing for your baby.  Your belly button may stick out.  You may notice more swelling in your hands,  face, or ankles.  You may have increased tingling or numbness in your hands, arms, and legs. The skin on your belly may also feel numb.  You may feel short of breath because of your expanding uterus.  You may have more problems sleeping. This can be caused by the size of your belly, increased need to urinate, and an increase in your body's metabolism.  You may notice the fetus "dropping," or moving lower in your abdomen (lightening).  You may have increased vaginal discharge.  You may notice your joints feel loose and you may have pain around your pelvic bone.  What to expect at prenatal visits You will have prenatal exams every 2 weeks until week 36. Then you will have weekly prenatal exams. During a routine prenatal visit:  You will be weighed to make sure you and the baby are growing normally.  Your blood pressure will be taken.  Your abdomen will be measured to track your baby's growth.  The fetal heartbeat will be listened to.  Any test results from the previous visit will be discussed.  You may have a cervical check near your due date to see if your cervix has softened or thinned (effaced).  You will be tested for Group B streptococcus. This happens between 35 and 37 weeks.  Your health care provider may ask you:  What your birth plan is.  How you are feeling.  If you are feeling the baby move.  If you have had   any abnormal symptoms, such as leaking fluid, bleeding, severe headaches, or abdominal cramping.  If you are using any tobacco products, including cigarettes, chewing tobacco, and electronic cigarettes.  If you have any questions.  Other tests or screenings that may be performed during your third trimester include:  Blood tests that check for low iron levels (anemia).  Fetal testing to check the health, activity level, and growth of the fetus. Testing is done if you have certain medical conditions or if there are problems during the  pregnancy.  Nonstress test (NST). This test checks the health of your baby to make sure there are no signs of problems, such as the baby not getting enough oxygen. During this test, a belt is placed around your belly. The baby is made to move, and its heart rate is monitored during movement.  What is false labor? False labor is a condition in which you feel small, irregular tightenings of the muscles in the womb (contractions) that usually go away with rest, changing position, or drinking water. These are called Braxton Hicks contractions. Contractions may last for hours, days, or even weeks before true labor sets in. If contractions come at regular intervals, become more frequent, increase in intensity, or become painful, you should see your health care provider. What are the signs of labor?  Abdominal cramps.  Regular contractions that start at 10 minutes apart and become stronger and more frequent with time.  Contractions that start on the top of the uterus and spread down to the lower abdomen and back.  Increased pelvic pressure and dull back pain.  A watery or bloody mucus discharge that comes from the vagina.  Leaking of amniotic fluid. This is also known as your "water breaking." It could be a slow trickle or a gush. Let your health care provider know if it has a color or strange odor. If you have any of these signs, call your health care provider right away, even if it is before your due date. Follow these instructions at home: Medicines  Follow your health care provider's instructions regarding medicine use. Specific medicines may be either safe or unsafe to take during pregnancy.  Take a prenatal vitamin that contains at least 600 micrograms (mcg) of folic acid.  If you develop constipation, try taking a stool softener if your health care provider approves. Eating and drinking  Eat a balanced diet that includes fresh fruits and vegetables, whole grains, good sources of protein  such as meat, eggs, or tofu, and low-fat dairy. Your health care provider will help you determine the amount of weight gain that is right for you.  Avoid raw meat and uncooked cheese. These carry germs that can cause birth defects in the baby.  If you have low calcium intake from food, talk to your health care provider about whether you should take a daily calcium supplement.  Eat four or five small meals rather than three large meals a day.  Limit foods that are high in fat and processed sugars, such as fried and sweet foods.  To prevent constipation: ? Drink enough fluid to keep your urine clear or pale yellow. ? Eat foods that are high in fiber, such as fresh fruits and vegetables, whole grains, and beans. Activity  Exercise only as directed by your health care provider. Most women can continue their usual exercise routine during pregnancy. Try to exercise for 30 minutes at least 5 days a week. Stop exercising if you experience uterine contractions.  Avoid heavy   lifting.  Do not exercise in extreme heat or humidity, or at high altitudes.  Wear low-heel, comfortable shoes.  Practice good posture.  You may continue to have sex unless your health care provider tells you otherwise. Relieving pain and discomfort  Take frequent breaks and rest with your legs elevated if you have leg cramps or low back pain.  Take warm sitz baths to soothe any pain or discomfort caused by hemorrhoids. Use hemorrhoid cream if your health care provider approves.  Wear a good support bra to prevent discomfort from breast tenderness.  If you develop varicose veins: ? Wear support pantyhose or compression stockings as told by your healthcare provider. ? Elevate your feet for 15 minutes, 3-4 times a day. Prenatal care  Write down your questions. Take them to your prenatal visits.  Keep all your prenatal visits as told by your health care provider. This is important. Safety  Wear your seat belt at  all times when driving.  Make a list of emergency phone numbers, including numbers for family, friends, the hospital, and police and fire departments. General instructions  Avoid cat litter boxes and soil used by cats. These carry germs that can cause birth defects in the baby. If you have a cat, ask someone to clean the litter box for you.  Do not travel far distances unless it is absolutely necessary and only with the approval of your health care provider.  Do not use hot tubs, steam rooms, or saunas.  Do not drink alcohol.  Do not use any products that contain nicotine or tobacco, such as cigarettes and e-cigarettes. If you need help quitting, ask your health care provider.  Do not use any medicinal herbs or unprescribed drugs. These chemicals affect the formation and growth of the baby.  Do not douche or use tampons or scented sanitary pads.  Do not cross your legs for long periods of time.  To prepare for the arrival of your baby: ? Take prenatal classes to understand, practice, and ask questions about labor and delivery. ? Make a trial run to the hospital. ? Visit the hospital and tour the maternity area. ? Arrange for maternity or paternity leave through employers. ? Arrange for family and friends to take care of pets while you are in the hospital. ? Purchase a rear-facing car seat and make sure you know how to install it in your car. ? Pack your hospital bag. ? Prepare the baby's nursery. Make sure to remove all pillows and stuffed animals from the baby's crib to prevent suffocation.  Visit your dentist if you have not gone during your pregnancy. Use a soft toothbrush to brush your teeth and be gentle when you floss. Contact a health care provider if:  You are unsure if you are in labor or if your water has broken.  You become dizzy.  You have mild pelvic cramps, pelvic pressure, or nagging pain in your abdominal area.  You have lower back pain.  You have persistent  nausea, vomiting, or diarrhea.  You have an unusual or bad smelling vaginal discharge.  You have pain when you urinate. Get help right away if:  Your water breaks before 37 weeks.  You have regular contractions less than 5 minutes apart before 37 weeks.  You have a fever.  You are leaking fluid from your vagina.  You have spotting or bleeding from your vagina.  You have severe abdominal pain or cramping.  You have rapid weight loss or weight gain.    You have shortness of breath with chest pain.  You notice sudden or extreme swelling of your face, hands, ankles, feet, or legs.  Your baby makes fewer than 10 movements in 2 hours.  You have severe headaches that do not go away when you take medicine.  You have vision changes. Summary  The third trimester is from week 28 through week 40, months 7 through 9. The third trimester is a time when the unborn baby (fetus) is growing rapidly.  During the third trimester, your discomfort may increase as you and your baby continue to gain weight. You may have abdominal, leg, and back pain, sleeping problems, and an increased need to urinate.  During the third trimester your breasts will keep growing and they will continue to become tender. A yellow fluid (colostrum) may leak from your breasts. This is the first milk you are producing for your baby.  False labor is a condition in which you feel small, irregular tightenings of the muscles in the womb (contractions) that eventually go away. These are called Braxton Hicks contractions. Contractions may last for hours, days, or even weeks before true labor sets in.  Signs of labor can include: abdominal cramps; regular contractions that start at 10 minutes apart and become stronger and more frequent with time; watery or bloody mucus discharge that comes from the vagina; increased pelvic pressure and dull back pain; and leaking of amniotic fluid. This information is not intended to replace advice  given to you by your health care provider. Make sure you discuss any questions you have with your health care provider. Document Released: 10/07/2001 Document Revised: 03/20/2016 Document Reviewed: 12/14/2012 Elsevier Interactive Patient Education  2017 Else[employer] Inc.  

## 2016-12-31 NOTE — Progress Notes (Signed)
   PRENATAL VISIT NOTE  Subjective:  Alicia Rosario is a 36 y.o. [id number] at 6250w3d being seen today for ongoing prenatal care.  She is currently monitored for the following issues for this low-risk pregnancy and has Carpal tunnel syndrome; IBS (irritable bowel syndrome); Neuropathy of right hand; MDD (major depressive disorder); Mood disorder (HCC); Bipolar II disorder (HCC); GAD (generalized anxiety disorder); Self-mutilation; Suicidal ideation; Supervision of other normal pregnancy, antepartum; and Nausea and vomiting during pregnancy on her problem list.  Patient reports ankle swelling at work.  Contractions: Irregular. Vag. Bleeding: None.  Movement: Present. Denies leaking of fluid.   The following portions of the patient's history were reviewed and updated as appropriate: allergies, current medications, past family history, past medical history, past social history, past surgical history and problem list. Problem list updated.  Objective:   Vitals:   12/31/16 0846  BP: 121/81  Pulse: 89  Weight: 217 lb (98.4 kg)    Fetal Status: Fetal Heart Rate (bpm): 135   Movement: Present     General:  Alert, oriented and cooperative. Patient is in no acute distress.  Skin: Skin is warm and dry. No rash noted.   Cardiovascular: Normal heart rate noted  Respiratory: Normal respiratory effort, no problems with respiration noted  Abdomen: Soft, gravid, appropriate for gestational age. Pain/Pressure: Present     Pelvic:  Cervical exam deferred        Extremities: Normal range of motion.  Edema: Trace  Mental Status: Normal mood and affect. Normal behavior. Normal judgment and thought content.   Assessment and Plan:  Pregnancy: [id number] at 5250w3d  1. Supervision of other normal pregnancy, antepartum No preeclampsia  Preterm labor symptoms and general obstetric precautions including but not limited to vaginal bleeding, contractions, leaking of fluid and fetal movement were reviewed in detail  with the patient. Please refer to After Visit Summary for other counseling recommendations.  Return in about 2 weeks (around 01/14/2017).   Adam PhenixJames G Juanjose Mojica, MD

## 2017-01-05 DIAGNOSIS — Z3483 Encounter for supervision of other normal pregnancy, third trimester: Secondary | ICD-10-CM

## 2017-01-08 ENCOUNTER — Encounter (HOSPITAL_COMMUNITY): Payer: Self-pay | Admitting: *Deleted

## 2017-01-08 ENCOUNTER — Telehealth: Payer: Self-pay | Admitting: *Deleted

## 2017-01-08 ENCOUNTER — Inpatient Hospital Stay (HOSPITAL_COMMUNITY)
Admission: AD | Admit: 2017-01-08 | Discharge: 2017-01-08 | Disposition: A | Discharge status: Home / Self Care | Payer: Medicaid Other | Source: Ambulatory Visit | Attending: Obstetrics and Gynecology | Admitting: Obstetrics and Gynecology

## 2017-01-08 DIAGNOSIS — O26893 Other specified pregnancy related conditions, third trimester: Secondary | ICD-10-CM

## 2017-01-08 DIAGNOSIS — N949 Unspecified condition associated with female genital organs and menstrual cycle: Secondary | ICD-10-CM | POA: Diagnosis not present

## 2017-01-08 DIAGNOSIS — Z3A33 33 weeks gestation of pregnancy: Secondary | ICD-10-CM | POA: Insufficient documentation

## 2017-01-08 DIAGNOSIS — Z3480 Encounter for supervision of other normal pregnancy, unspecified trimester: Secondary | ICD-10-CM

## 2017-01-08 DIAGNOSIS — Z87891 Personal history of nicotine dependence: Secondary | ICD-10-CM | POA: Insufficient documentation

## 2017-01-08 DIAGNOSIS — R102 Pelvic and perineal pain: Secondary | ICD-10-CM

## 2017-01-08 LAB — URINALYSIS, ROUTINE W REFLEX MICROSCOPIC
BILIRUBIN URINE: NEGATIVE
Glucose, UA: NEGATIVE mg/dL
HGB URINE DIPSTICK: NEGATIVE
Ketones, ur: NEGATIVE mg/dL
Nitrite: NEGATIVE
PROTEIN: NEGATIVE mg/dL
Specific Gravity, Urine: 1.009 (ref 1.005–1.030)
pH: 7 (ref 5.0–8.0)

## 2017-01-08 LAB — WET PREP, GENITAL
CLUE CELLS WET PREP: NONE SEEN
Sperm: NONE SEEN
Trich, Wet Prep: NONE SEEN
YEAST WET PREP: NONE SEEN

## 2017-01-08 LAB — OB RESULTS CONSOLE GC/CHLAMYDIA: Gonorrhea: NEGATIVE

## 2017-01-08 LAB — FETAL FIBRONECTIN: Fetal Fibronectin: NEGATIVE

## 2017-01-08 NOTE — MAU Provider Note (Signed)
History     CSN: [id number]  Arrival date and time: 01/08/17 1040   First Provider Initiated Contact with Patient 01/08/17 1115      Chief Complaint  Patient presents with  . Contractions   HPI  Alicia Rosario is a 36 y.o. [id number] at [redacted]w[redacted]d who presents to MAU for contractions. She has been having intermittent contractions for the past couple of days and states that at 0100 she started feeling abdominal pain, constant, associated with pelvic pressure. She denies vaginal bleeding or fluid leakage. She feels good fetal movement.   Denies headache, vision changes, N/V, fevers, SOB, endorses leg swelling for the past couple of weeks. Denies comorbidities (mood disorder identified upon chart review).  OB History    Gravida Para Term Preterm AB Living   5 2 2   2 2    SAB TAB Ectopic Multiple Live Births   2       2      Past Medical History:  Diagnosis Date  . Bipolar II disorder (HCC)   . Depression   . Hypertension   . Irritable bowel syndrome (IBS)     Past Surgical History:  Procedure Laterality Date  . CHOLECYSTECTOMY    . MOUTH SURGERY      Family History  Problem Relation Age of Onset  . Mental illness Mother   . Hyperlipidemia Father   . Cancer Paternal Grandmother     Breast CA    Social History  Substance Use Topics  . Smoking status: Former Games developer  . Smokeless tobacco: Never Used  . Alcohol use No     Comment: social    Allergies: No Known Allergies  Prescriptions Prior to Admission  Medication Sig Dispense Refill Last Dose  . acetaminophen (TYLENOL) 500 MG tablet Take 1,000 mg by mouth every 6 (six) hours as needed for mild pain, moderate pain or headache.    Taking  . docusate sodium (COLACE) 100 MG capsule Take 1 capsule (100 mg total) by mouth 2 (two) times daily as needed. 30 capsule 2 Taking  . metoCLOPramide (REGLAN) 5 MG tablet Take 1 tablet (5 mg total) by mouth 3 (three) times daily before meals. 90 tablet 2 Taking  . pantoprazole  (PROTONIX) 40 MG tablet Take 1 tablet (40 mg total) by mouth daily. 30 tablet 2 Taking  . Prenatal Vit-Fe Fumarate-FA (PRENATAL MULTIVITAMIN) TABS tablet Take 1 tablet by mouth daily. 30 tablet 2 Taking    Review of Systems  Constitutional: Negative for fatigue and fever.  HENT: Negative for rhinorrhea.   Eyes: Negative for visual disturbance.  Respiratory: Negative for apnea, cough and shortness of breath.   Cardiovascular: Negative for chest pain, palpitations and leg swelling.  Gastrointestinal: Negative for abdominal pain, blood in stool, diarrhea, nausea and vomiting.  Genitourinary: Negative for dysuria, hematuria, pelvic pain and vaginal bleeding.  Musculoskeletal: Negative for myalgias.  Skin: Negative for pallor and rash.  Neurological: Negative for dizziness and headaches.   Physical Exam   Blood pressure 129/79, pulse 100, temperature 98.8 F (37.1 C), temperature source Oral, resp. rate 18, height 5\' 4"  (1.626 m), weight 217 lb (98.4 kg), last menstrual period 05/18/2016.  Physical Exam  Constitutional: She is oriented to person, place, and time. She appears well-developed and well-nourished.  HENT:  Head: Normocephalic and atraumatic.  Eyes: Conjunctivae and EOM are normal. Pupils are equal, round, and reactive to light.  Cardiovascular: Normal rate, regular rhythm, normal heart sounds and intact distal pulses.  Exam reveals no gallop and no friction rub.   No murmur heard. Respiratory: Effort normal and breath sounds normal. No respiratory distress. She has no wheezes.  GI: Soft. She exhibits no distension. There is no tenderness. There is no rebound and no guarding.  Neurological: She is alert and oriented to person, place, and time.  Skin: Skin is warm and dry. No rash noted. No pallor.  Sterile specular exam: vaginal discharge, white. Cervix appears to be closed. No pooling of fluid, no bleeding SVE: fingertip, thick, posterior  MAU Course  Procedures Specular  exam, sterile Sterile vaginal exam Fetal monitoring Wet prep (neg), U/A (normal), GC/Chlamydia (pending), FFN (negative)  Assessment and Plan  Alicia Rosario is a 36 y.o. [id number] at 9445w4d who presents to MAU for contractions. Fetal monitoring showed Cat I tracing, no contractions, negative FFN and cervical exam: fingertip/thick/posterior. No concerns for preterm labor at this point. Patient was given recommendations and instructions of alarming signs for preterm labor and d/c home  Alicia Rosario 01/08/2017, 11:15 AM   OB FELLOW MAU DISCHARGE ATTESTATION  I have seen and examined this patient; I agree with above documentation in the resident's note. I personally checked the patient, found SVE to be  Dilation: Fingertip Effacement (%): Thick Cervical Position: Posterior Station:  (high) Exam by:: Cartha Rotert,DO FFN was negative. No infections noted.    I personally reviewed the patient's NST today, found to be REACTIVE. 130 bpm, mod var, +accels, no decels. CTX: NONE.  Patient not found to be in preterm labor. OK to d/c home.  Jen MowElizabeth Mackinley Kiehn, DO [facility] 01/08/2017

## 2017-01-08 NOTE — MAU Note (Signed)
Pt reports she has been having pelvic pressure and pain off and all night. Thinks her mucus plug came out. Pelvic pressure is more s=constant now. Good fetal movement reported.

## 2017-01-08 NOTE — Discharge Instructions (Signed)
Preterm Labor and Birth Information °Pregnancy normally lasts 39-41 weeks. Preterm labor is when labor starts early. It starts before you have been pregnant for 37 whole weeks. °What are the risk factors for preterm labor? °Preterm labor is more likely to occur in women who: °· Have an infection while pregnant. °· Have a cervix that is short. °· Have gone into preterm labor before. °· Have had surgery on their cervix. °· Are younger than age 34. °· Are older than age 35. °· Are African American. °· Are pregnant with two or more babies. °· Take street drugs while pregnant. °· Smoke while pregnant. °· Do not gain enough weight while pregnant. °· Got pregnant right after another pregnancy. °What are the symptoms of preterm labor? °Symptoms of preterm labor include: °· Cramps. The cramps may feel like the cramps some women get during their period. The cramps may happen with watery poop (diarrhea). °· Pain in the belly (abdomen). °· Pain in the lower back. °· Regular contractions or tightening. It may feel like your belly is getting tighter. °· Pressure in the lower belly that seems to get stronger. °· More fluid (discharge) leaking from the vagina. The fluid may be watery or bloody. °· Water breaking. °Why is it important to notice signs of preterm labor? °Babies who are born early may not be fully developed. They have a higher chance for: °· Long-term heart problems. °· Long-term lung problems. °· Trouble controlling body systems, like breathing. °· Bleeding in the brain. °· A condition called cerebral palsy. °· Learning difficulties. °· Death. °These risks are highest for babies who are born before 34 weeks of pregnancy. °How is preterm labor treated? °Treatment depends on: °· How long you were pregnant. °· Your condition. °· The health of your baby. °Treatment may involve: °· Having a stitch (suture) placed in your cervix. When you give birth, your cervix opens so the baby can come out. The stitch keeps the cervix  from opening too soon. °· Staying at the hospital. °· Taking or getting medicines, such as: °¨ Hormone medicines. °¨ Medicines to stop contractions. °¨ Medicines to help the baby’s lungs develop. °¨ Medicines to prevent your baby from having cerebral palsy. °What should I do if I am in preterm labor? °If you think you are going into labor too soon, call your doctor right away. °How can I prevent preterm labor? °· Do not use any tobacco products. °¨ Examples of these are cigarettes, chewing tobacco, and e-cigarettes. °¨ If you need help quitting, ask your doctor. °· Do not use street drugs. °· Do not use any medicines unless you ask your doctor if they are safe for you. °· Talk with your doctor before taking any herbal supplements. °· Make sure you gain enough weight. °· Watch for infection. If you think you might have an infection, get it checked right away. °· If you have gone into preterm labor before, tell your doctor. °This information is not intended to replace advice given to you by your health care provider. Make sure you discuss any questions you have with your health care provider. °Document Released: 01/09/2009 Document Revised: 03/25/2016 Document Reviewed: 03/05/2016 °Elsevier Interactive Patient Education © 2017 [employer] Inc. ° °

## 2017-01-08 NOTE — Telephone Encounter (Signed)
Patient thinks she is in active labor and is going to the hospital.

## 2017-01-10 LAB — GC/CHLAMYDIA PROBE AMP (~~LOC~~) NOT AT ARMC
Chlamydia: NEGATIVE
Neisseria Gonorrhea: NEGATIVE

## 2017-01-14 ENCOUNTER — Ambulatory Visit (INDEPENDENT_AMBULATORY_CARE_PROVIDER_SITE_OTHER): Payer: Medicaid Other | Admitting: Obstetrics and Gynecology

## 2017-01-14 ENCOUNTER — Encounter: Payer: Self-pay | Admitting: Obstetrics

## 2017-01-14 VITALS — BP 121/85 | HR 96 | Wt 219.0 lb

## 2017-01-14 DIAGNOSIS — Z3483 Encounter for supervision of other normal pregnancy, third trimester: Secondary | ICD-10-CM

## 2017-01-14 DIAGNOSIS — Z3480 Encounter for supervision of other normal pregnancy, unspecified trimester: Secondary | ICD-10-CM

## 2017-01-14 DIAGNOSIS — D582 Other hemoglobinopathies: Secondary | ICD-10-CM

## 2017-01-14 NOTE — Progress Notes (Signed)
Pt states some LE swelling.

## 2017-01-14 NOTE — Progress Notes (Signed)
Subjective:  Alicia Rosario is a 36 y.o. [id number] at 318w3d being seen today for ongoing prenatal care.  She is currently monitored for the following issues for this low-risk pregnancy and has Carpal tunnel syndrome; IBS (irritable bowel syndrome); Neuropathy of right hand; MDD (major depressive disorder); Mood disorder (HCC); Bipolar II disorder (HCC); GAD (generalized anxiety disorder); Self-mutilation; Supervision of other normal pregnancy, antepartum; Nausea and vomiting during pregnancy; and Hemoglobin C (Hb-C) (HCC) on her problem list.  Patient reports no complaints.  Contractions: Irregular. Vag. Bleeding: None.  Movement: Present. Denies leaking of fluid.   The following portions of the patient's history were reviewed and updated as appropriate: allergies, current medications, past family history, past medical history, past social history, past surgical history and problem list. Problem list updated.  Objective:   Vitals:   01/14/17 0854  BP: 121/85  Pulse: 96  Weight: 219 lb (99.3 kg)    Fetal Status: Fetal Heart Rate (bpm): 140   Movement: Present     General:  Alert, oriented and cooperative. Patient is in no acute distress.  Skin: Skin is warm and dry. No rash noted.   Cardiovascular: Normal heart rate noted  Respiratory: Normal respiratory effort, no problems with respiration noted  Abdomen: Soft, gravid, appropriate for gestational age. Pain/Pressure: Present     Pelvic:  Cervical exam deferred        Extremities: Normal range of motion.  Edema: Trace  Mental Status: Normal mood and affect. Normal behavior. Normal judgment and thought content.   Urinalysis:      Assessment and Plan:  Pregnancy: A5W0981G5P2022 at 8818w3d  1. Supervision of other normal pregnancy, antepartum Stable  2. Hemoglobin C (Hb-C) (HCC) Has seen genetics No impact on current pregnancy  Preterm labor symptoms and general obstetric precautions including but not limited to vaginal bleeding,  contractions, leaking of fluid and fetal movement were reviewed in detail with the patient. Please refer to After Visit Summary for other counseling recommendations.  Return in about 2 weeks (around 01/28/2017).   Hermina StaggersMichael L Jaquane Boughner, MD

## 2017-01-14 NOTE — Patient Instructions (Signed)
Third Trimester of Pregnancy The third trimester is from week 28 through week 40 (months 7 through 9). The third trimester is a time when the unborn baby (fetus) is growing rapidly. At the end of the ninth month, the fetus is about 20 inches in length and weighs 6-10 pounds. Body changes during your third trimester Your body will continue to go through many changes during pregnancy. The changes vary from woman to woman. During the third trimester:  Your weight will continue to increase. You can expect to gain 25-35 pounds (11-16 kg) by the end of the pregnancy.  You may begin to get stretch marks on your hips, abdomen, and breasts.  You may urinate more often because the fetus is moving lower into your pelvis and pressing on your bladder.  You may develop or continue to have heartburn. This is caused by increased hormones that slow down muscles in the digestive tract.  You may develop or continue to have constipation because increased hormones slow digestion and cause the muscles that push waste through your intestines to relax.  You may develop hemorrhoids. These are swollen veins (varicose veins) in the rectum that can itch or be painful.  You may develop swollen, bulging veins (varicose veins) in your legs.  You may have increased body aches in the pelvis, back, or thighs. This is due to weight gain and increased hormones that are relaxing your joints.  You may have changes in your hair. These can include thickening of your hair, rapid growth, and changes in texture. Some women also have hair loss during or after pregnancy, or hair that feels dry or thin. Your hair will most likely return to normal after your baby is born.  Your breasts will continue to grow and they will continue to become tender. A yellow fluid (colostrum) may leak from your breasts. This is the first milk you are producing for your baby.  Your belly button may stick out.  You may notice more swelling in your hands,  face, or ankles.  You may have increased tingling or numbness in your hands, arms, and legs. The skin on your belly may also feel numb.  You may feel short of breath because of your expanding uterus.  You may have more problems sleeping. This can be caused by the size of your belly, increased need to urinate, and an increase in your body's metabolism.  You may notice the fetus "dropping," or moving lower in your abdomen (lightening).  You may have increased vaginal discharge.  You may notice your joints feel loose and you may have pain around your pelvic bone.  What to expect at prenatal visits You will have prenatal exams every 2 weeks until week 36. Then you will have weekly prenatal exams. During a routine prenatal visit:  You will be weighed to make sure you and the baby are growing normally.  Your blood pressure will be taken.  Your abdomen will be measured to track your baby's growth.  The fetal heartbeat will be listened to.  Any test results from the previous visit will be discussed.  You may have a cervical check near your due date to see if your cervix has softened or thinned (effaced).  You will be tested for Group B streptococcus. This happens between 35 and 37 weeks.  Your health care provider may ask you:  What your birth plan is.  How you are feeling.  If you are feeling the baby move.  If you have had   any abnormal symptoms, such as leaking fluid, bleeding, severe headaches, or abdominal cramping.  If you are using any tobacco products, including cigarettes, chewing tobacco, and electronic cigarettes.  If you have any questions.  Other tests or screenings that may be performed during your third trimester include:  Blood tests that check for low iron levels (anemia).  Fetal testing to check the health, activity level, and growth of the fetus. Testing is done if you have certain medical conditions or if there are problems during the  pregnancy.  Nonstress test (NST). This test checks the health of your baby to make sure there are no signs of problems, such as the baby not getting enough oxygen. During this test, a belt is placed around your belly. The baby is made to move, and its heart rate is monitored during movement.  What is false labor? False labor is a condition in which you feel small, irregular tightenings of the muscles in the womb (contractions) that usually go away with rest, changing position, or drinking water. These are called Braxton Hicks contractions. Contractions may last for hours, days, or even weeks before true labor sets in. If contractions come at regular intervals, become more frequent, increase in intensity, or become painful, you should see your health care provider. What are the signs of labor?  Abdominal cramps.  Regular contractions that start at 10 minutes apart and become stronger and more frequent with time.  Contractions that start on the top of the uterus and spread down to the lower abdomen and back.  Increased pelvic pressure and dull back pain.  A watery or bloody mucus discharge that comes from the vagina.  Leaking of amniotic fluid. This is also known as your "water breaking." It could be a slow trickle or a gush. Let your health care provider know if it has a color or strange odor. If you have any of these signs, call your health care provider right away, even if it is before your due date. Follow these instructions at home: Medicines  Follow your health care provider's instructions regarding medicine use. Specific medicines may be either safe or unsafe to take during pregnancy.  Take a prenatal vitamin that contains at least 600 micrograms (mcg) of folic acid.  If you develop constipation, try taking a stool softener if your health care provider approves. Eating and drinking  Eat a balanced diet that includes fresh fruits and vegetables, whole grains, good sources of protein  such as meat, eggs, or tofu, and low-fat dairy. Your health care provider will help you determine the amount of weight gain that is right for you.  Avoid raw meat and uncooked cheese. These carry germs that can cause birth defects in the baby.  If you have low calcium intake from food, talk to your health care provider about whether you should take a daily calcium supplement.  Eat four or five small meals rather than three large meals a day.  Limit foods that are high in fat and processed sugars, such as fried and sweet foods.  To prevent constipation: ? Drink enough fluid to keep your urine clear or pale yellow. ? Eat foods that are high in fiber, such as fresh fruits and vegetables, whole grains, and beans. Activity  Exercise only as directed by your health care provider. Most women can continue their usual exercise routine during pregnancy. Try to exercise for 30 minutes at least 5 days a week. Stop exercising if you experience uterine contractions.  Avoid heavy   lifting.  Do not exercise in extreme heat or humidity, or at high altitudes.  Wear low-heel, comfortable shoes.  Practice good posture.  You may continue to have sex unless your health care provider tells you otherwise. Relieving pain and discomfort  Take frequent breaks and rest with your legs elevated if you have leg cramps or low back pain.  Take warm sitz baths to soothe any pain or discomfort caused by hemorrhoids. Use hemorrhoid cream if your health care provider approves.  Wear a good support bra to prevent discomfort from breast tenderness.  If you develop varicose veins: ? Wear support pantyhose or compression stockings as told by your healthcare provider. ? Elevate your feet for 15 minutes, 3-4 times a day. Prenatal care  Write down your questions. Take them to your prenatal visits.  Keep all your prenatal visits as told by your health care provider. This is important. Safety  Wear your seat belt at  all times when driving.  Make a list of emergency phone numbers, including numbers for family, friends, the hospital, and police and fire departments. General instructions  Avoid cat litter boxes and soil used by cats. These carry germs that can cause birth defects in the baby. If you have a cat, ask someone to clean the litter box for you.  Do not travel far distances unless it is absolutely necessary and only with the approval of your health care provider.  Do not use hot tubs, steam rooms, or saunas.  Do not drink alcohol.  Do not use any products that contain nicotine or tobacco, such as cigarettes and e-cigarettes. If you need help quitting, ask your health care provider.  Do not use any medicinal herbs or unprescribed drugs. These chemicals affect the formation and growth of the baby.  Do not douche or use tampons or scented sanitary pads.  Do not cross your legs for long periods of time.  To prepare for the arrival of your baby: ? Take prenatal classes to understand, practice, and ask questions about labor and delivery. ? Make a trial run to the hospital. ? Visit the hospital and tour the maternity area. ? Arrange for maternity or paternity leave through employers. ? Arrange for family and friends to take care of pets while you are in the hospital. ? Purchase a rear-facing car seat and make sure you know how to install it in your car. ? Pack your hospital bag. ? Prepare the baby's nursery. Make sure to remove all pillows and stuffed animals from the baby's crib to prevent suffocation.  Visit your dentist if you have not gone during your pregnancy. Use a soft toothbrush to brush your teeth and be gentle when you floss. Contact a health care provider if:  You are unsure if you are in labor or if your water has broken.  You become dizzy.  You have mild pelvic cramps, pelvic pressure, or nagging pain in your abdominal area.  You have lower back pain.  You have persistent  nausea, vomiting, or diarrhea.  You have an unusual or bad smelling vaginal discharge.  You have pain when you urinate. Get help right away if:  Your water breaks before 37 weeks.  You have regular contractions less than 5 minutes apart before 37 weeks.  You have a fever.  You are leaking fluid from your vagina.  You have spotting or bleeding from your vagina.  You have severe abdominal pain or cramping.  You have rapid weight loss or weight gain.    You have shortness of breath with chest pain.  You notice sudden or extreme swelling of your face, hands, ankles, feet, or legs.  Your baby makes fewer than 10 movements in 2 hours.  You have severe headaches that do not go away when you take medicine.  You have vision changes. Summary  The third trimester is from week 28 through week 40, months 7 through 9. The third trimester is a time when the unborn baby (fetus) is growing rapidly.  During the third trimester, your discomfort may increase as you and your baby continue to gain weight. You may have abdominal, leg, and back pain, sleeping problems, and an increased need to urinate.  During the third trimester your breasts will keep growing and they will continue to become tender. A yellow fluid (colostrum) may leak from your breasts. This is the first milk you are producing for your baby.  False labor is a condition in which you feel small, irregular tightenings of the muscles in the womb (contractions) that eventually go away. These are called Braxton Hicks contractions. Contractions may last for hours, days, or even weeks before true labor sets in.  Signs of labor can include: abdominal cramps; regular contractions that start at 10 minutes apart and become stronger and more frequent with time; watery or bloody mucus discharge that comes from the vagina; increased pelvic pressure and dull back pain; and leaking of amniotic fluid. This information is not intended to replace advice  given to you by your health care provider. Make sure you discuss any questions you have with your health care provider. Document Released: 10/07/2001 Document Revised: 03/20/2016 Document Reviewed: 12/14/2012 Elsevier Interactive Patient Education  2017 Else[employer] Inc.  

## 2017-01-28 ENCOUNTER — Ambulatory Visit (INDEPENDENT_AMBULATORY_CARE_PROVIDER_SITE_OTHER): Payer: Medicaid Other | Admitting: Obstetrics & Gynecology

## 2017-01-28 ENCOUNTER — Encounter: Payer: Self-pay | Admitting: *Deleted

## 2017-01-28 DIAGNOSIS — Z3483 Encounter for supervision of other normal pregnancy, third trimester: Secondary | ICD-10-CM

## 2017-01-28 DIAGNOSIS — Z3480 Encounter for supervision of other normal pregnancy, unspecified trimester: Secondary | ICD-10-CM

## 2017-01-28 NOTE — Patient Instructions (Signed)

## 2017-01-28 NOTE — Progress Notes (Signed)
Patient reports good fetal movement, complains of pelvic pain and irregular contractions.

## 2017-01-28 NOTE — Progress Notes (Signed)
   PRENATAL VISIT NOTE  Subjective:  Alicia Rosario is a 36 y.o. [phone] at 106w3d being seen today for ongoing prenatal care.  She is currently monitored for the following issues for this low-risk pregnancy and has Carpal tunnel syndrome; IBS (irritable bowel syndrome); Neuropathy of right hand; MDD (major depressive disorder); Mood disorder (HCC); Bipolar II disorder (HCC); GAD (generalized anxiety disorder); Self-mutilation; Supervision of other normal pregnancy, antepartum; Nausea and vomiting during pregnancy; and Hemoglobin C (Hb-C) (HCC) on her problem list.  Patient reports IBS sx with frequent BM.  Contractions: Irregular. Vag. Bleeding: None.  Movement: Present. Denies leaking of fluid.   The following portions of the patient's history were reviewed and updated as appropriate: allergies, current medications, past family history, past medical history, past social history, past surgical history and problem list. Problem list updated.  Objective:   Vitals:   01/28/17 1328  BP: 128/83  Pulse: 97  Weight: 224 lb 12.8 oz (102 kg)    Fetal Status: Fetal Heart Rate (bpm): 141 Fundal Height: 36 cm Movement: Present     General:  Alert, oriented and cooperative. Patient is in no acute distress.  Skin: Skin is warm and dry. No rash noted.   Cardiovascular: Normal heart rate noted  Respiratory: Normal respiratory effort, no problems with respiration noted  Abdomen: Soft, gravid, appropriate for gestational age. Pain/Pressure: Present     Pelvic:  Cervical exam performed        Extremities: Normal range of motion.  Edema: Trace  Mental Status: Normal mood and affect. Normal behavior. Normal judgment and thought content.   Assessment and Plan:  Pregnancy: [id number] at [redacted]w[redacted]d  1. Supervision of other normal pregnancy, antepartum H/O 36.6 week delivery  Preterm labor symptoms and general obstetric precautions including but not limited to vaginal bleeding, contractions, leaking of fluid  and fetal movement were reviewed in detail with the patient. Please refer to After Visit Summary for other counseling recommendations.  Return in about 1 week (around 02/04/2017).   Adam Phenix, MD

## 2017-01-29 ENCOUNTER — Telehealth: Payer: Self-pay | Admitting: *Deleted

## 2017-01-29 NOTE — Telephone Encounter (Signed)
Attempt to contact pt.  LM On VM making her aware that FMLA paperwork is available for pick up. Paperwork left at front desk, has not been faxed as pt spouse needs to fill out forms as well.

## 2017-01-30 LAB — STREP GP B NAA: STREP GROUP B AG: NEGATIVE

## 2017-02-05 ENCOUNTER — Encounter: Payer: Self-pay | Admitting: Certified Nurse Midwife

## 2017-02-05 ENCOUNTER — Ambulatory Visit (INDEPENDENT_AMBULATORY_CARE_PROVIDER_SITE_OTHER): Payer: Medicaid Other | Admitting: Certified Nurse Midwife

## 2017-02-05 VITALS — BP 127/84 | HR 100 | Wt 223.0 lb

## 2017-02-05 DIAGNOSIS — Z3483 Encounter for supervision of other normal pregnancy, third trimester: Secondary | ICD-10-CM

## 2017-02-05 DIAGNOSIS — Z3480 Encounter for supervision of other normal pregnancy, unspecified trimester: Secondary | ICD-10-CM

## 2017-02-05 DIAGNOSIS — O219 Vomiting of pregnancy, unspecified: Secondary | ICD-10-CM

## 2017-02-05 DIAGNOSIS — D582 Other hemoglobinopathies: Secondary | ICD-10-CM

## 2017-02-05 NOTE — Progress Notes (Signed)
   PRENATAL VISIT NOTE  Subjective:  Alicia Rosario is a 36 y.o. [id number] at [redacted]w[redacted]d being seen today for ongoing prenatal care.  She is currently monitored for the following issues for this low-risk pregnancy and has Carpal tunnel syndrome; IBS (irritable bowel syndrome); Neuropathy of right hand; MDD (major depressive disorder); Mood disorder (HCC); Bipolar II disorder (HCC); GAD (generalized anxiety disorder); Self-mutilation; Supervision of other normal pregnancy, antepartum; Nausea and vomiting during pregnancy; and Hemoglobin C (Hb-C) (HCC) on her problem list.  Patient reports no complaints.   .  .  Movement: Present. Denies leaking of fluid.   The following portions of the patient's history were reviewed and updated as appropriate: allergies, current medications, past family history, past medical history, past social history, past surgical history and problem list. Problem list updated.  Objective:   Vitals:   02/05/17 1504  BP: 127/84  Pulse: 100  Weight: 223 lb (101.2 kg)    Fetal Status: Fetal Heart Rate (bpm): 135 Fundal Height: 38 cm Movement: Present     General:  Alert, oriented and cooperative. Patient is in no acute distress.  Skin: Skin is warm and dry. No rash noted.   Cardiovascular: Normal heart rate noted  Respiratory: Normal respiratory effort, no problems with respiration noted  Abdomen: Soft, gravid, appropriate for gestational age. Pain/Pressure: Present     Pelvic:  Cervical exam deferred        Extremities: Normal range of motion.  Edema: Trace  Mental Status: Normal mood and affect. Normal behavior. Normal judgment and thought content.   Assessment and Plan:  Pregnancy: A5W0981 at [redacted]w[redacted]d  1. Supervision of other normal pregnancy, antepartum     Doing well  2. Nausea and vomiting during pregnancy       3. Hemoglobin C (Hb-C) (HCC)      Term labor symptoms and general obstetric precautions including but not limited to vaginal bleeding, contractions,  leaking of fluid and fetal movement were reviewed in detail with the patient. Please refer to After Visit Summary for other counseling recommendations.  Return in about 1 week (around 02/12/2017) for ROB.   Roe Coombs, CNM

## 2017-02-08 ENCOUNTER — Inpatient Hospital Stay (HOSPITAL_COMMUNITY)
Admission: AD | Admit: 2017-02-08 | Discharge: 2017-02-08 | Disposition: A | Discharge status: Home / Self Care | Payer: Medicaid Other | Source: Ambulatory Visit | Attending: Obstetrics & Gynecology | Admitting: Obstetrics & Gynecology

## 2017-02-08 ENCOUNTER — Encounter (HOSPITAL_COMMUNITY): Payer: Self-pay | Admitting: *Deleted

## 2017-02-08 DIAGNOSIS — O471 False labor at or after 37 completed weeks of gestation: Secondary | ICD-10-CM | POA: Diagnosis not present

## 2017-02-08 NOTE — MAU Note (Signed)
Pt presents to MAU with complaints of contractions that started last night with yellow discharge. Denies any vagina bleeding

## 2017-02-12 ENCOUNTER — Ambulatory Visit (INDEPENDENT_AMBULATORY_CARE_PROVIDER_SITE_OTHER): Payer: Medicaid Other | Admitting: Certified Nurse Midwife

## 2017-02-12 ENCOUNTER — Encounter: Payer: Self-pay | Admitting: Certified Nurse Midwife

## 2017-02-12 DIAGNOSIS — Z3483 Encounter for supervision of other normal pregnancy, third trimester: Secondary | ICD-10-CM

## 2017-02-12 DIAGNOSIS — Z3480 Encounter for supervision of other normal pregnancy, unspecified trimester: Secondary | ICD-10-CM

## 2017-02-12 NOTE — Progress Notes (Signed)
Patient reports good fetal movement, and complains of cramps with inconsistent back pain, denies bleeding.

## 2017-02-12 NOTE — Progress Notes (Signed)
   PRENATAL VISIT NOTE  Subjective:  Alicia Rosario is a 36 y.o. G2X5284 at [redacted]w[redacted]d being seen today for ongoing prenatal care.  She is currently monitored for the following issues for this low-risk pregnancy and has Carpal tunnel syndrome; IBS (irritable bowel syndrome); Neuropathy of right hand; MDD (major depressive disorder); Mood disorder (HCC); Bipolar II disorder (HCC); GAD (generalized anxiety disorder); Self-mutilation; Supervision of other normal pregnancy, antepartum; Nausea and vomiting during pregnancy; and Hemoglobin C (Hb-C) (HCC) on her problem list.  Patient reports irregular ucx,seen at MAU 02/08/17 ,was discharged for false labor.  Contractions: Irritability. Vag. Bleeding: None.  Movement: Present. Denies leaking of fluid.   The following portions of the patient's history were reviewed and updated as appropriate: allergies, current medications, past family history, past medical history, past social history, past surgical history and problem list. Problem list updated.  Objective:   Vitals:   02/12/17 1044  BP: 126/82  Pulse: 80  Weight: 222 lb (100.7 kg)    Fetal Status: Fetal Heart Rate (bpm): 136 Fundal Height: 39 cm Movement: Present     General:  Alert, oriented and cooperative. Patient is in no acute distress.  Skin: Skin is warm and dry. No rash noted.   Cardiovascular: Normal heart rate noted  Respiratory: Normal respiratory effort, no problems with respiration noted  Abdomen: Soft, gravid, appropriate for gestational age. Pain/Pressure: Present     Pelvic:  Cervical exam deferred        Extremities: Normal range of motion.  Edema: Trace  Mental Status: Normal mood and affect. Normal behavior. Normal judgment and thought content.   Assessment and Plan:  Pregnancy: X3K4401 at [redacted]w[redacted]d  1. Supervision of other normal pregnancy, antepartum     Doing well.    Term labor symptoms and general obstetric precautions including but not limited to vaginal bleeding,  contractions, leaking of fluid and fetal movement were reviewed in detail with the patient. Please refer to After Visit Summary for other counseling recommendations.  No Follow-up on file.   Roe Coombs, CNM

## 2017-02-16 ENCOUNTER — Encounter (HOSPITAL_COMMUNITY): Payer: Self-pay | Admitting: *Deleted

## 2017-02-16 ENCOUNTER — Inpatient Hospital Stay (HOSPITAL_COMMUNITY)
Admission: AD | Admit: 2017-02-16 | Discharge: 2017-02-20 | DRG: 765 | Disposition: A | Discharge status: Home / Self Care | Payer: Medicaid Other | Source: Ambulatory Visit | Attending: Obstetrics & Gynecology | Admitting: Obstetrics & Gynecology

## 2017-02-16 DIAGNOSIS — Z87891 Personal history of nicotine dependence: Secondary | ICD-10-CM

## 2017-02-16 DIAGNOSIS — Z6838 Body mass index (BMI) 38.0-38.9, adult: Secondary | ICD-10-CM

## 2017-02-16 DIAGNOSIS — Z3480 Encounter for supervision of other normal pregnancy, unspecified trimester: Secondary | ICD-10-CM

## 2017-02-16 DIAGNOSIS — F3181 Bipolar II disorder: Secondary | ICD-10-CM | POA: Diagnosis present

## 2017-02-16 DIAGNOSIS — O99214 Obesity complicating childbirth: Secondary | ICD-10-CM | POA: Diagnosis present

## 2017-02-16 DIAGNOSIS — O368130 Decreased fetal movements, third trimester, not applicable or unspecified: Secondary | ICD-10-CM | POA: Diagnosis present

## 2017-02-16 DIAGNOSIS — O9902 Anemia complicating childbirth: Secondary | ICD-10-CM | POA: Diagnosis present

## 2017-02-16 DIAGNOSIS — D509 Iron deficiency anemia, unspecified: Secondary | ICD-10-CM | POA: Diagnosis present

## 2017-02-16 DIAGNOSIS — Z3A39 39 weeks gestation of pregnancy: Secondary | ICD-10-CM

## 2017-02-16 DIAGNOSIS — O9962 Diseases of the digestive system complicating childbirth: Secondary | ICD-10-CM | POA: Diagnosis present

## 2017-02-16 DIAGNOSIS — O99344 Other mental disorders complicating childbirth: Secondary | ICD-10-CM | POA: Diagnosis present

## 2017-02-16 MED ORDER — LACTATED RINGERS IV BOLUS (SEPSIS)
1000.0000 mL | Freq: Once | INTRAVENOUS | Status: AC
  Administered 2017-02-16: 1000 mL via INTRAVENOUS

## 2017-02-16 MED ORDER — LACTATED RINGERS IV SOLN: INTRAVENOUS | Status: DC

## 2017-02-16 NOTE — MAU Note (Signed)
PT  SAYS SHE NOTICED  BLOODY SHOW  AND  PARTS  OF PLUG  CAME  OUT.  NO VE  IN OFFICE.   FELT BABY MOVE  LAST -   SINCE  Sunday  NIGHT .   DENIES HSV AND  MRSA.   GBS-  UNSURE       FEELS   SOME  UC

## 2017-02-16 NOTE — H&P (Addendum)
LABOR AND DELIVERY ADMISSION HISTORY AND PHYSICAL NOTE  Alicia Rosario is a 36 y.o. female [phone] with IUP at [redacted]w[redacted]d by LMP presenting for decrease fetal movement in the past 24 hr and bloody show. Patient was followed for low risk pregnancy and had an uncomplicated pregnancy with good prenatal care. She reports positive fetal movement. She denies leakage of fluid or vaginal bleeding.  Prenatal History/Complications:  Past Medical History: Past Medical History:  Diagnosis Date  . Bipolar II disorder (HCC)   . Depression   . Hypertension   . Irritable bowel syndrome (IBS)     Past Surgical History: Past Surgical History:  Procedure Laterality Date  . CHOLECYSTECTOMY    . MOUTH SURGERY      Obstetrical History: OB History    Gravida Para Term Preterm AB Living   SAB TAB Ectopic Multiple Live Births   2       2      Social History: Social History   Social History  . Marital status: Single    Spouse name: N/A  . Number of children: N/A  . Years of education: N/A   Social History Main Topics  . Smoking status: Former Games developer  . Smokeless tobacco: Never Used  . Alcohol use No     Comment: social  . Drug use: No  . Sexual activity: Yes   Other Topics Concern  . None   Social History Narrative  . None    Family History: Family History  Problem Relation Age of Onset  . Mental illness Mother   . Hyperlipidemia Father   . Cancer Paternal Grandmother     Breast CA    Allergies: No Known Allergies  Prescriptions Prior to Admission  Medication Sig Dispense Refill Last Dose  . acetaminophen (TYLENOL) 500 MG tablet Take 1,000 mg by mouth every 6 (six) hours as needed for mild pain, moderate pain or headache.    Past Week at Unknown time  . Doxylamine-Pyridoxine (DICLEGIS) 10-10 MG TBEC Take 1-2 tablets by mouth 3 (three) times daily. Take 1 tablet in the morning, 1 tablet in the afternoon and 2 tablets at bedtime.   02/16/2017 at Unknown time  .  hydrOXYzine (VISTARIL) 25 MG capsule Take 25 mg by mouth at bedtime.   02/15/2017 at Unknown time  . loratadine (CLARITIN) 10 MG tablet Take 10 mg by mouth daily.   02/16/2017 at Unknown time  . metoCLOPramide (REGLAN) 5 MG tablet Take 1 tablet (5 mg total) by mouth 3 (three) times daily before meals. 90 tablet 2 02/16/2017 at Unknown time  . pantoprazole (PROTONIX) 40 MG tablet Take 1 tablet (40 mg total) by mouth daily. 30 tablet 2 02/16/2017 at Unknown time  . Prenatal Vit-Fe Fumarate-FA (PRENATAL MULTIVITAMIN) TABS tablet Take 1 tablet by mouth daily. 30 tablet 2 02/16/2017 at Unknown time     Review of Systems   All systems reviewed and negative except as stated in HPI  Blood pressure 134/82, pulse 100, temperature 98 F (36.7 C), temperature source Oral, resp. rate 20, height  (1.626 m), weight 226 lb 4 oz (102.6 kg), last menstrual period 05/18/2016. General appearance: alert, cooperative and appears stated age Lungs: normal respiratory effort, no audible wheezing Heart: regular rate and pulses palpated bilaterally Abdomen: soft, non-tender; bowel sounds normal Extremities: No calf swelling or tenderness Presentation: cephalic by nurse exam Fetal monitoring: FHR150, deceleration, minimal variability, no accel Uterine activity: minimal contraction  Dilation: 2 Effacement (%): 50 Station: -2 Exam by:: BENJI, RN   Prenatal labs: ABO, Rh: O/Positive/-- (11/13 1613) Antibody: Negative (11/13 1613) Rubella: Immune RPR: Non Reactive (02/05 1051)  HBsAg: Negative (11/13 1613)  HIV: Non Reactive (02/05 1051)  GBS: Negative (04/04 1359)  1 hr Glucola: 89 Genetic screening: Negative Anatomy US: Normal  Prenatal Transfer Tool  Maternal Diabetes: No Genetic Screening: Normal Maternal Ultrasounds/Referrals: Normal Fetal Ultrasounds or other Referrals:  None Maternal Substance Abuse:  No Significant Maternal Medications:  Meds include: Protonix Other: Hydroxyzine,  metoclopromide Significant Maternal Lab Results: None  No results found for this or any previous visit (from the past 24 hour(s)).  Patient Active Problem List   Diagnosis Date Noted  . 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 Nausea and vomiting during pregnancy 11/27/2016  . Supervision of other normal pregnancy, antepartum 10/06/2016  . 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 Bipolar II disorder (HCC) 08/02/2014  . GAD (generalized anxiety disorder) 08/02/2014  . Mood disorder (HCC) 06/26/2014  . IBS (irritable bowel syndrome) 04/19/2014  . 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 MDD (major depressive disorder) 04/19/2014  . Carpal tunnel syndrome 02/22/2014    Assessment: Alicia Rosario is a 36 y.o. X3K4401 at [redacted]w[redacted]d here for decrease fetal movement in the past 24hr and a few decelerations with minimal variation in tracings. Will admit for expectant management with induction/augmentation if clinically necessary.  #Labor: induction and augmentation #Pain: IV pain meds and epidural if desired #FWB:  Few decels, minimal variation, FHR 150 #ID:  GBS neg  #MOF: Breast/Bottle #MOC: BTL vs Mirena (undecided) #Circ:  N/A  Abdoulaye Diallo, PGY-1 02/16/2017, 11:58 PM Fetal heartrate tracing is not reassuring remote from delivery. I have called for cesarean delivery.The risks of cesarean section discussed with the patient included but were not limited to: bleeding which may require transfusion or reoperation; infection which may require antibiotics; injury to bowel, bladder, ureters or other surrounding organs; injury to the fetus; need for additional procedures including hysterectomy in the event of a life-threatening hemorrhage; placental abnormalities wth subsequent pregnancies, incisional problems, thromboembolic phenomenon and other postoperative/anesthesia complications. The patient concurred with the proposed plan, giving informed written consent for the procedure.   Patient has been NPO since  1930 she will remain NPO for procedure. Anesthesia and OR aware. Preoperative prophylactic antibiotics and SCDs ordered on call to the OR.  To OR when ready.  Adam Phenix, MD 02/17/2017 1:09 AM

## 2017-02-17 ENCOUNTER — Encounter (HOSPITAL_COMMUNITY): Payer: Self-pay

## 2017-02-17 ENCOUNTER — Encounter (HOSPITAL_COMMUNITY)
Admission: AD | Disposition: A | Discharge status: Home / Self Care | Payer: Self-pay | Source: Ambulatory Visit | Attending: Obstetrics & Gynecology

## 2017-02-17 ENCOUNTER — Inpatient Hospital Stay (HOSPITAL_COMMUNITY): Payer: Medicaid Other | Admitting: Anesthesiology

## 2017-02-17 DIAGNOSIS — Z87891 Personal history of nicotine dependence: Secondary | ICD-10-CM | POA: Diagnosis not present

## 2017-02-17 DIAGNOSIS — O368130 Decreased fetal movements, third trimester, not applicable or unspecified: Secondary | ICD-10-CM | POA: Diagnosis present

## 2017-02-17 DIAGNOSIS — D509 Iron deficiency anemia, unspecified: Secondary | ICD-10-CM | POA: Diagnosis present

## 2017-02-17 DIAGNOSIS — O99344 Other mental disorders complicating childbirth: Secondary | ICD-10-CM | POA: Diagnosis present

## 2017-02-17 DIAGNOSIS — F3181 Bipolar II disorder: Secondary | ICD-10-CM | POA: Diagnosis present

## 2017-02-17 DIAGNOSIS — O9902 Anemia complicating childbirth: Secondary | ICD-10-CM | POA: Diagnosis present

## 2017-02-17 DIAGNOSIS — O99214 Obesity complicating childbirth: Secondary | ICD-10-CM | POA: Diagnosis present

## 2017-02-17 DIAGNOSIS — O9962 Diseases of the digestive system complicating childbirth: Secondary | ICD-10-CM | POA: Diagnosis present

## 2017-02-17 DIAGNOSIS — Z6838 Body mass index (BMI) 38.0-38.9, adult: Secondary | ICD-10-CM | POA: Diagnosis not present

## 2017-02-17 DIAGNOSIS — Z3A39 39 weeks gestation of pregnancy: Secondary | ICD-10-CM | POA: Diagnosis not present

## 2017-02-17 LAB — CBC
HEMATOCRIT: 35.5 % — AB (ref 36.0–46.0)
Hemoglobin: 12.4 g/dL (ref 12.0–15.0)
MCH: 25.6 pg — AB (ref 26.0–34.0)
MCHC: 34.9 g/dL (ref 30.0–36.0)
MCV: 73.3 fL — ABNORMAL LOW (ref 78.0–100.0)
Platelets: 157 10*3/uL (ref 150–400)
RBC: 4.84 MIL/uL (ref 3.87–5.11)
RDW: 14.3 % (ref 11.5–15.5)
WBC: 10.4 10*3/uL (ref 4.0–10.5)

## 2017-02-17 LAB — TYPE AND SCREEN
ABO/RH(D): O POS
Antibody Screen: NEGATIVE

## 2017-02-17 LAB — RPR: RPR: NONREACTIVE

## 2017-02-17 SURGERY — Surgical Case
Anesthesia: Spinal

## 2017-02-17 MED ORDER — LACTATED RINGERS IV SOLN: 500.0000 mL | INTRAVENOUS | Status: DC | PRN

## 2017-02-17 MED ORDER — SIMETHICONE 80 MG PO CHEW
80.0000 mg | CHEWABLE_TABLET | ORAL | Status: DC
  Administered 2017-02-18 – 2017-02-19 (×3): 80 mg via ORAL
  Filled 2017-02-17 (×2): qty 1

## 2017-02-17 MED ORDER — ONDANSETRON HCL 4 MG/2ML IJ SOLN
INTRAMUSCULAR | Status: DC | PRN
  Administered 2017-02-17: 4 mg via INTRAVENOUS

## 2017-02-17 MED ORDER — SOD CITRATE-CITRIC ACID 500-334 MG/5ML PO SOLN
ORAL | Status: AC
  Filled 2017-02-17: qty 15

## 2017-02-17 MED ORDER — NALOXONE HCL 0.4 MG/ML IJ SOLN: 0.4000 mg | INTRAMUSCULAR | Status: DC | PRN

## 2017-02-17 MED ORDER — WITCH HAZEL-GLYCERIN EX PADS: 1.0000 "application " | MEDICATED_PAD | CUTANEOUS | Status: DC | PRN

## 2017-02-17 MED ORDER — MEPERIDINE HCL 25 MG/ML IJ SOLN: 6.2500 mg | INTRAMUSCULAR | Status: DC | PRN

## 2017-02-17 MED ORDER — TETANUS-DIPHTH-ACELL PERTUSSIS 5-2.5-18.5 LF-MCG/0.5 IM SUSP: 0.5000 mL | Freq: Once | INTRAMUSCULAR | Status: DC

## 2017-02-17 MED ORDER — TERBUTALINE SULFATE 1 MG/ML IJ SOLN: 0.2500 mg | Freq: Once | INTRAMUSCULAR | Status: DC | PRN

## 2017-02-17 MED ORDER — KETOROLAC TROMETHAMINE 30 MG/ML IJ SOLN: 30.0000 mg | Freq: Four times a day (QID) | INTRAMUSCULAR | Status: AC | PRN

## 2017-02-17 MED ORDER — ONDANSETRON HCL 4 MG/2ML IJ SOLN: 4.0000 mg | Freq: Four times a day (QID) | INTRAMUSCULAR | Status: DC | PRN

## 2017-02-17 MED ORDER — BUPIVACAINE HCL (PF) 0.5 % IJ SOLN
INTRAMUSCULAR | Status: DC | PRN
  Administered 2017-02-17: 30 mL

## 2017-02-17 MED ORDER — KETOROLAC TROMETHAMINE 30 MG/ML IJ SOLN
INTRAMUSCULAR | Status: AC
  Administered 2017-02-17: 30 mg via INTRAMUSCULAR
  Filled 2017-02-17: qty 1

## 2017-02-17 MED ORDER — DIPHENHYDRAMINE HCL 50 MG/ML IJ SOLN: 12.5000 mg | INTRAMUSCULAR | Status: DC | PRN

## 2017-02-17 MED ORDER — OXYTOCIN 40 UNITS IN LACTATED RINGERS INFUSION - SIMPLE MED: 1.0000 m[IU]/min | INTRAVENOUS | Status: DC

## 2017-02-17 MED ORDER — SOD CITRATE-CITRIC ACID 500-334 MG/5ML PO SOLN
ORAL | Status: AC
  Administered 2017-02-17: 30 mL
  Filled 2017-02-17: qty 15

## 2017-02-17 MED ORDER — SCOPOLAMINE 1 MG/3DAYS TD PT72
MEDICATED_PATCH | TRANSDERMAL | Status: AC
  Filled 2017-02-17: qty 1

## 2017-02-17 MED ORDER — NALBUPHINE HCL 10 MG/ML IJ SOLN: 5.0000 mg | Freq: Once | INTRAMUSCULAR | Status: DC | PRN

## 2017-02-17 MED ORDER — LACTATED RINGERS IV SOLN
INTRAVENOUS | Status: DC
  Administered 2017-02-17 (×2): via INTRAVENOUS

## 2017-02-17 MED ORDER — OXYTOCIN 40 UNITS IN LACTATED RINGERS INFUSION - SIMPLE MED: 2.5000 [IU]/h | INTRAVENOUS | Status: AC

## 2017-02-17 MED ORDER — SCOPOLAMINE 1 MG/3DAYS TD PT72
MEDICATED_PATCH | TRANSDERMAL | Status: DC | PRN
  Administered 2017-02-17: 1 via TRANSDERMAL

## 2017-02-17 MED ORDER — FENTANYL CITRATE (PF) 100 MCG/2ML IJ SOLN
INTRAMUSCULAR | Status: DC | PRN
  Administered 2017-02-17: 20 ug via INTRATHECAL

## 2017-02-17 MED ORDER — DIBUCAINE 1 % RE OINT: 1.0000 "application " | TOPICAL_OINTMENT | RECTAL | Status: DC | PRN

## 2017-02-17 MED ORDER — CEFAZOLIN SODIUM-DEXTROSE 2-3 GM-% IV SOLR
INTRAVENOUS | Status: DC | PRN
  Administered 2017-02-17: 2 g via INTRAVENOUS

## 2017-02-17 MED ORDER — KETOROLAC TROMETHAMINE 30 MG/ML IJ SOLN
INTRAMUSCULAR | Status: AC
  Filled 2017-02-17: qty 1

## 2017-02-17 MED ORDER — OXYTOCIN 10 UNIT/ML IJ SOLN
INTRAMUSCULAR | Status: AC
  Filled 2017-02-17: qty 4

## 2017-02-17 MED ORDER — LIDOCAINE HCL (PF) 1 % IJ SOLN: 30.0000 mL | INTRAMUSCULAR | Status: DC | PRN

## 2017-02-17 MED ORDER — BUPIVACAINE IN DEXTROSE 0.75-8.25 % IT SOLN
INTRATHECAL | Status: AC
  Filled 2017-02-17: qty 2

## 2017-02-17 MED ORDER — OXYTOCIN 10 UNIT/ML IJ SOLN
INTRAVENOUS | Status: DC | PRN
  Administered 2017-02-17: 40 [IU] via INTRAVENOUS

## 2017-02-17 MED ORDER — IBUPROFEN 600 MG PO TABS: 600.0000 mg | ORAL_TABLET | Freq: Four times a day (QID) | ORAL | Status: DC | PRN

## 2017-02-17 MED ORDER — PHENYLEPHRINE 8 MG IN D5W 100 ML (0.08MG/ML) PREMIX OPTIME
INJECTION | INTRAVENOUS | Status: AC
  Filled 2017-02-17: qty 100

## 2017-02-17 MED ORDER — SENNOSIDES-DOCUSATE SODIUM 8.6-50 MG PO TABS
2.0000 | ORAL_TABLET | ORAL | Status: DC
  Administered 2017-02-18 – 2017-02-19 (×3): 2 via ORAL
  Filled 2017-02-17 (×3): qty 2

## 2017-02-17 MED ORDER — ACETAMINOPHEN 500 MG PO TABS
1000.0000 mg | ORAL_TABLET | Freq: Four times a day (QID) | ORAL | Status: AC
  Administered 2017-02-17 – 2017-02-18 (×4): 1000 mg via ORAL
  Filled 2017-02-17 (×4): qty 2

## 2017-02-17 MED ORDER — BUPIVACAINE IN DEXTROSE 0.75-8.25 % IT SOLN
INTRATHECAL | Status: DC | PRN
  Administered 2017-02-17: 10.5 mg via INTRATHECAL

## 2017-02-17 MED ORDER — NALOXONE HCL 2 MG/2ML IJ SOSY
1.0000 ug/kg/h | PREFILLED_SYRINGE | INTRAVENOUS | Status: DC | PRN
  Filled 2017-02-17: qty 2

## 2017-02-17 MED ORDER — LACTATED RINGERS IV SOLN
INTRAVENOUS | Status: DC | PRN
  Administered 2017-02-17: 02:00:00 via INTRAVENOUS

## 2017-02-17 MED ORDER — KETOROLAC TROMETHAMINE 30 MG/ML IJ SOLN
30.0000 mg | Freq: Four times a day (QID) | INTRAMUSCULAR | Status: AC | PRN
  Administered 2017-02-17: 30 mg via INTRAMUSCULAR

## 2017-02-17 MED ORDER — COCONUT OIL OIL
1.0000 | TOPICAL_OIL | Status: DC | PRN
Start: 2017-02-17 — End: 2017-02-20

## 2017-02-17 MED ORDER — MENTHOL 3 MG MT LOZG: 1.0000 | LOZENGE | OROMUCOSAL | Status: DC | PRN

## 2017-02-17 MED ORDER — LACTATED RINGERS IV SOLN: INTRAVENOUS | Status: DC

## 2017-02-17 MED ORDER — SCOPOLAMINE 1 MG/3DAYS TD PT72
1.0000 | MEDICATED_PATCH | Freq: Once | TRANSDERMAL | Status: DC
  Filled 2017-02-17: qty 1

## 2017-02-17 MED ORDER — SIMETHICONE 80 MG PO CHEW
80.0000 mg | CHEWABLE_TABLET | ORAL | Status: DC | PRN
  Filled 2017-02-17: qty 1

## 2017-02-17 MED ORDER — NALBUPHINE HCL 10 MG/ML IJ SOLN: 5.0000 mg | INTRAMUSCULAR | Status: DC | PRN

## 2017-02-17 MED ORDER — DIPHENHYDRAMINE HCL 25 MG PO CAPS
25.0000 mg | ORAL_CAPSULE | ORAL | Status: DC | PRN
  Filled 2017-02-17: qty 1

## 2017-02-17 MED ORDER — OXYCODONE-ACETAMINOPHEN 5-325 MG PO TABS: 2.0000 | ORAL_TABLET | ORAL | Status: DC | PRN

## 2017-02-17 MED ORDER — KETOROLAC TROMETHAMINE 30 MG/ML IJ SOLN: 30.0000 mg | Freq: Once | INTRAMUSCULAR | Status: DC | PRN

## 2017-02-17 MED ORDER — SOD CITRATE-CITRIC ACID 500-334 MG/5ML PO SOLN: 30.0000 mL | ORAL | Status: DC | PRN

## 2017-02-17 MED ORDER — ACETAMINOPHEN 325 MG PO TABS
650.0000 mg | ORAL_TABLET | ORAL | Status: DC | PRN
  Administered 2017-02-18 (×2): 650 mg via ORAL
  Filled 2017-02-17 (×3): qty 2

## 2017-02-17 MED ORDER — PRENATAL MULTIVITAMIN CH
1.0000 | ORAL_TABLET | Freq: Every day | ORAL | Status: DC
  Administered 2017-02-17 – 2017-02-20 (×4): 1 via ORAL
  Filled 2017-02-17 (×4): qty 1

## 2017-02-17 MED ORDER — FENTANYL CITRATE (PF) 100 MCG/2ML IJ SOLN: 100.0000 ug | INTRAMUSCULAR | Status: DC | PRN

## 2017-02-17 MED ORDER — SIMETHICONE 80 MG PO CHEW
80.0000 mg | CHEWABLE_TABLET | Freq: Three times a day (TID) | ORAL | Status: DC
  Administered 2017-02-17 – 2017-02-20 (×8): 80 mg via ORAL
  Filled 2017-02-17 (×10): qty 1

## 2017-02-17 MED ORDER — PHENYLEPHRINE 8 MG IN D5W 100 ML (0.08MG/ML) PREMIX OPTIME
INJECTION | INTRAVENOUS | Status: DC | PRN
  Administered 2017-02-17: 60 ug/min via INTRAVENOUS

## 2017-02-17 MED ORDER — ACETAMINOPHEN 325 MG PO TABS: 650.0000 mg | ORAL_TABLET | ORAL | Status: DC | PRN

## 2017-02-17 MED ORDER — CEFAZOLIN SODIUM-DEXTROSE 2-4 GM/100ML-% IV SOLN
INTRAVENOUS | Status: AC
  Filled 2017-02-17: qty 100

## 2017-02-17 MED ORDER — MORPHINE SULFATE-NACL 0.5-0.9 MG/ML-% IV SOSY
PREFILLED_SYRINGE | INTRAVENOUS | Status: DC | PRN
  Administered 2017-02-17: .2 mg via INTRATHECAL

## 2017-02-17 MED ORDER — OXYCODONE-ACETAMINOPHEN 5-325 MG PO TABS
1.0000 | ORAL_TABLET | ORAL | Status: DC | PRN
Start: 2017-02-17 — End: 2017-02-17

## 2017-02-17 MED ORDER — LACTATED RINGERS IV SOLN
INTRAVENOUS | Status: DC | PRN
  Administered 2017-02-17 (×2): via INTRAVENOUS

## 2017-02-17 MED ORDER — FENTANYL CITRATE (PF) 100 MCG/2ML IJ SOLN
INTRAMUSCULAR | Status: AC
  Filled 2017-02-17: qty 2

## 2017-02-17 MED ORDER — OXYTOCIN BOLUS FROM INFUSION: 500.0000 mL | Freq: Once | INTRAVENOUS | Status: DC

## 2017-02-17 MED ORDER — ONDANSETRON HCL 4 MG/2ML IJ SOLN
4.0000 mg | Freq: Three times a day (TID) | INTRAMUSCULAR | Status: DC | PRN
  Filled 2017-02-17: qty 2

## 2017-02-17 MED ORDER — BUPIVACAINE-EPINEPHRINE (PF) 0.5% -1:200000 IJ SOLN
INTRAMUSCULAR | Status: AC
  Filled 2017-02-17: qty 30

## 2017-02-17 MED ORDER — MORPHINE SULFATE (PF) 0.5 MG/ML IJ SOLN
INTRAMUSCULAR | Status: AC
  Filled 2017-02-17: qty 10

## 2017-02-17 MED ORDER — IBUPROFEN 600 MG PO TABS
600.0000 mg | ORAL_TABLET | Freq: Four times a day (QID) | ORAL | Status: DC
  Administered 2017-02-17 – 2017-02-20 (×13): 600 mg via ORAL
  Filled 2017-02-17 (×13): qty 1

## 2017-02-17 MED ORDER — DIPHENHYDRAMINE HCL 25 MG PO CAPS
25.0000 mg | ORAL_CAPSULE | Freq: Four times a day (QID) | ORAL | Status: DC | PRN
  Administered 2017-02-17: 25 mg via ORAL

## 2017-02-17 MED ORDER — ONDANSETRON HCL 4 MG/2ML IJ SOLN
INTRAMUSCULAR | Status: AC
  Filled 2017-02-17: qty 2

## 2017-02-17 MED ORDER — SODIUM CHLORIDE 0.9 % IR SOLN
Status: DC | PRN
  Administered 2017-02-17: 1000 mL

## 2017-02-17 MED ORDER — OXYTOCIN 40 UNITS IN LACTATED RINGERS INFUSION - SIMPLE MED: 2.5000 [IU]/h | INTRAVENOUS | Status: DC

## 2017-02-17 MED ORDER — ZOLPIDEM TARTRATE 5 MG PO TABS: 5.0000 mg | ORAL_TABLET | Freq: Every evening | ORAL | Status: DC | PRN

## 2017-02-17 MED ORDER — SODIUM CHLORIDE 0.9% FLUSH: 3.0000 mL | INTRAVENOUS | Status: DC | PRN

## 2017-02-17 SURGICAL SUPPLY — 33 items
BARRIER ADHS 3X4 INTERCEED (GAUZE/BANDAGES/DRESSINGS) IMPLANT
BRR ADH 4X3 ABS CNTRL BYND (GAUZE/BANDAGES/DRESSINGS)
CHLORAPREP W/TINT 26ML (MISCELLANEOUS) ×3 IMPLANT
CLAMP CORD UMBIL (MISCELLANEOUS) IMPLANT
CLOTH BEACON ORANGE TIMEOUT ST (SAFETY) ×3 IMPLANT
DRSG OPSITE POSTOP 4X10 (GAUZE/BANDAGES/DRESSINGS) ×3 IMPLANT
ELECT REM PT RETURN 9FT ADLT (ELECTROSURGICAL) ×3
ELECTRODE REM PT RTRN 9FT ADLT (ELECTROSURGICAL) ×1 IMPLANT
EXTRACTOR VACUUM KIWI (MISCELLANEOUS) IMPLANT
GAUZE SPONGE 4X4 12PLY STRL LF (GAUZE/BANDAGES/DRESSINGS) ×6 IMPLANT
GLOVE BIO SURGEON STRL SZ 6.5 (GLOVE) ×2 IMPLANT
GLOVE BIO SURGEONS STRL SZ 6.5 (GLOVE) ×1
GLOVE BIOGEL PI IND STRL 7.0 (GLOVE) ×2 IMPLANT
GLOVE BIOGEL PI INDICATOR 7.0 (GLOVE) ×4
GOWN STRL REUS W/TWL LRG LVL3 (GOWN DISPOSABLE) ×6 IMPLANT
KIT ABG SYR 3ML LUER SLIP (SYRINGE) IMPLANT
NEEDLE HYPO 22GX1.5 SAFETY (NEEDLE) IMPLANT
NEEDLE HYPO 25X5/8 SAFETYGLIDE (NEEDLE) IMPLANT
NS IRRIG 1000ML POUR BTL (IV SOLUTION) ×3 IMPLANT
PACK C SECTION WH (CUSTOM PROCEDURE TRAY) ×3 IMPLANT
PAD ABD 7.5X8 STRL (GAUZE/BANDAGES/DRESSINGS) ×3 IMPLANT
PAD OB MATERNITY 4.3X12.25 (PERSONAL CARE ITEMS) ×3 IMPLANT
PENCIL SMOKE EVAC W/HOLSTER (ELECTROSURGICAL) ×3 IMPLANT
RETRACTOR WND ALEXIS 25 LRG (MISCELLANEOUS) IMPLANT
RTRCTR WOUND ALEXIS 25CM LRG (MISCELLANEOUS)
SUT PLAIN 2 0 XLH (SUTURE) ×3 IMPLANT
SUT VIC AB 0 CT1 36 (SUTURE) ×18 IMPLANT
SUT VIC AB 2-0 CT1 27 (SUTURE) ×3
SUT VIC AB 2-0 CT1 TAPERPNT 27 (SUTURE) ×1 IMPLANT
SUT VIC AB 4-0 PS2 27 (SUTURE) ×3 IMPLANT
SYR CONTROL 10ML LL (SYRINGE) IMPLANT
TOWEL OR 17X24 6PK STRL BLUE (TOWEL DISPOSABLE) ×3 IMPLANT
TRAY FOLEY BAG SILVER LF 14FR (SET/KITS/TRAYS/PACK) IMPLANT

## 2017-02-17 NOTE — Lactation Note (Signed)
This note was copied from a baby's chart. Lactation Consultation Note  Patient Name: Alicia Rosario [id number] Date: 02/17/2017 Reason for consult: Initial assessment;NICU baby Breastfeeding consultation services and support information given to patient.  Providing Breastmilk For Your Baby In NICU booklet also given for review.  Mom breastfed her last baby.  RN initiated symphony pump this AM and mom obtained a few mls of colostrum.  Reviewed pump operation with mom and instructed to pump 8-12 times/24 hours.  WIC referral faxed to [facility] office for pump after discharge.  Encouraged to call with questions/concerns.  Maternal Data Has patient been taught Hand Expression?: Yes Does the patient have breastfeeding experience prior to this delivery?: Yes  Feeding    LATCH Score/Interventions                      Lactation Tools Discussed/Used WIC Program: Yes Pump Review: Setup, frequency, and cleaning;Milk Storage Initiated by:: RN Date initiated:: 02/17/17   Consult Status Consult Status: Follow-up Date: 02/18/17 Follow-up type: In-patient    Huston Foley 02/17/2017, 11:28 AM

## 2017-02-17 NOTE — Consult Note (Signed)
Neonatology Note:   Attendance at C-section:    I was asked by Dr. Debroah Loop to attend this primary C/S at 39 1/7 weeks due to [facility]. The mother is a G5P2A2 O pos, GBS neg who presented with decreased fetal movement and has had decreased FHR variability and decelerations tonight. She has an extensive psychiatric history (bipolar, anxiety/depression, self-mutilation), but had good PNC and the pregnancy was uncomplicated up until last 24 hours. ROM at delivery, fluid clear. Infant had some tone at birth, but did not cry despite being bulb suctioned and stimulated by Dr. Debroah Loop; cord clamping delayed for about 30-40 seconds, then stopped. Infant floppy, blue, and apneic, but with HR about 100. Bulb suctioned, then PPV was applied. HR came up quickly, but the baby did not begin to breathe until about 2.5 minutes. She had subcostal retractions. Neopuff CPAP was utilized; she maintained HR normal, but little air movement could be heard and O2 saturations remained in the 60s despite 100% FIO2. Continued PPV.  Intubated atraumatically on the first attempt at 10 minutes, using a 3.5 mm ETT, placed to depth of 8.5 cm at the lips. The CO2 detector turned yellow immediately, breath sounds equal bilaterally. O2 sats gradually rose, but were still no higher than 82% at time of transport to NICU. Seen by parents briefly in OR, father attended her en route to NICU.  Ap 2/5/9.   Doretha Sou, MD

## 2017-02-17 NOTE — Transfer of Care (Signed)
Immediate Anesthesia Transfer of Care Note  Patient: Alicia Rosario  Procedure(s) Performed: Procedure(s): CESAREAN SECTION (N/A)  Patient Location: PACU  Anesthesia Type:Spinal  Level of Consciousness: awake  Airway & Oxygen Therapy: Patient Spontanous Breathing  Post-op Assessment: Report given to RN and Post -op Vital signs reviewed and stable  Post vital signs: stable  Last Vitals:  Vitals:   02/16/17 2316 02/17/17 0015  BP: 134/82 122/84  Pulse: 100 81  Resp: 20 18  Temp: 36.7 C 37.2 C    Last Pain:  Vitals:   02/17/17 0032  TempSrc:   PainSc: 6          Complications: No apparent anesthesia complications

## 2017-02-17 NOTE — Addendum Note (Signed)
Addendum  created 02/17/17 0809 by Rica Records, CRNA   Sign clinical note

## 2017-02-17 NOTE — Anesthesia Postprocedure Evaluation (Addendum)
Anesthesia Post Note  Patient: Alicia Rosario  Procedure(s) Performed: Procedure(s) (LRB): CESAREAN SECTION (N/A)  Patient location during evaluation: Mother Baby Anesthesia Type: Spinal Level of consciousness: oriented and awake and alert Pain management: pain level controlled Vital Signs Assessment: post-procedure vital signs reviewed and stable Respiratory status: spontaneous breathing, respiratory function stable and patient connected to nasal cannula oxygen Cardiovascular status: blood pressure returned to baseline and stable Postop Assessment: no headache, no backache and patient able to bend at knees Anesthetic complications: no        Last Vitals:  Vitals:   02/17/17 0535 02/17/17 0730  BP: 113/64 97/64  Pulse: 81 68  Resp: 18 18  Temp: 36.6 C 36.9 C    Last Pain:  Vitals:   02/17/17 0730  TempSrc: Oral  PainSc: 0-No pain   Pain Goal:                 Rica Records

## 2017-02-17 NOTE — Progress Notes (Signed)
Dr. Debroah Loop at bedside to review fetal heart rate tracing. Discussed risks and benefits for cesarean section. Patient agrees and will be prepared for OR.

## 2017-02-17 NOTE — Op Note (Signed)
Alicia Rosario PROCEDURE DATE: 02/17/2017  PREOPERATIVE DIAGNOSES: Intrauterine pregnancy at [redacted]w[redacted]d weeks gestation; non-reassuring fetal status  POSTOPERATIVE DIAGNOSES: The same  PROCEDURE: Primary Low Transverse Cesarean Section  SURGEON: Adam Phenix, MD   ASSISTANT:  n/a  ANESTHESIOLOGIST: Dr. Arby Barrette  INDICATIONS: Alicia Rosario is a 36 y.o. [phone] at [redacted]w[redacted]d here for cesarean section secondary to the indications listed under preoperative diagnoses; please see preoperative note for further details.  The risks of cesarean section were discussed with the patient including but were not limited to: bleeding which may require transfusion or reoperation; infection which may require antibiotics; injury to bowel, bladder, ureters or other surrounding organs; injury to the fetus; need for additional procedures including hysterectomy in the event of a life-threatening hemorrhage; placental abnormalities wth subsequent pregnancies, incisional problems, thromboembolic phenomenon and other postoperative/anesthesia complications.   The patient concurred with the proposed plan, giving informed written consent for the procedure.    FINDINGS:  Viable female infant in cephalic presentation.  Apgars 2 and 5 and 9.  Clear amniotic fluid.  Intact placenta, three vessel cord.  Normal uterus, fallopian tubes and ovaries bilaterally.  ANESTHESIA: Spinall INTRAVENOUS FLUIDS: 1500 ml ESTIMATED BLOOD LOSS: 800 ml URINE OUTPUT:  50 ml SPECIMENS: Placenta sent to pathology COMPLICATIONS: None immediate  PROCEDURE IN DETAIL:  The patient preoperatively received intravenous antibiotics and had sequential compression devices applied to her lower extremities.  She was then taken to the operating room where spinal anesthesia was administered and was found to be adequate. She was then placed in a dorsal supine position with a leftward tilt, and prepped and draped in a sterile manner.  A foley catheter was placed  into her bladder and attached to constant gravity.  After an adequate timeout was performed, a Pfannenstiel skin incision was made with scalpel and carried through to the underlying layer of fascia. The fascia was incised in the midline, and this incision was extended bilaterally using the Mayo scissors.  Kocher clamps were applied to the superior aspect of the fascial incision and the underlying rectus muscles were dissected off bluntly.  A similar process was carried out on the inferior aspect of the fascial incision. The rectus muscles were separated in the midline bluntly and the peritoneum was entered bluntly. Attention was turned to the lower uterine segment where a low transverse hysterotomy was made with a scalpel and extended bilaterally bluntly.  The infant was successfully delivered, the cord was clamped and cut after one minute, and the infant was handed over to the awaiting neonatology team. Uterine massage was then administered, and the placenta delivered intact with a three-vessel cord. The uterus was then cleared of clots and debris.  The hysterotomy was closed with 0 Vicryl in a running locked fashion, and an imbricating layer was also placed with 0 Vicryl.  Figure-of-eight 0 Vicryl serosal stitches were placed to help with hemostasis.  The pelvis was cleared of all clot and debris. Hemostasis was confirmed on all surfaces.  The peritoneum was closed with a 2-0 Vicryl running stitch  The fascia was then closed using 0 Vicryl in a running fashion.  The subcutaneous layer was irrigated, then reapproximated with 2-0 plain gut interrupted stitches, and 30 ml of 0.5% Marcaine was injected subcutaneously around the incision.  The skin was closed with a 4-0 Vicryl subcuticular stitch. The patient tolerated the procedure well. Sponge, lap, instrument and needle counts were correct x 3.  She was taken to the recovery room in stable  condition.    Adam Phenix, MD Attending Obstetrician &  Gynecologist Faculty Practice, [facility]

## 2017-02-17 NOTE — Anesthesia Postprocedure Evaluation (Signed)
Anesthesia Post Note  Patient: Alicia Rosario  Procedure(s) Performed: Procedure(s) (LRB): CESAREAN SECTION (N/A)  Patient location during evaluation: PACU Anesthesia Type: Spinal Level of consciousness: awake Pain management: pain level controlled Vital Signs Assessment: post-procedure vital signs reviewed and stable Respiratory status: spontaneous breathing Cardiovascular status: stable Postop Assessment: no headache, no backache, spinal receding, patient able to bend at knees and no signs of nausea or vomiting Anesthetic complications: no        Last Vitals:  Vitals:   02/17/17 0302 02/17/17 0315  BP: 117/70 113/73  Pulse: 84 79  Resp: 19 14  Temp:  36.8 C    Last Pain:  Vitals:   02/17/17 0315  TempSrc: Oral  PainSc: 0-No pain   Pain Goal:                 Brinden Kincheloe JR,JOHN Biridiana Twardowski

## 2017-02-17 NOTE — Anesthesia Preprocedure Evaluation (Signed)
Anesthesia Evaluation  Patient identified by MRN, date of birth, ID band Patient awake    Reviewed: Allergy & Precautions, H&P , NPO status , Patient's Chart, lab work & pertinent test results  Airway Mallampati: II  TM Distance: >3 FB Neck ROM: full    Dental no notable dental hx.    Pulmonary neg pulmonary ROS, former smoker,    Pulmonary exam normal        Cardiovascular hypertension, negative cardio ROS Normal cardiovascular exam     Neuro/Psych negative neurological ROS  negative psych ROS   GI/Hepatic negative GI ROS, Neg liver ROS,   Endo/Other  Morbid obesity  Renal/GU negative Renal ROS  negative genitourinary   Musculoskeletal   Abdominal (+) + obese,   Peds  Hematology negative hematology ROS (+)   Anesthesia Other Findings   Reproductive/Obstetrics (+) Pregnancy                             Anesthesia Physical Anesthesia Plan  ASA: III  Anesthesia Plan: Spinal   Post-op Pain Management:    Induction:   Airway Management Planned:   Additional Equipment:   Intra-op Plan:   Post-operative Plan:   Informed Consent: I have reviewed the patients History and Physical, chart, labs and discussed the procedure including the risks, benefits and alternatives for the proposed anesthesia with the patient or authorized representative who has indicated his/her understanding and acceptance.     Plan Discussed with: CRNA and Surgeon  Anesthesia Plan Comments:         Anesthesia Quick Evaluation

## 2017-02-17 NOTE — Anesthesia Procedure Notes (Addendum)
Spinal  Patient location during procedure: OR Start time: 02/17/2017 1:15 AM End time: 02/17/2017 1:18 AM Staffing Anesthesiologist: Leilani Able Performed: anesthesiologist  Preanesthetic Checklist Completed: patient identified, site marked, surgical consent, pre-op evaluation, timeout performed, IV checked, risks and benefits discussed and monitors and equipment checked Spinal Block Patient position: sitting Prep: site prepped and draped and DuraPrep Patient monitoring: heart rate, cardiac monitor, continuous pulse ox and blood pressure Approach: midline Location: L3-4 Injection technique: single-shot Needle Needle type: Pencan  Needle gauge: 24 G Needle length: 9 cm Assessment Sensory level: T4

## 2017-02-17 NOTE — Addendum Note (Signed)
Addendum  created 02/17/17 0356 by Renford Dills, CRNA   Anesthesia Intra Meds edited

## 2017-02-18 ENCOUNTER — Encounter (HOSPITAL_COMMUNITY): Payer: Self-pay | Admitting: Obstetrics & Gynecology

## 2017-02-18 LAB — CBC
HEMATOCRIT: 27.7 % — AB (ref 36.0–46.0)
Hemoglobin: 9.8 g/dL — ABNORMAL LOW (ref 12.0–15.0)
MCH: 26.1 pg (ref 26.0–34.0)
MCHC: 35.4 g/dL (ref 30.0–36.0)
MCV: 73.9 fL — AB (ref 78.0–100.0)
Platelets: 118 10*3/uL — ABNORMAL LOW (ref 150–400)
RBC: 3.75 MIL/uL — ABNORMAL LOW (ref 3.87–5.11)
RDW: 14.4 % (ref 11.5–15.5)
WBC: 9.8 10*3/uL (ref 4.0–10.5)

## 2017-02-18 MED ORDER — OXYCODONE HCL 5 MG PO TABS
10.0000 mg | ORAL_TABLET | ORAL | Status: DC | PRN
  Administered 2017-02-18 – 2017-02-19 (×5): 10 mg via ORAL
  Filled 2017-02-18 (×5): qty 2

## 2017-02-18 MED ORDER — OXYCODONE HCL 5 MG PO TABS
5.0000 mg | ORAL_TABLET | ORAL | Status: DC | PRN
  Administered 2017-02-18: 5 mg via ORAL
  Filled 2017-02-18: qty 1

## 2017-02-18 MED ORDER — OXYCODONE-ACETAMINOPHEN 5-325 MG PO TABS
1.0000 | ORAL_TABLET | Freq: Four times a day (QID) | ORAL | Status: DC | PRN
  Administered 2017-02-18: 1 via ORAL
  Filled 2017-02-18: qty 1

## 2017-02-18 MED ORDER — POLYSACCHARIDE IRON COMPLEX 150 MG PO CAPS
150.0000 mg | ORAL_CAPSULE | Freq: Two times a day (BID) | ORAL | Status: DC
  Administered 2017-02-18 – 2017-02-19 (×4): 150 mg via ORAL
  Filled 2017-02-18 (×4): qty 1

## 2017-02-18 NOTE — Lactation Note (Signed)
This note was copied from a baby's chart. Lactation Consultation Note  Patient Name: Alicia Rosario RUEAV'W Date: 02/18/2017  Mom is pumping 10-20 mls of colostrum each session.  Instructed to continue pumping 8-12 times/24 hours.  Encouraged to call with concerns.   Maternal Data    Feeding    LATCH Score/Interventions                      Lactation Tools Discussed/Used     Consult Status      Alicia Rosario 02/18/2017, 1:16 PM

## 2017-02-18 NOTE — Progress Notes (Signed)
Subjective: Postpartum Day #1: Cesarean Delivery Patient reports incisional pain, tolerating PO, + flatus and no problems voiding.    Objective: Vital signs in last 24 hours: Temp:  [98.1 F (36.7 C)-98.7 F (37.1 C)] 98.1 F (36.7 C) (04/25 0700) Pulse Rate:  [69-74] 69 (04/25 0700) Resp:  [18-20] 18 (04/25 0700) BP: (107-121)/(58-71) 117/71 (04/25 0700) SpO2:  [99 %-100 %] 99 % (04/24 2254)  Physical Exam:  General: alert, cooperative, no distress and tearful over infant in NICU Lochia: appropriate Uterine Fundus: firm Incision: no significant drainage, no dehiscence, no significant erythema, pressure dressing on DVT Evaluation: No evidence of DVT seen on physical exam. No cords or calf tenderness. No significant calf/ankle edema.   Recent Labs  02/16/17 2351 02/18/17 0601  HGB 12.4 9.8*  HCT 35.5* 27.7*    Assessment/Plan: Status post Cesarean section. Doing well postoperatively.  Continue current care.  Remove pressure dressing today.  Encouraged to rest and pump every 3 hours.  Lactation consult ordered.    Roe Coombs, CNM 02/18/2017, 8:01 AM

## 2017-02-19 NOTE — Clinical Social Work Maternal (Signed)
CLINICAL SOCIAL WORK MATERNAL/CHILD NOTE  Patient Details  Name: Alicia Rosario MRN: [id number] Date of Birth: Sep 09, 1981  Date:  02/19/2017  Clinical Social Worker Initiating Note:  Terri Piedra, [place] Date/ Time Initiated:  02/19/17/1300     Child's Name:  Alicia Rosario   Legal Guardian:  Other (Comment) (PArents: Alicia Rosario and Alicia Rosario)   Need for Interpreter:  None   Date of Referral:  02/18/17     Reason for Referral:  Other (Comment) (Critically ill baby in NICU)   Referral Source:  CNM   Address:  [address] [place], [place] [address]  Phone number:  [phone]   Household Members:  Minor Children, Significant Other (MOB has two daughters, Alicia Rosario/age 47 and Alicia Rosario/age 66 at home and FOB has full custody of his daughter Alicia Rosario/age 50)   Natural Supports (not living in the home):  Friends, Immediate Family, Extended Family   Professional Supports: Transport planner, Other (Comment) (Therapist (Alicia Rosario/LPC) and Psychiatrist (Alicia Rosario/PA-C) at [facility])   Employment: Full-time   Type of Work: 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  FOB works for the [employer]   Education:      Museum/gallery curator Resources:  [employer]   Other Resources:      Cultural/Religious Considerations Which May Impact Care: None stated.    Strengths:  Ability to meet basic needs , Pediatrician chosen , Home prepared for child  Designer, television/film set)   Risk Factors/Current Problems:  Mental Health Concerns    Cognitive State:  Able to Concentrate , Alert , Linear Thinking , Insightful , Goal Oriented    Mood/Affect:  Interested , Euthymic , Calm , Comfortable    CSW Assessment: CSW met with MOB in her first floor room/147 to offer support and complete assessment due to baby's admission to NICU at 39.2 weeks.  MOB was pleasant and welcoming of CSW's visit.  She introduced her visitor as her best friend and stated that we could talk about anything with her present.   MOB shared her  delivery experience and acknowledges feelings of fear related to having a c-section.  She states baby is doing "much better."  She appears pleased with baby's progress in such a short time.  She states she feels comfortable with care baby is receiving and states she feels well updated by staff.   MOB was open about her hx of mental illness and states she has been diagnosed with Bipolar II and Anxiety.  She states she has hx of many different medications for her diagnoses, but most recently was only taking Effexor, which she states works well for her.  She states she slowly weaned off of the medication when she found out she was pregnant, and wants to see how she does throughout breast feeding before resuming.  She states she has providers at [facility] and [facility] and states she has her next appointment on Mar 04, 2017.  She states she feels she is stable at this time and has the proper professional supports in place.  MOB was engaged and attentive to CSW as CSW provided education regarding signs and symptoms of PMADs.  MOB was receptive to recommendation to self-evaluate throughout the postpartum period by using the [place].  She was appreciative of information given for support groups held at [facility] acknowledges feeling "emotional" related to delivery and NICU admission, but feels she is coping well.  CSW encouraged her to allow herself to be emotional while monitoring  for concerns.  CSW encouraged her also to keep in mind "temporary and necessary" when thinking about her daughter's hospitalization.  CSW suggests that she not place expectations on baby's readiness for discharge, as this can often lead to letdown if expectations are not met.  CSW asked MOB to focus on her baby, rather than her baby's surroundings.  MOB seemed to appreciate this discussion and agreed that she is not concerned about the length of time baby will be in the hospital, but rather that baby gets  better from receiving the medical care that she needs.  CSW commends MOB for her outlook and positivity.     MOB reports that she has a great natural support system as well.  She and FOB are in a relationship and have been together approximately 2 years.  This is their first child together.  MOB reports that she has all necessary supplies for infant at home and is aware of SIDS precautions as reviewed by CSW.   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  MOB had a negative screen on 09/08/16 at her first prenatal visit.  CSW did not address hx of substance use do to duration of time since last documented use.   CSW explained ongoing support services offered to families by CSW and gave contact information.  CSW Plan/Description:  Information/Referral to [employer] , No Further Intervention Required/No Barriers to Discharge, Patient/Family Education     Alphonzo Cruise, [place] 02/19/2017, 2:58 PM

## 2017-02-19 NOTE — Progress Notes (Signed)
Pt. Made RN aware of irritated area on her right upper arm.  Noted small raised area with blisters and clear drainage.  Pt. States area is itchy.

## 2017-02-19 NOTE — Progress Notes (Signed)
CSW attempted to meet with MOB to offer support and complete assessment due to baby's NICU admission and MOB's hx of mental illness, but she was not in her room at this time.  CSW will attempt again at a later time. 

## 2017-02-19 NOTE — Progress Notes (Signed)
Subjective: Postpartum Day #2: Cesarean Delivery, PLTCS Patient reports incisional pain, tolerating PO, + flatus and no problems voiding.    Objective: Vital signs in last 24 hours: Temp:  [98 F (36.7 C)-98.1 F (36.7 C)] 98 F (36.7 C) (04/26 0628) Pulse Rate:  [69-86] 86 (04/26 0628) Resp:  [18] 18 (04/26 0628) BP: (116-123)/(70-78) 123/78 (04/26 1610)  Physical Exam:  General: alert, cooperative and no distress Lochia: appropriate Uterine Fundus: firm Incision: no significant drainage, no dehiscence, no significant erythema DVT Evaluation: No evidence of DVT seen on physical exam. No cords or calf tenderness. No significant calf/ankle edema.   Recent Labs  02/16/17 2351 02/18/17 0601  HGB 12.4 9.8*  HCT 35.5* 27.7*    Assessment/Plan: Status post Cesarean section. Doing well postoperatively.  Continue current care.  Plan discharge homoe 02/20/17.  Infant improving in NICU. Anemia: stable, on iron asymptomatic.   Roe Coombs, CNM 02/19/2017, 8:00 AM

## 2017-02-20 ENCOUNTER — Encounter: Payer: Medicaid Other | Admitting: Certified Nurse Midwife

## 2017-02-20 MED ORDER — POLYSACCHARIDE IRON COMPLEX 150 MG PO CAPS
150.0000 mg | ORAL_CAPSULE | Freq: Two times a day (BID) | ORAL | 3 refills | Status: DC
Start: 2017-02-20 — End: 2017-03-31

## 2017-02-20 MED ORDER — OXYCODONE HCL 5 MG PO TABS: 5.0000 mg | ORAL_TABLET | ORAL | 0 refills | Status: DC | PRN

## 2017-02-20 MED ORDER — IBUPROFEN 600 MG PO TABS
600.0000 mg | ORAL_TABLET | Freq: Four times a day (QID) | ORAL | 0 refills | Status: DC | PRN
Start: 2017-02-20 — End: 2017-03-31

## 2017-02-20 NOTE — Discharge Summary (Signed)
OB Discharge Summary     Patient Name: Alicia Rosario DOB: 1981/07/25 MRN: [id number]  Date of admission: 02/16/2017 Delivering MD: Adam Phenix   Date of discharge: 02/20/2017  Admitting diagnosis: 39w plug, spotting, ctx Intrauterine pregnancy: [redacted]w[redacted]d     Secondary diagnosis:  Decreased fetal movement Additional problems: iron deficiency anemia     Discharge diagnosis: Term Pregnancy Delivered, pLTCS due to NRFHT                                             Post partum procedures:none  Augmentation: none  Complications: None  Hospital course:  Induction of Labor With Cesarean Section  36 y.o. yo [phone] at [redacted]w[redacted]d was admitted to the hospital 02/16/2017 for induction of labor due to nonreassuring tracing. The patient went for cesarean section due to Non-Reassuring FHR even prior to her being in established labor, and delivered a Viable infant, Membrane Rupture Time/Date: )1:39 AM ,02/17/2017  APGARs 2/5/9. Neonate was intubated and transferred to NICU. Details of operation can be found in separate operative Note.  Patient had an uncomplicated postpartum course. She is ambulating, tolerating a regular diet, passing flatus, and urinating well.  Patient is discharged home in stable condition on 02/20/17.                                    Physical exam  Vitals:   02/18/17 1807 02/19/17 0628 02/19/17 1845 02/20/17 0520  BP: 116/70 123/78 118/68 125/72  Pulse: 69 86 75 64  Resp: Temp: 98.1 F (36.7 C) 98 F (36.7 C) 98.4 F (36.9 C) 98.6 F (37 C)  TempSrc: Oral  Oral Oral  SpO2:      Weight:      Height:       General: alert, cooperative and no distress Lochia: appropriate Uterine Fundus: firm Incision: Healing well with no significant drainage DVT Evaluation: No evidence of DVT seen on physical exam. Labs: Lab Results  Component Value Date   WBC 9.8 02/18/2017   HGB 9.8 (L) 02/18/2017   HCT 27.7 (L) 02/18/2017   MCV 73.9 (L) 02/18/2017   PLT 118 (L)  02/18/2017   CMP Latest Ref Rng & Units 09/09/2016  Glucose 65 - 99 mg/dL 67  BUN 6 - 20 mg/dL 7  Creatinine 1.47 - 8.29 mg/dL 5.62  Sodium 130 - 865 mmol/L 135  Potassium 3.5 - 5.1 mmol/L 4.2  Chloride 101 - 111 mmol/L 105  CO2 22 - 32 mmol/L 21(L)  Calcium 8.9 - 10.3 mg/dL 9.4  Total Protein 6.5 - 8.1 g/dL 7.1  Total Bilirubin 0.3 - 1.2 mg/dL 0.5  Alkaline Phos 38 - 126 U/L 46  AST 15 - 41 U/L 32  ALT 14 - 54 U/L 30    Discharge instruction: per After Visit Summary and "Baby and Me Booklet".  After visit meds:  Allergies as of 02/20/2017   No Known Allergies     Medication List    STOP taking these medications   acetaminophen 500 MG tablet Commonly known as:  TYLENOL   DICLEGIS 10-10 MG Tbec Generic drug:  Doxylamine-Pyridoxine   hydrOXYzine 25 MG capsule Commonly known as:  VISTARIL   loratadine 10 MG tablet Commonly known as:  CLARITIN   metoCLOPramide 5 MG  tablet Commonly known as:  REGLAN   pantoprazole 40 MG tablet Commonly known as:  PROTONIX     TAKE these medications   ibuprofen 600 MG tablet Commonly known as:  ADVIL,MOTRIN Take 1 tablet (600 mg total) by mouth every 6 (six) hours as needed for mild pain.   iron polysaccharides 150 MG capsule Commonly known as:  NIFEREX Take 1 capsule (150 mg total) by mouth 2 (two) times daily.   oxyCODONE 5 MG immediate release tablet Commonly known as:  Oxy IR/ROXICODONE Take 1 tablet (5 mg total) by mouth every 4 (four) hours as needed for moderate pain (for pain up to 6).   prenatal multivitamin Tabs tablet Take 1 tablet by mouth daily.       Diet: routine diet  Activity: Advance as tolerated. Pelvic rest for 6 weeks.   Outpatient follow up:6 weeks Follow up Appt:Future Appointments Date Time Provider Department Center  03/31/2017 10:00 AM Roe Coombs, CNM [facility] None   Follow up Visit:No Follow-up on file.  Postpartum contraception: IUD Paragard  Newborn Data: Live born female   Birth Weight: 4 lb 13.6 oz (2200 g) APGAR: 2, 5  Baby Feeding: Breast Disposition: NICU   02/20/2017 Durenda Hurt, MD   CNM attestation I have seen and examined this patient and agree with above documentation in the resident's note.   Alicia Rosario is a 36 y.o. [id number] s/p pLTCS.   Pain is well controlled.  Plan for birth control is IUD.  Method of Feeding: pumping  PE:  BP 125/72 (BP Location: Left Arm)   Pulse 64   Temp 98.6 F (37 C) (Oral)   Resp 18   Ht  (1.626 m)   Wt 102.6 kg (226 lb 4 oz)   LMP 05/18/2016 (Exact Date)   SpO2 99%   Breastfeeding? Unknown   BMI 38.84 kg/m  Fundus firm   Recent Labs  02/18/17 0601  HGB 9.8*  HCT 27.7*     Plan: discharge today - postpartum care discussed - f/u clinic in 4-6 weeks for postpartum visit   Alicia Rosario, Alicia Rosario, CNM 4:43 PM  02/20/2017

## 2017-02-26 ENCOUNTER — Encounter: Payer: Medicaid Other | Admitting: Certified Nurse Midwife

## 2017-03-02 ENCOUNTER — Telehealth: Payer: Self-pay | Admitting: *Deleted

## 2017-03-02 ENCOUNTER — Other Ambulatory Visit: Payer: Self-pay | Admitting: Certified Nurse Midwife

## 2017-03-02 NOTE — Telephone Encounter (Signed)
Pt made aware of recommendations to be seen in [facility] for evaluation. Pt states understanding.

## 2017-03-02 NOTE — Telephone Encounter (Signed)
Pt called to office stating she had c/s delivery and is now having daily HA and pain at her shoulder blades.  Return call to pt. Pt states that she has been taking her Ibuprofen and Oxycodone that was given for pain. Pt states that she has also been taking Tylenol.  Pt states no relief of HA with any medication. Pt states that she has also been having pain between her shoulder blades as well.  ?related to c/s. ? Pt need appt to evaluate  Please advise.

## 2017-03-02 NOTE — Telephone Encounter (Signed)
With chest pain and HA not subsided with medications needs to be evaluated at [facility].  Thank you.  R.Leodis Alcocer CNM

## 2017-03-06 ENCOUNTER — Telehealth: Payer: Self-pay | Admitting: *Deleted

## 2017-03-06 NOTE — Telephone Encounter (Signed)
Called patient- told her she could not take OCP at this time due to risk of DVT- advised Benadryl instead. Patient is addiment about stopping lactation. She also wants a MIrena when she comes for her 6 week check.

## 2017-03-06 NOTE — Telephone Encounter (Signed)
Patient is trying to decrease her breast milk production and she is having a hard time. She states her baby is in the NICU and she has a whole freezer full of milk. She has tried cabbage leaves and cold packs and nothing is working. Discussed adding birth control pills with estrogen and decreased breast simulation. Also only pumping to comfort. Patient is requesting BV and yeast treatment. She states she used a fragrance wash and has a discharge with odor and irritation. She normally gets yeast after antibiotic.

## 2017-03-31 ENCOUNTER — Encounter: Payer: Self-pay | Admitting: *Deleted

## 2017-03-31 ENCOUNTER — Other Ambulatory Visit (HOSPITAL_COMMUNITY)
Admission: RE | Admit: 2017-03-31 | Discharge: 2017-03-31 | Disposition: A | Discharge status: Home / Self Care | Payer: Medicaid Other | Source: Ambulatory Visit | Attending: Certified Nurse Midwife | Admitting: Certified Nurse Midwife

## 2017-03-31 ENCOUNTER — Ambulatory Visit (INDEPENDENT_AMBULATORY_CARE_PROVIDER_SITE_OTHER): Payer: Medicaid Other | Admitting: Certified Nurse Midwife

## 2017-03-31 ENCOUNTER — Encounter: Payer: Self-pay | Admitting: Certified Nurse Midwife

## 2017-03-31 VITALS — BP 125/86 | HR 98 | Ht 64.0 in | Wt 201.1 lb

## 2017-03-31 DIAGNOSIS — Z30014 Encounter for initial prescription of intrauterine contraceptive device: Secondary | ICD-10-CM | POA: Insufficient documentation

## 2017-03-31 DIAGNOSIS — Z30430 Encounter for insertion of intrauterine contraceptive device: Secondary | ICD-10-CM

## 2017-03-31 DIAGNOSIS — Z98891 History of uterine scar from previous surgery: Secondary | ICD-10-CM

## 2017-03-31 DIAGNOSIS — O99345 Other mental disorders complicating the puerperium: Secondary | ICD-10-CM

## 2017-03-31 DIAGNOSIS — Z9851 Tubal ligation status: Secondary | ICD-10-CM

## 2017-03-31 DIAGNOSIS — Z3202 Encounter for pregnancy test, result negative: Secondary | ICD-10-CM

## 2017-03-31 DIAGNOSIS — F53 Postpartum depression: Secondary | ICD-10-CM

## 2017-03-31 LAB — POCT URINE PREGNANCY: PREG TEST UR: NEGATIVE

## 2017-03-31 MED ORDER — LEVONORGESTREL 20 MCG/24HR IU IUD
INTRAUTERINE_SYSTEM | Freq: Once | INTRAUTERINE | Status: AC
  Administered 2017-03-31: 12:00:00 via INTRAUTERINE

## 2017-03-31 MED ORDER — SERTRALINE HCL 50 MG PO TABS: 50.0000 mg | ORAL_TABLET | Freq: Every day | ORAL | 2 refills | Status: DC

## 2017-03-31 NOTE — Progress Notes (Signed)
[facility].  Marland Kitchen..Post Partum Exam  Alicia Rosario is a 36 y.o. [phone] [id number] female who presents for a postpartum visit. She is 6 weeks postpartum following a low cervical transverse Cesarean section. I have fully reviewed the prenatal and intrapartum course. The delivery was at 39.2 gestational weeks.  Anesthesia: spinal. Postpartum course has been complicated by postpartum depression. Baby's course has been normal since NICU discharge home. Baby is feeding by bottle - Similac Advance. Bleeding no bleeding. Bowel function is normal. Bladder function is normal. Patient is not sexually active. Contraception method is IUD. Postpartum depression screening:positive  The following portions of the patient's history were reviewed and updated as appropriate: allergies, current medications, past family history, past medical history, past social history, past surgical history and problem list.  Review of Systems Pertinent items noted in HPI and remainder of comprehensive ROS otherwise negative.    Objective:  unknown if currently breastfeeding.  General:  alert, cooperative and no distress   Breasts:  inspection negative, no nipple discharge or bleeding, no masses or nodularity palpable  Lungs: clear to auscultation bilaterally  Heart:  regular rate and rhythm, S1, S2 normal, no murmur, click, rub or gallop  Abdomen: soft, non-tender; bowel sounds normal; no masses,  no organomegaly   Vulva:  normal  Vagina: vagina positive for menses  Cervix:  no bleeding following Pap  Corpus: normal size, contour, position, consistency, mobility, non-tender  Adnexa:  not evaluated  Rectal Exam: Not performed.        Assessment:    Normal 6 week postpartum exam. Pap smear done at today's visit.   postpartum depression   Plan:   1. Contraception: IUD placed today 2.  BH referral made, Zoloft started.   3. Follow up in: 1 month for sting check or as needed.

## 2017-03-31 NOTE — Progress Notes (Signed)
IUD Procedure Note   DIAGNOSIS: Desires long-term, reversible contraception   PROCEDURE: IUD placement Performing Provider: Orvilla Cornwallachelle Jakarius Flamenco CNM  Patient counseled prior to procedure. I explained risks and benefits of Mirena IUD, reviewed alternative forms of contraception. Patient stated understanding and consented to continue with procedure.   LMP: 03/30/17 Pregnancy Test: Negative Lot #: [id number] Expiration Date: Nov 2020   IUD type: [X]  Mirena   [  ] Paragard  [  ] Candise CheLyletta   [  ]  Kyleena  PROCEDURE:  Timeout procedure was performed to ensure right patient and right site.  A bimanual exam was performed to determine the position of the uterus, retroverted. The speculum was placed. The vagina and cervix was sterilized in the usual manner and sterile technique was maintained throughout the course of the procedure. A single toothed tenaculum was applied to the posterior lip of the cervix and gentle traction applied. The depth of the uterus was sounded to 10 cm. With gentle traction on the tenaculum, the IUD was inserted to the appropriate depth and inserted without difficulty.  The string was cut to an estimated 4 cm length. Bleeding was minimal. The patient tolerated the procedure well.   Follow up: The patient tolerated the procedure well without complications.  Standard post-procedure care is explained and return precautions are given.  Orvilla Cornwallachelle Hayde Kilgour CNM

## 2017-04-02 LAB — CYTOLOGY - PAP
Diagnosis: NEGATIVE
HPV (WINDOPATH): NOT DETECTED

## 2017-04-03 ENCOUNTER — Other Ambulatory Visit: Payer: Self-pay | Admitting: Certified Nurse Midwife

## 2017-04-03 NOTE — Addendum Note (Signed)
Addendum  created 04/03/17 1201 by Leilani AbleHatchett, Fitzroy Mikami, MD   Sign clinical note

## 2017-04-09 ENCOUNTER — Institutional Professional Consult (permissible substitution): Payer: Medicaid Other

## 2017-05-04 ENCOUNTER — Ambulatory Visit (INDEPENDENT_AMBULATORY_CARE_PROVIDER_SITE_OTHER): Payer: Medicaid Other | Admitting: Certified Nurse Midwife

## 2017-05-04 ENCOUNTER — Encounter: Payer: Self-pay | Admitting: Certified Nurse Midwife

## 2017-05-04 VITALS — BP 125/82 | HR 108 | Ht 64.0 in | Wt 208.6 lb

## 2017-05-04 DIAGNOSIS — Z308 Encounter for other contraceptive management: Secondary | ICD-10-CM | POA: Diagnosis not present

## 2017-05-04 DIAGNOSIS — Z30431 Encounter for routine checking of intrauterine contraceptive device: Secondary | ICD-10-CM

## 2017-05-04 NOTE — Progress Notes (Signed)
Subjective:    Alicia Rosario  who presents for contraception counseling. The patient has no complaints today. The patient is sexually active. Pertinent past medical history: none.  Likes her Mirena IUD.  Is currently spotting/cramping with the IUD.  Likely spider bite 4 days ago, has tried apple cider vinegar, OTC lotramin, etc.   No relief, reports itching at site.    The information documented in the HPI was reviewed and verified.  Menstrual History: OB History    Gravida Para Term Preterm AB Living   1 1 1     1    SAB TAB Ectopic Multiple Live Births         0 1      No LMP recorded. has Mirnea IUD   Patient Active Problem List   Diagnosis Date Noted   Patient Active Problem List   Diagnosis Date Noted  . History of bilateral tubal ligation 03/31/2017  . 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 Supervision of other normal pregnancy, antepartum 10/06/2016  . 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 Bipolar II disorder (HCC) 08/02/2014  . GAD (generalized anxiety disorder) 08/02/2014  . Mood disorder (HCC) 06/26/2014  . IBS (irritable bowel syndrome) 04/19/2014  . 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 MDD (major depressive disorder) 04/19/2014  . Carpal tunnel syndrome 02/22/2014    No past medical history on file.  Past Surgical History:  Procedure Laterality Date   Past Surgical History:  Procedure Laterality Date  . CESAREAN SECTION N/A 02/17/2017   Procedure: CESAREAN SECTION;  Surgeon: Adam Phenix, MD;  Location: [facility] BIRTHING SUITES;  Service: Obstetrics;  Laterality: N/A;  . CHOLECYSTECTOMY    . MOUTH SURGERY        Current Outpatient Prescriptions:  No medication comments found. No current outpatient prescriptions on file prior to visit.   No current facility-administered medications on file prior to visit.     Allergies  Allergen Reactions  No Known Allergies   Social History  Substance Use Topics   Social History   Social History  . Marital status:  Single    Spouse name: N/A  . Number of children: N/A  . Years of education: N/A   Occupational History  . Not on file.   Social History Main Topics  . Smoking status: Former Games developer  . Smokeless tobacco: Never Used  . Alcohol use No     Comment: social  . Drug use: No  . Sexual activity: Yes    Birth control/ protection: IUD   Other Topics Concern  . Not on file   Social History Narrative  . No narrative on file     No family history on file.     Review of Systems Constitutional: negative for weight loss Genitourinary:negative for abnormal menstrual periods and vaginal discharge   Objective:   BP 115/77   Pulse 89   Wt 161 lb (73 kg)   BMI 25.22 kg/m    General:   alert  Skin:   no rash or abnormalities  Lungs:   clear to auscultation bilaterally  Heart:   regular rate and rhythm, S1, S2 normal, no murmur, click, rub or gallop  Breasts:   deferred  Abdomen:  normal findings: no organomegaly, soft, non-tender and no hernia  Pelvis:  External genitalia: normal general appearance Urinary system: urethral meatus normal and bladder without fullness, nontender Vaginal: normal without tenderness, induration or masses Cervix: normal appearance, IUD strings present Adnexa: normal bimanual exam Uterus: anteverted and  non-tender, normal size   Lab Review Urine pregnancy test Labs reviewed yes Radiologic studies reviewed no  50% of 15 min visit spent on counseling and coordination of care.    Assessment:    36 y.o., continuing IUD, no contraindications.   Has probable spider bite on left forearm started 4 days ago.  OTC antibiotic ointment and benadryl to the site.   Plan:    All questions answered.  No orders of the defined types were placed in this encounter.  No orders of the defined types were placed in this encounter.  Need to obtain previous records Follow up as needed or in 1 year for annual exam.

## 2017-05-04 NOTE — Progress Notes (Signed)
Pt complains of having bleeding since IUD insert. Also states that she has really bad cramps.

## 2017-05-07 ENCOUNTER — Ambulatory Visit (HOSPITAL_COMMUNITY)
Admission: EM | Admit: 2017-05-07 | Discharge: 2017-05-07 | Disposition: A | Discharge status: Home / Self Care | Payer: Medicaid Other | Attending: Emergency Medicine | Admitting: Emergency Medicine

## 2017-05-07 ENCOUNTER — Encounter (HOSPITAL_COMMUNITY): Payer: Self-pay | Admitting: Emergency Medicine

## 2017-05-07 DIAGNOSIS — R21 Rash and other nonspecific skin eruption: Secondary | ICD-10-CM | POA: Insufficient documentation

## 2017-05-07 DIAGNOSIS — Z87891 Personal history of nicotine dependence: Secondary | ICD-10-CM | POA: Insufficient documentation

## 2017-05-07 DIAGNOSIS — K589 Irritable bowel syndrome without diarrhea: Secondary | ICD-10-CM | POA: Diagnosis not present

## 2017-05-07 DIAGNOSIS — Z9889 Other specified postprocedural states: Secondary | ICD-10-CM | POA: Insufficient documentation

## 2017-05-07 DIAGNOSIS — Z9049 Acquired absence of other specified parts of digestive tract: Secondary | ICD-10-CM | POA: Insufficient documentation

## 2017-05-07 DIAGNOSIS — F319 Bipolar disorder, unspecified: Secondary | ICD-10-CM | POA: Insufficient documentation

## 2017-05-07 DIAGNOSIS — Z803 Family history of malignant neoplasm of breast: Secondary | ICD-10-CM | POA: Insufficient documentation

## 2017-05-07 DIAGNOSIS — I10 Essential (primary) hypertension: Secondary | ICD-10-CM | POA: Insufficient documentation

## 2017-05-07 DIAGNOSIS — Z818 Family history of other mental and behavioral disorders: Secondary | ICD-10-CM | POA: Insufficient documentation

## 2017-05-07 MED ORDER — MUPIROCIN 2 % EX OINT: 1.0000 "application " | TOPICAL_OINTMENT | Freq: Three times a day (TID) | CUTANEOUS | 0 refills | Status: DC

## 2017-05-07 NOTE — ED Triage Notes (Signed)
Pt woke up on Saturday with small white blisters on her left forearm.  She treated it with Lamisil and it turned into a full blister on Sunday.  Pt has been using alcohol and neosporin on it since the blister opened.  Pt has a round open wound to the forearm.

## 2017-05-07 NOTE — ED Provider Notes (Signed)
HPI  SUBJECTIVE:  Alicia Rosario is a 36 y.o. female who presents with an itchy area in the left forearm starting 5 days ago. States it started off as 2 pimples or like a spider bite. She states that it blistered up 4 days ago and brings in a picture at that time. She saw a provider 3 days ago for a routine postpartum visit, was started on triple antibiotic ointment. She reports stinging, burning pain , purulent drainage starting yesterday. She does not recall any trauma or known insect bite. No tick bite. No fevers, bodyaches. No antibiotics in the past month. No antipyretic in the past 6-8 hours. 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 No crusting. Exposure to poison ivy or poison oak. She notes swelling, erythema, itching around the lesion that she attributes to the adhesive in the band aids that she was using. States that this has markedly improved since she started wrapping it in gauze and an Ace wrap. She has tried Lamisil, triple antibiotic ointment, peroxide, Tylenol, ibuprofen. She has been covering this with a kidney gauze and Ace wrap. There are no aggravating or alleviating factors. She has a past medical history of bipolar disease, chickenpox, hypertension. No history of diabetes, MRSA, herpes. LMP: Mirena. Denies possibility pregnant. She is 3 months postpartum. She is not breast-feeding. PMD: None.    Past Medical History:  Diagnosis Date  . Bipolar II disorder (HCC)   . Depression   . Hypertension   . Irritable bowel syndrome (IBS)     Past Surgical History:  Procedure Laterality Date  . CESAREAN SECTION N/A 02/17/2017   Procedure: CESAREAN SECTION;  Surgeon: Adam Phenix, MD;  Location: [facility] BIRTHING SUITES;  Service: Obstetrics;  Laterality: N/A;  . CHOLECYSTECTOMY    . MOUTH SURGERY      Family History  Problem Relation Age of Onset  . Mental illness Mother   . Hyperlipidemia Father   . Cancer Paternal Grandmother        Breast CA    Social History    Substance Use Topics  . Smoking status: Former Games developer  . Smokeless tobacco: Never Used  . Alcohol use No     Comment: social    No current facility-administered medications for this encounter.   Current Outpatient Prescriptions:  .  venlafaxine XR (EFFEXOR XR) 150 MG 24 hr capsule, Take 150 mg by mouth daily with breakfast., Disp: , Rfl:  .  mupirocin ointment (BACTROBAN) 2 %, Apply 1 application topically 3 (three) times daily., Disp: 22 g, Rfl: 0  No Known Allergies   ROS  As noted in HPI.   Physical Exam  BP (!) 124/92 (BP Location: Right Arm)   Pulse 95   Temp 98.4 F (36.9 C) (Oral)   SpO2 100%   Breastfeeding? No   Constitutional: Well developed, well nourished, no acute distress Eyes:  EOMI, conjunctiva normal bilaterally HENT: Normocephalic, atraumatic,mucus membranes moist Respiratory: Normal inspiratory effort Cardiovascular: Normal rate GI: nondistended skin:  1 x 1 cm round lesion with dermis exposed with surrounding nontender vesicular papules. See pictures. First picture is from patient's phone several days ago      Musculoskeletal: no deformities Neurologic: Alert & oriented x 3, no focal neuro deficits Psychiatric: Speech and behavior appropriate   ED Course   Medications - No data to display  Orders Placed This Encounter  Procedures  . Hsv Culture And Typing    Left forearm    Standing Status:  Standing    Number of Occurrences:   1    Order Specific Question:   Patient immune status    Answer:   Normal    No results found for this or any previous visit (from the past 24 hour(s)). No results found.  ED Clinical Impression  Rash and nonspecific skin eruption   ED Assessment/Plan  Unroofed one of the lesions with a sterile 18-gauge needle, HSV cultures sent. Area was dressed with bacitracin, gauze and Kerlix. Will initiate antivirals based on these cultures. Advised bacitracin, not Neosporin, will send home with Bactroban. Do  not see any need for oral antibiotics at this time.  Presentation suggestive of a herpetic lesion especially with the appearance of the first picture. Also the differential is a staph or strep infection. Doubt ringworm or contact dermatitis. We'll provide a primary care referral or she may return here if not getting better in 5 days to a week. The ER if she gets worse.  Discussed labs,  MDM, plan and followup with patient. Discussed sn/sx that should prompt return to the ED. Patient agrees with plan.   Meds ordered this encounter  Medications  . mupirocin ointment (BACTROBAN) 2 %    Sig: Apply 1 application topically 3 (three) times daily.    Dispense:  22 g    Refill:  0    *This clinic note was created using Scientist, clinical (histocompatibility and immunogenetics)Dragon dictation software. Therefore, there may be occasional mistakes despite careful proofreading.  ?   Domenick GongMortenson, Makenize Messman, MD 05/07/17 1500

## 2017-05-07 NOTE — Discharge Instructions (Signed)
Use bacitracin, not Neosporin. Also used bacitracin. Continue keeping covered with a nonstick dressing, with Kerlix or an Ace wrap around it. Follow-up with one of the primary care physicians listed below or return here if not getting better in 5 days to a week  Below is a list of primary care practices who are taking new patients for you to follow-up with. [facility] [address] [place], [place] [address] [phone]  [facility] [phone] [address] [place] [address]  [facility]: [address] [place] [id number]  [phone]  [facility]: [address] [place] [address]   [phone]  [facility]: [address] [place] [address]  [phone]  [facility] : [address] [place] [address] [phone]  [facility]: [address] [place] [phone]  [facility]: [address] [place] [address] [phone]  Dr. Oneal GroutMahima Pandey [address] [facility] [place] [address]  [phone]  Dr. Jackie PlumGeorge Osei-Bonsu, [facility]. [address] [place], [place] [address]  [phone]  Go to [email or web address].com to look up your medications. This will give you a list of where you can find your prescriptions at the most affordable prices. Or ask the pharmacist what the cash price is. This can be less expensive than what you would pay with insurance.

## 2017-05-09 IMAGING — US US MFM OB DETAIL+14 WK
1 series · 13 of 28 positions shown · non-contrast
Comparison: none

[Series 1: us mfm ob detail+14 wk · 56 acquisitions, 13 frames shown]
[im 3/56]
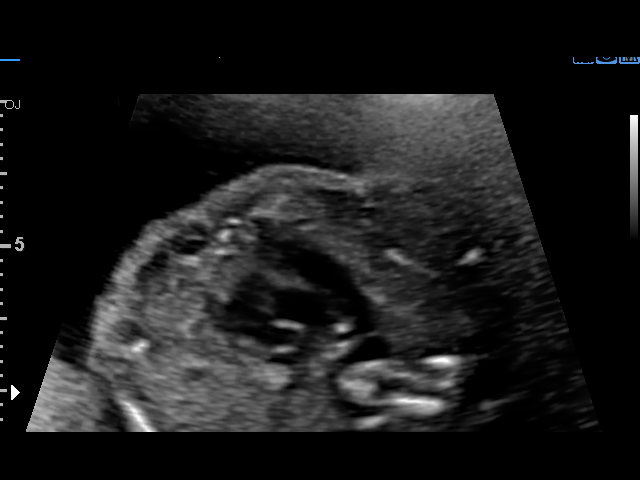
[im 7/56]
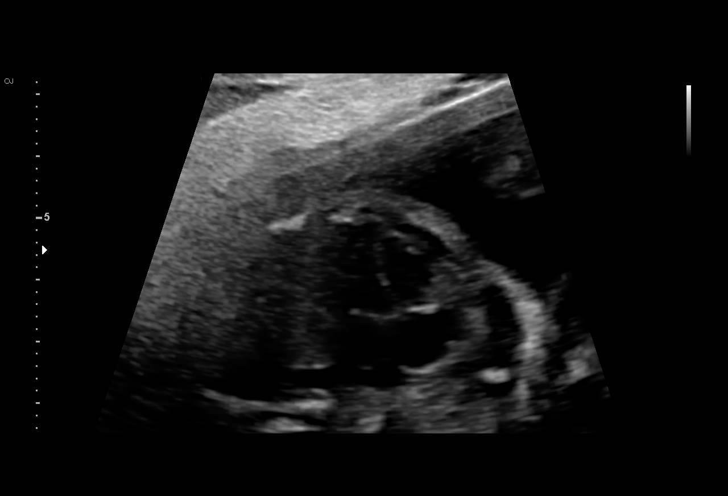
[im 11/56]
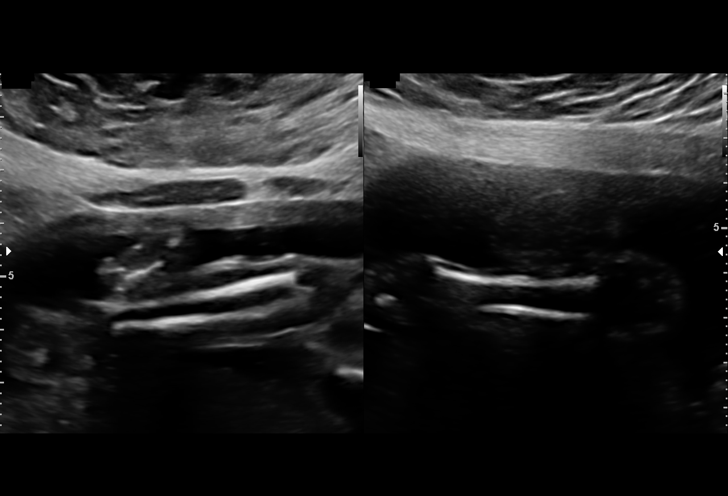
[im 15/56]
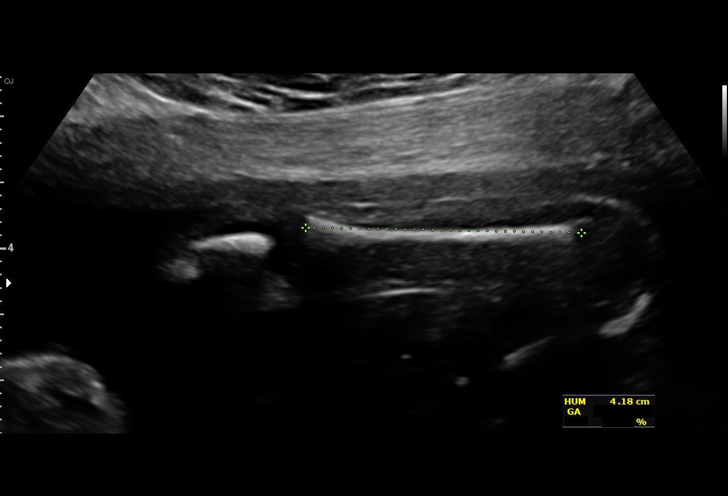
[im 19/56]
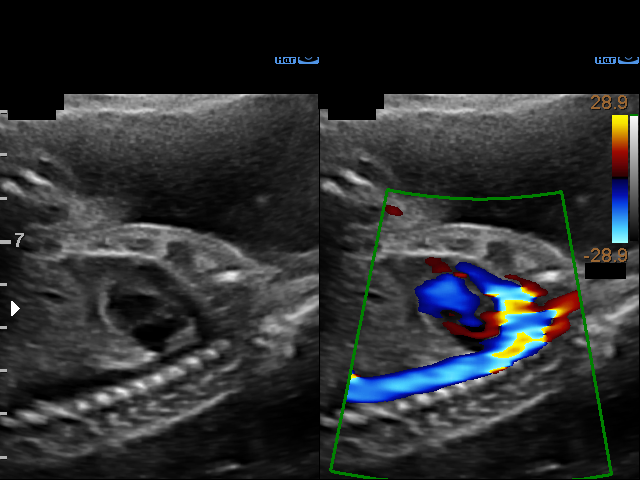
[im 23/56]
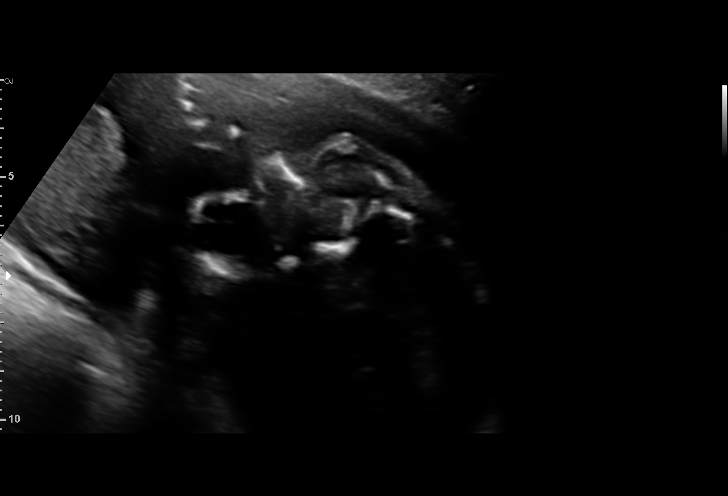
[im 29/56]
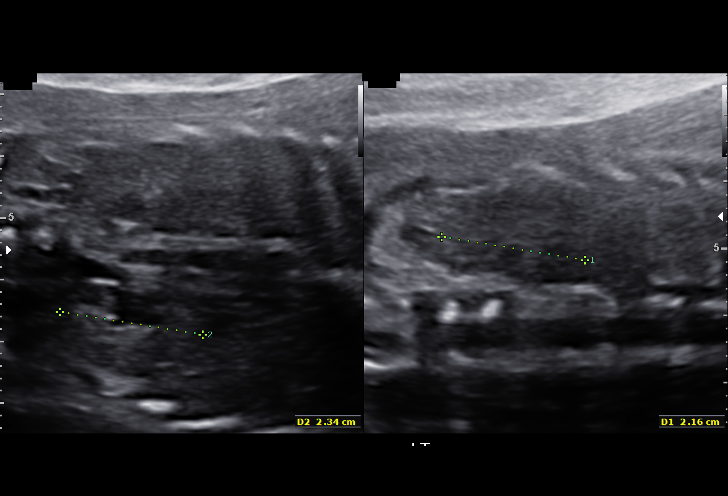
[im 33/56]
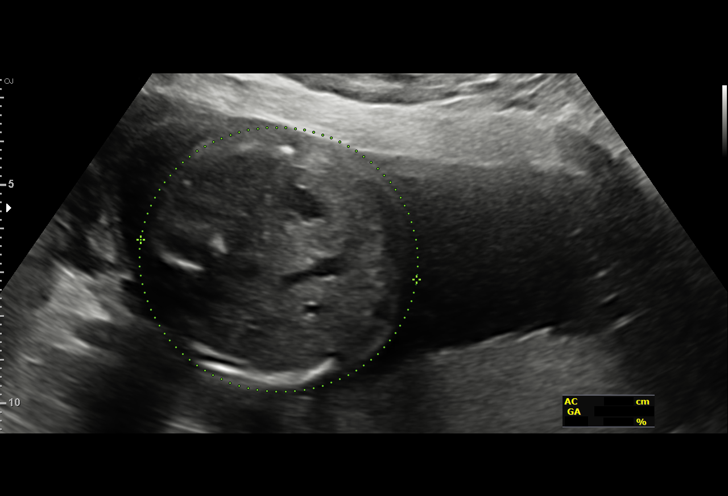
[im 37/56]
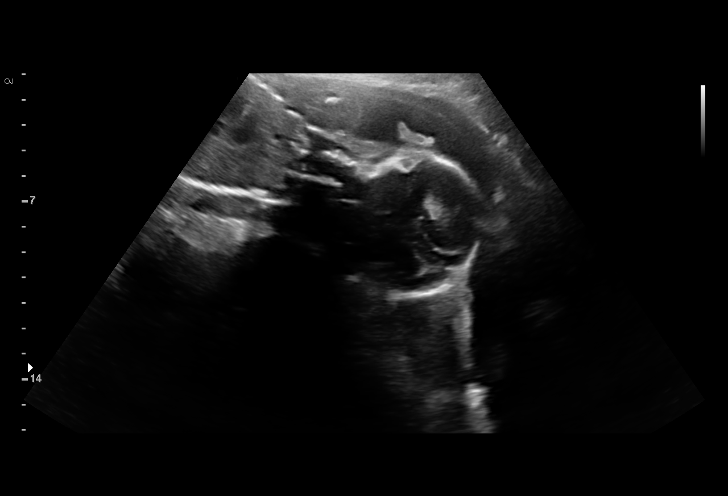
[im 41/56]
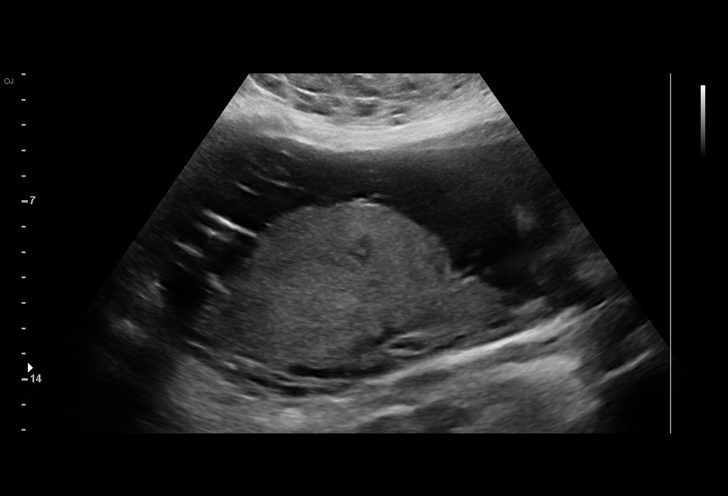
[im 45/56]
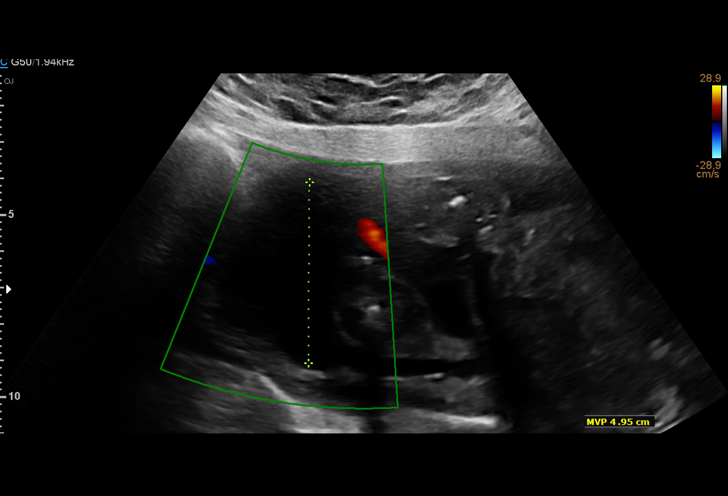
[im 49/56]
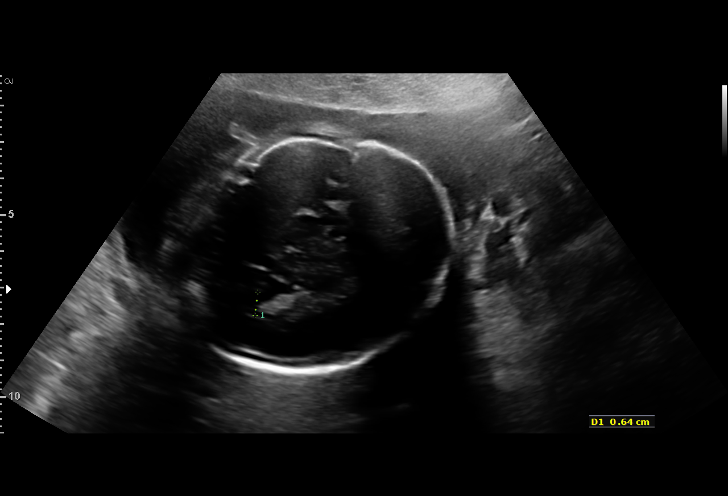
[im 53/56]
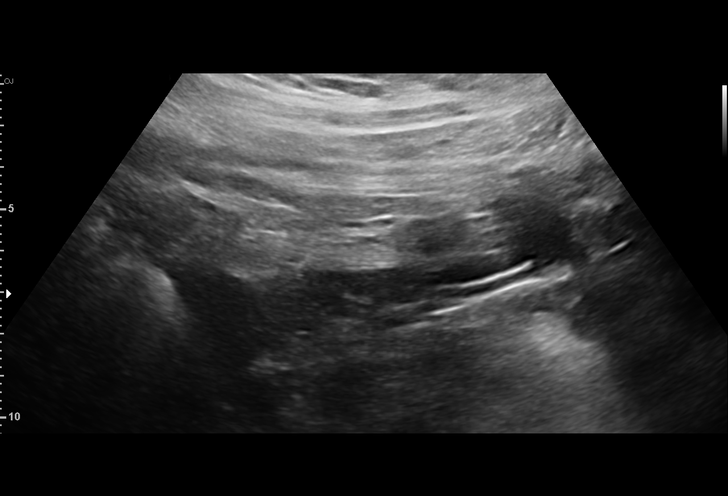

[13 of 28 positions shown; findings below may reference images not displayed]

1  MEHLULI WYK           [id number]      [id number]     [id number]
Indications

25 weeks gestation of pregnancy
Encounter for antenatal screening for
malformations
Medical complication of pregnancy
(hemoglobin C)
Obesity complicating pregnancy, second
trimester
OB History

Blood Type:            Height:  5'4"   Weight (lb):  215      BMI:
Gravidity:    5         Term:   2        Prem:   0        SAB:   2
TOP:          0       Ectopic:  0        Living: 2
Fetal Evaluation

Num Of Fetuses:     1
Fetal Heart         157
Rate(bpm):
Cardiac Activity:   Observed
Presentation:       Cephalic
Placenta:           Posterior, above cervical os
P. Cord Insertion:  Not well visualized

Amniotic Fluid
AFI FV:      Subjectively within normal limits

Largest Pocket(cm)
4.95
Biometry
BPD:      59.2  mm     G. Age:  24w 1d         13  %    CI:        73.44   %   70 - 86
FL/HC:      20.4   %   18.7 -
HC:      219.5  mm     G. Age:  24w 0d          5  %    HC/AC:      1.11       1.04 -
AC:      197.8  mm     G. Age:  24w 3d         22  %    FL/BPD:     75.7   %   71 - 87
FL:       44.8  mm     G. Age:  24w 6d         26  %    FL/AC:      22.6   %   20 - 24
HUM:      41.4  mm     G. Age:  25w 0d         40  %

Est. FW:     704  gm      1 lb 9 oz     38  %
Gestational Age

LMP:           25w 1d       Date:   05/18/16                 EDD:   02/22/17
U/S Today:     24w 3d                                        EDD:   02/27/17
Best:          25w 1d    Det. By:   LMP  (05/18/16)          EDD:   02/22/17
Anatomy

Cranium:               Appears normal         Aortic Arch:            Appears normal
Cavum:                 Not well visualized    Ductal Arch:            Appears normal
Ventricles:            Appears normal         Diaphragm:              Appears normal
Choroid Plexus:        Not well visualized    Stomach:                Appears normal, left
sided
Cerebellum:            Not well visualized    Abdomen:                Appears normal
Posterior Fossa:       Not well visualized    Abdominal Wall:         Appears nml (cord
insert, abd wall)
Nuchal Fold:           Not applicable (>20    Cord Vessels:           Appears normal (3
wks GA)                                        vessel cord)
Face:                  Appears normal         Kidneys:                Appear normal
(orbits and profile)
Lips:                  Appears normal         Bladder:                Appears normal
Thoracic:              Appears normal         Spine:                  Not well visualized
Heart:                 Not well visualized    Upper Extremities:      Appears normal
RVOT:                  Appears normal         Lower Extremities:      Appears normal
LVOT:                  Appears normal

Other:  Fetus appears to be a female. Technically difficult due to maternal
habitus and fetal position.
Cervix Uterus Adnexa

Cervix
Length:           3.26  cm.
Normal appearance by transabdominal scan.

Uterus
Multiple fibroids noted, see table below.

Left Ovary
Not visualized. No adnexal mass visualized.

Right Ovary
Not visualized. No adnexal mass visualized.
Myomas

Site                     L(cm)      W(cm)      D(cm)      Location
Anterior                 2.6        1.7        2
Posterior
Blood Flow                 RI        PI       Comments

Impression

Singleton intrauterine pregnancy at 25+1 weeks. Here for
anatomic survey. Patient has hepatits C
Review of the anatomy shows no sonographic markers for
aneuploidy or structural anomalies
However, intracranial and cardiac evaluations should be
considered suboptimal secondary to maternal body habitus
and fetal position
Amniotic fluid volume is normal
Estimated fetal weight is 704g which is growth in the 38th
percentile
Recommendations

Recommend repeat scan in 4 weeks to attempt completion of
anatomic survey

## 2017-05-10 LAB — HSV CULTURE AND TYPING

## 2017-11-05 ENCOUNTER — Ambulatory Visit (HOSPITAL_COMMUNITY)
Admission: EM | Admit: 2017-11-05 | Discharge: 2017-11-05 | Disposition: A | Discharge status: Home / Self Care | Payer: BLUE CROSS/BLUE SHIELD | Attending: Family Medicine | Admitting: Family Medicine

## 2017-11-05 ENCOUNTER — Other Ambulatory Visit: Payer: Self-pay

## 2017-11-05 ENCOUNTER — Encounter (HOSPITAL_COMMUNITY): Payer: Self-pay | Admitting: Emergency Medicine

## 2017-11-05 DIAGNOSIS — F419 Anxiety disorder, unspecified: Secondary | ICD-10-CM

## 2017-11-05 DIAGNOSIS — R0789 Other chest pain: Secondary | ICD-10-CM

## 2017-11-05 MED ORDER — FLUOXETINE HCL 10 MG PO TABS: 10.0000 mg | ORAL_TABLET | Freq: Every day | ORAL | 1 refills | Status: DC

## 2017-11-05 MED ORDER — LORAZEPAM 1 MG PO TABS: 1.0000 mg | ORAL_TABLET | Freq: Three times a day (TID) | ORAL | 0 refills | Status: DC

## 2017-11-05 NOTE — ED Triage Notes (Addendum)
Chest discomfort started yesterday, left chest and back.  Pain is now across chest , squeeze.  No nausea, no vomiting, slight, intermittent sob.     Patient has had some stressful events lately.  Her daughters father died.  Patient has several children, working long hours.  Patient weaned off Effexor over the past month, insurance changes, trying to get into psychiatrist with no luck.

## 2017-11-11 NOTE — ED Provider Notes (Signed)
[facility]   [id number] 11/05/17 Arrival Time: 1804  ASSESSMENT & PLAN:  1. Anxiety   2. Atypical chest pain     Meds ordered this encounter  Medications  . FLUoxetine (PROZAC) 10 MG tablet    Sig: Take 1 tablet (10 mg total) by mouth daily.    Dispense:  30 tablet    Refill:  1  . LORazepam (ATIVAN) 1 MG tablet    Sig: Take 1 tablet (1 mg total) by mouth every 8 (eight) hours.    Dispense:  15 tablet    Refill:  0   Chest discomfort very likely related to anxiety. She would like to begin another medication. Medication sedation precautions given. Will schedule prompt f/u with her PCP.  Chest pain precautions given. Reviewed expectations re: course of current medical issues. Questions answered. Outlined signs and symptoms indicating need for more acute intervention. Patient verbalized understanding. After Visit Summary given.   SUBJECTIVE:  Alicia Rosario is a 37 y.o. female who presents with complaint of:  Chest Discomfort: Onset abrupt, 1 day ago, with stable course since that time; on and off symptoms. Describes discomfort as "a tight feeling" and dull in nature. Discomfort does not radiate. Rates discomfort as a 1/10 in intensity. Associated symptoms: no SOB/n/v/diaphoresis Aggravating factors: emotional stress. Alleviating factors: none. Usually resolves gradually after a few minutes.  She does report stressful events lately and questions if these are related to her current symptoms. Mild panic attacks in the past. Her daughters father died. Patient has several children, working long hours. Patient weaned off Effexor over the past month, insurance changes, trying to get into psychiatrist with no luck.  OTC treatment: None.  ROS: As per HPI.   OBJECTIVE:  Vitals:   11/05/17 1900  BP: 138/87  Pulse: 71  Resp: (!) 22  Temp: 98.3 F (36.8 C)  TempSrc: Oral  SpO2: 98%    Recheck RR: 16  General appearance: alert; no distress; appears  slightly anxious and admits feeling anxious Eyes: PERRLA; EOMI; conjunctiva normal HENT: normocephalic; atraumatic Neck: supple Lungs: clear to auscultation bilaterally Heart: regular rate and rhythm Chest Wall: nontender Abdomen: soft, non-tender; bowel sounds normal; no masses or organomegaly; no guarding or rebound tenderness Extremities: no cyanosis or edema; symmetrical with no gross deformities Skin: warm and dry Psychological: alert and cooperative; normal mood and affect   No Known Allergies  Past Medical History:  Diagnosis Date  . Bipolar II disorder (HCC)   . Depression   . Hypertension   . Irritable bowel syndrome (IBS)    Social History   Socioeconomic History  . Marital status: Single    Spouse name: Not on file  . Number of children: Not on file  . Years of education: Not on file  . Highest education level: Not on file  Social Needs  . Financial resource strain: Not on file  . Food insecurity - worry: Not on file  . Food insecurity - inability: Not on file  . Transportation needs - medical: Not on file  . Transportation needs - non-medical: Not on file  Occupational History  . Not on file  Tobacco Use  . Smoking status: Former Games developermoker  . Smokeless tobacco: Never Used  Substance and Sexual Activity  . Alcohol use: No    Alcohol/week: 1.5 oz    Types: 3 Standard drinks or equivalent per week    Comment: social  . Drug use: No  . Sexual activity: Yes  Birth control/protection: IUD  Other Topics Concern  . Not on file  Social History Narrative  . Not on file   Family History  Problem Relation Age of Onset  . Mental illness Mother   . Hyperlipidemia Father   . Cancer Paternal Grandmother        Breast CA   Past Surgical History:  Procedure Laterality Date  . CESAREAN SECTION N/A 02/17/2017   Procedure: CESAREAN SECTION;  Surgeon: Adam Phenix, MD;  Location: [facility] BIRTHING SUITES;  Service: Obstetrics;  Laterality: N/A;  . CHOLECYSTECTOMY      . MOUTH SURGERY       Mardella Layman, MD 11/11/17 [phone]

## 2018-03-30 ENCOUNTER — Encounter (HOSPITAL_COMMUNITY): Payer: Self-pay | Admitting: Emergency Medicine

## 2018-03-30 ENCOUNTER — Other Ambulatory Visit: Payer: Self-pay

## 2018-03-30 ENCOUNTER — Ambulatory Visit (HOSPITAL_COMMUNITY)
Admission: EM | Admit: 2018-03-30 | Discharge: 2018-03-30 | Disposition: A | Discharge status: Home / Self Care | Payer: BLUE CROSS/BLUE SHIELD | Attending: Emergency Medicine | Admitting: Emergency Medicine

## 2018-03-30 DIAGNOSIS — J302 Other seasonal allergic rhinitis: Secondary | ICD-10-CM | POA: Diagnosis not present

## 2018-03-30 DIAGNOSIS — R200 Anesthesia of skin: Secondary | ICD-10-CM

## 2018-03-30 DIAGNOSIS — R202 Paresthesia of skin: Secondary | ICD-10-CM

## 2018-03-30 MED ORDER — PREDNISONE 50 MG PO TABS: 50.0000 mg | ORAL_TABLET | Freq: Every day | ORAL | 0 refills | Status: AC

## 2018-03-30 MED ORDER — AMOXICILLIN 875 MG PO TABS: 875.0000 mg | ORAL_TABLET | Freq: Two times a day (BID) | ORAL | 0 refills | Status: AC

## 2018-03-30 MED ORDER — IPRATROPIUM BROMIDE 0.06 % NA SOLN: 2.0000 | Freq: Four times a day (QID) | NASAL | 0 refills | Status: DC

## 2018-03-30 MED ORDER — MONTELUKAST SODIUM 10 MG PO TABS: 10.0000 mg | ORAL_TABLET | Freq: Every day | ORAL | 0 refills | Status: DC

## 2018-03-30 MED ORDER — FLUTICASONE PROPIONATE 50 MCG/ACT NA SUSP: 2.0000 | Freq: Every day | NASAL | 0 refills | Status: DC

## 2018-03-30 NOTE — ED Triage Notes (Signed)
[facility] a [facility] pain and a sore throat that started today.

## 2018-03-30 NOTE — Discharge Instructions (Addendum)
Symptoms can be due to allergies. As discussed, both ear membrane are red, and this can be due to pressure vs bacterial infection given 1 day history. Start flonase, atrovent nasal spray for nasal congestion/drainage. You can use over the counter nasal saline rinse such as neti pot for nasal congestion. Keep hydrated, your urine should be clear to pale yellow in color. Tylenol/motrin for fever and pain. Monitor for any worsening of symptoms, chest pain, shortness of breath, wheezing, swelling of the throat, follow up for reevaluation.   Prescription of singulair if continues to have allergy symptoms, this is to continue with your allergy medicine and nasal sprays. If ear pain continues, you can fill amoxicillin for ear infection.  Right hand numbness/tingling can be due to inflammation causing irritation to the nerve. Start prednisone as directed. Ice compress, wrist splint to prevent further wrist movement. Follow up with PCP/orthopedics for further evaluation if symptoms not improving. If experiencing weak grip, unable to move fingers, cold/clammy fingers, follow up for reevaluation.

## 2018-03-30 NOTE — ED Provider Notes (Signed)
[facility]    CSN: [id number] Arrival date & time: 03/30/18  1830     History   Chief Complaint Chief Complaint  Patient presents with  . Otalgia  . Sore Throat    HPI Alicia Rosario is a 37 y.o. female.   37 year old female comes in with multiple complaints.  1 day history of sore throat and bilateral ear pain.  States has also had mild productive cough, rhinorrhea, nasal congestion.  States she does have seasonal allergies, and has been on Allegra.  She continues to have sneezing.  States sore throat has since worsened, and now with painful swallowing.  Denies trouble breathing, swelling of the throat, drooling, tripoding, trismus.  States feels like both ears are underwater, with some fullness and pain.  Denies fever, chills, night sweats.  Has not tried anything else for the symptoms.  Patient has 1 month history of intermittent right hand numbness and tingling that worsened yesterday.  States the numbness and tingling is generalized throughout the whole hand and intermittent.  States feels like there is a rubber band around her wrist causing her whole hand to fall asleep.  Denies pain, states more "pins-and-needles feeling" states hand gripping makes symptoms worse, no obvious alleviating factor.  States she noticed some discoloration to the palmar aspect of the right hand yesterday.  Denies injury/trauma.  Work does require positive repetitive motion.  States she had similar symptoms in the past that resolved on own.  States during that process, had testing for carpal tunnel done and was told she did not have carpal tunnel.  She has not tried any  medications.     Past Medical History:  Diagnosis Date  . Bipolar II disorder (HCC)   . Depression   . Hypertension   . Irritable bowel syndrome (IBS)     Patient Active Problem List   Diagnosis Date Noted  . History of bilateral tubal ligation 03/31/2017  . 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 Supervision of  other normal pregnancy, antepartum 10/06/2016  . 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 Bipolar II disorder (HCC) 08/02/2014  . GAD (generalized anxiety disorder) 08/02/2014  . Mood disorder (HCC) 06/26/2014  . IBS (irritable bowel syndrome) 04/19/2014  . 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 MDD (major depressive disorder) 04/19/2014  . Carpal tunnel syndrome 02/22/2014    Past Surgical History:  Procedure Laterality Date  . CESAREAN SECTION N/A 02/17/2017   Procedure: CESAREAN SECTION;  Surgeon: Adam PhenixJames G Arnold, MD;  [facility];  Service: Obstetrics;  Laterality: N/A;  . CHOLECYSTECTOMY    . MOUTH SURGERY      OB History    Gravida  5   Para  3   Term  3   Preterm      AB  2   Living  3     SAB  2   TAB      Ectopic      Multiple  0   Live Births  3            Home Medications    Prior to Admission medications   Medication Sig Start Date End Date Taking? Authorizing Provider  fexofenadine (ALLEGRA) 30 MG tablet Take 30 mg by mouth 2 (two) times daily.   Yes [provider]  ranitidine (ZANTAC) 150 MG tablet Take 150 mg by mouth 2 (two) times daily.   Yes [provider]  venlafaxine XR (EFFEXOR XR) 150 MG 24 hr capsule  Take 150 mg by mouth daily with breakfast.   Yes [provider]  amoxicillin (AMOXIL) 875 MG tablet Take 1 tablet (875 mg total) by mouth 2 (two) times daily for 7 days. 03/30/18 04/06/18  Cathie Hoops, Amy V, PA-C  fluticasone (FLONASE) 50 MCG/ACT nasal spray Place 2 sprays into both nostrils daily. 03/30/18   Cathie Hoops, Amy V, PA-C  ipratropium (ATROVENT) 0.06 % nasal spray Place 2 sprays into both nostrils 4 (four) times daily. 03/30/18   Cathie Hoops, Amy V, PA-C  montelukast (SINGULAIR) 10 MG tablet Take 1 tablet (10 mg total) by mouth at bedtime. 03/30/18   Cathie Hoops, Amy V, PA-C  predniSONE (DELTASONE) 50 MG tablet Take 1 tablet (50 mg total) by mouth daily for 5 days. 03/30/18 04/04/18  Belinda Fisher, PA-C    Family History Family History    Problem Relation Age of Onset  . Mental illness Mother   . Hyperlipidemia Father   . Cancer Paternal Grandmother        Breast CA    Social History Social History   Tobacco Use  . Smoking status: Former Games developer  . Smokeless tobacco: Never Used  Substance Use Topics  . Alcohol use: No    Alcohol/week: 1.5 oz    Types: 3 Standard drinks or equivalent per week    Comment: social  . Drug use: No     Allergies   Patient has no known allergies.   Review of Systems Review of Systems  Reason unable to perform ROS: See HPI as above.     Physical Exam Triage Vital Signs ED Triage Vitals  Enc Vitals Group     BP 03/30/18 1846 (!) 139/96     Pulse Rate 03/30/18 1846 (!) 103     Resp 03/30/18 1846 20     Temp 03/30/18 1846 98.2 F (36.8 C)     Temp Source 03/30/18 1846 Oral     SpO2 03/30/18 1846 98 %     Weight --      Height --      Head Circumference --      Peak Flow --      Pain Score 03/30/18 1845 6     Pain Loc --      Pain Edu? --      Excl. in GC? --    No data found.  Updated Vital Signs BP (!) 139/96 (BP Location: Left Arm)   Pulse (!) 103   Temp 98.2 F (36.8 C) (Oral)   Resp 20   SpO2 98%   Physical Exam  Constitutional: She is oriented to person, place, and time. She appears well-developed and well-nourished. No distress.  HENT:  Head: Normocephalic and atraumatic.  Right Ear: External ear and ear canal normal. Tympanic membrane is erythematous. Tympanic membrane is not bulging.  Left Ear: External ear and ear canal normal. Tympanic membrane is erythematous. Tympanic membrane is not bulging.  Nose: Mucosal edema and rhinorrhea present. Right sinus exhibits no maxillary sinus tenderness and no frontal sinus tenderness. Left sinus exhibits no maxillary sinus tenderness and no frontal sinus tenderness.  Mouth/Throat: Uvula is midline and mucous membranes are normal. Posterior oropharyngeal erythema present. No tonsillar exudate.  Eyes: Pupils are  equal, round, and reactive to light. Conjunctivae are normal.  Neck: Normal range of motion. Neck supple.  Cardiovascular: Normal rate, regular rhythm and normal heart sounds. Exam reveals no gallop and no friction rub.  No murmur heard. Pulmonary/Chest: Effort normal and breath sounds normal.  She has no decreased breath sounds. She has no wheezes. She has no rhonchi. She has no rales.  Musculoskeletal:  No swelling, erythema, increased warmth, discoloration seen.  No tenderness to palpation of wrist and hand.  However, increased "pins and needle" feeling during palpation.  Full range of motion.  Sensation intact and equal throughout right hand, states "more sensitive" compared to left.  Normal grip strength.  Radial pulse 2+ and equal bilaterally.  Cap refill less than 2 seconds.  Negative Tinel, Phalen's, Finkelstein's.  Lymphadenopathy:    She has no cervical adenopathy.  Neurological: She is alert and oriented to person, place, and time.  Skin: Skin is warm and dry.  Psychiatric: She has a normal mood and affect. Her behavior is normal. Judgment normal.     UC Treatments / Results  Labs (all labs ordered are listed, but only abnormal results are displayed) Labs Reviewed - No data to display  EKG None  Radiology No results found.  Procedures Procedures (including critical care time)  Medications Ordered in UC Medications - No data to display  Initial Impression / Assessment and Plan / UC Course  I have reviewed the triage vital signs and the nursing notes.  Pertinent labs & imaging results that were available during my care of the patient were reviewed by me and considered in my medical decision making (see chart for details).    Discussed with patient history and exam most consistent with allergic rhinitis vs viral URI.  Erythematous TM bilaterally.  Discussed possible eustachian tube dysfunction versus otitis media given 1 day history.  Symptomatic treatment as needed.   Provided written prescription of Singulair, can start if continues to have allergy symptoms despite nasal sprays.  Rx of amoxicillin provided, can start if ear pain does not resolve with nasal spray use.  Push fluids. Return precautions given.   No focal symptoms of the right hand.  Will try prednisone to help with swelling and numbness/tingling.  Wrist splint during activity.  Return precautions given.  Patient expresses understanding and agrees to plan.  Final Clinical Impressions(s) / UC Diagnoses   Final diagnoses:  Seasonal allergic rhinitis, unspecified trigger  Numbness and tingling in right hand    ED Prescriptions    Medication Sig Dispense Auth. Provider   predniSONE (DELTASONE) 50 MG tablet Take 1 tablet (50 mg total) by mouth daily for 5 days. 5 tablet Yu, Amy V, PA-C   fluticasone (FLONASE) 50 MCG/ACT nasal spray Place 2 sprays into both nostrils daily. 1 g Yu, Amy V, PA-C   ipratropium (ATROVENT) 0.06 % nasal spray Place 2 sprays into both nostrils 4 (four) times daily. 15 mL Yu, Amy V, PA-C   montelukast (SINGULAIR) 10 MG tablet  (Status: Discontinued) Take 1 tablet (10 mg total) by mouth at bedtime. 30 tablet Yu, Amy V, PA-C   amoxicillin (AMOXIL) 875 MG tablet Take 1 tablet (875 mg total) by mouth 2 (two) times daily for 7 days. 14 tablet Yu, Amy V, PA-C   montelukast (SINGULAIR) 10 MG tablet Take 1 tablet (10 mg total) by mouth at bedtime. 30 tablet Threasa Alpha, [place] 03/30/18 1920

## 2018-11-13 ENCOUNTER — Encounter (HOSPITAL_COMMUNITY): Payer: Self-pay | Admitting: Emergency Medicine

## 2018-11-13 ENCOUNTER — Ambulatory Visit (HOSPITAL_COMMUNITY)
Admission: EM | Admit: 2018-11-13 | Discharge: 2018-11-13 | Disposition: A | Discharge status: Home / Self Care | Payer: BLUE CROSS/BLUE SHIELD

## 2018-11-13 DIAGNOSIS — R0781 Pleurodynia: Secondary | ICD-10-CM

## 2018-11-13 DIAGNOSIS — M542 Cervicalgia: Secondary | ICD-10-CM

## 2018-11-13 DIAGNOSIS — R109 Unspecified abdominal pain: Secondary | ICD-10-CM

## 2018-11-13 MED ORDER — CYCLOBENZAPRINE HCL 10 MG PO TABS: 10.0000 mg | ORAL_TABLET | Freq: Two times a day (BID) | ORAL | 0 refills | Status: DC | PRN

## 2018-11-13 MED ORDER — NAPROXEN 500 MG PO TABS: 500.0000 mg | ORAL_TABLET | Freq: Two times a day (BID) | ORAL | 0 refills | Status: DC
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
## 2019-03-31 ENCOUNTER — Encounter (HOSPITAL_COMMUNITY): Payer: Self-pay | Admitting: Emergency Medicine

## 2019-03-31 ENCOUNTER — Ambulatory Visit (INDEPENDENT_AMBULATORY_CARE_PROVIDER_SITE_OTHER): Payer: 59

## 2019-03-31 ENCOUNTER — Ambulatory Visit (HOSPITAL_COMMUNITY)
Admission: EM | Admit: 2019-03-31 | Discharge: 2019-03-31 | Disposition: A | Discharge status: Home / Self Care | Payer: 59 | Attending: Family Medicine | Admitting: Family Medicine

## 2019-03-31 ENCOUNTER — Other Ambulatory Visit: Payer: Self-pay

## 2019-03-31 DIAGNOSIS — S83412A Sprain of medial collateral ligament of left knee, initial encounter: Secondary | ICD-10-CM

## 2019-03-31 MED ORDER — PREDNISONE 10 MG (21) PO TBPK: ORAL_TABLET | ORAL | 0 refills | Status: DC

## 2019-03-31 NOTE — Discharge Instructions (Addendum)
This is most likely a ligamentous sprain or small tear. Take the prednisone as prescribed Wear the knee sleeve If your symptoms continue and you see no improvement or worsening over the next week then you need to see orthopedics

## 2019-03-31 NOTE — ED Triage Notes (Signed)
Pt here with left knee pain x 3 days after injuring

## 2019-04-03 NOTE — ED Provider Notes (Signed)
[facility]    CSN: [id number] Arrival date & time: 03/31/19  1259     History   Chief Complaint Chief Complaint  Patient presents with  . Knee Pain    HPI Alicia Rosario is a 38 y.o. female.   Patient is a [age] year old female that presents today with left knee pain.  This is been present and remained the same over the last 3 days.  Reporting that she slipped twisting the knee.  Most of the pain is medial.  There is mild swelling.  She has been able to ambulate on the knee.  Denies any posterior knee pain.  Denies any radiation of pain down the leg, numbness or tingling.  No calf pain, swelling or tenderness.  ROS per HPI      Past Medical History:  Diagnosis Date  . Bipolar II disorder (HCC)   . Depression   . Hypertension   . Irritable bowel syndrome (IBS)     Patient Active Problem List   Diagnosis Date Noted  . History of bilateral tubal ligation 03/31/2017  . 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 Supervision of other normal pregnancy, antepartum 10/06/2016  . 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 Bipolar II disorder (HCC) 08/02/2014  . GAD (generalized anxiety disorder) 08/02/2014  . Mood disorder (HCC) 06/26/2014  . IBS (irritable bowel syndrome) 04/19/2014  . 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 MDD (major depressive disorder) 04/19/2014  . Carpal tunnel syndrome 02/22/2014    Past Surgical History:  Procedure Laterality Date  . CESAREAN SECTION N/A 02/17/2017   Procedure: CESAREAN SECTION;  Surgeon: Adam PhenixJames G Arnold, MD;  [facility];  Service: Obstetrics;  Laterality: N/A;  . CHOLECYSTECTOMY    . MOUTH SURGERY      OB History    Gravida  5   Para  3   Term  3   Preterm      AB  2   Living  3     SAB  2   TAB      Ectopic      Multiple  0   Live Births  3            Home Medications    Prior to Admission medications   Medication Sig Start Date End Date Taking? Authorizing Provider  cyclobenzaprine  (FLEXERIL) 10 MG tablet Take 1 tablet (10 mg total) by mouth 2 (two) times daily as needed for muscle spasms. 11/13/18   Dahlia ByesBast, Maya Arcand A, NP  fexofenadine (ALLEGRA) 30 MG tablet Take 30 mg by mouth 2 (two) times daily.    [provider]  fluticasone (FLONASE) 50 MCG/ACT nasal spray Place 2 sprays into both nostrils daily. Patient not taking: Reported on 11/13/2018 03/30/18   Belinda FisherYu, Amy V, PA-C  ipratropium (ATROVENT) 0.06 % nasal spray Place 2 sprays into both nostrils 4 (four) times daily. Patient not taking: Reported on 11/13/2018 03/30/18   Belinda FisherYu, Amy V, PA-C  montelukast (SINGULAIR) 10 MG tablet Take 1 tablet (10 mg total) by mouth at bedtime. 03/30/18   Cathie HoopsYu, Amy V, PA-C  naproxen (NAPROSYN) 500 MG tablet Take 1 tablet (500 mg total) by mouth 2 (two) times daily. 11/13/18   Dayan Desa, Gloris Manchesterraci A, NP  pantoprazole (PROTONIX) 40 MG tablet Take 40 mg by mouth daily.    [provider]  predniSONE (STERAPRED UNI-PAK 21 TAB) 10 MG (21) TBPK tablet 6 tabs for 1 day, then 5 tabs for 1 das, then 4 tabs  for 1 day, then 3 tabs for 1 day, 2 tabs for 1 day, then 1 tab for 1 day 03/31/19   Dahlia ByesBast, Janaki Exley A, NP  ranitidine (ZANTAC) 150 MG tablet Take 150 mg by mouth 2 (two) times daily.    [provider]  venlafaxine XR (EFFEXOR XR) 150 MG 24 hr capsule Take 225 mg by mouth daily with breakfast.     [provider]    Family History Family History  Problem Relation Age of Onset  . Mental illness Mother   . Hyperlipidemia Father   . Cancer Paternal Grandmother        Breast CA    Social History Social History   Tobacco Use  . Smoking status: Former Games developermoker  . Smokeless tobacco: Never Used  Substance Use Topics  . Alcohol use: No    Alcohol/week: 3.0 standard drinks    Types: 3 Standard drinks or equivalent per week    Comment: social  . Drug use: No     Allergies   Patient has no known allergies.   Review of Systems Review of Systems   Physical Exam Triage Vital Signs ED  Triage Vitals  Enc Vitals Group     BP 03/31/19 1342 (!) 156/99     Pulse Rate 03/31/19 1342 98     Resp 03/31/19 1342 18     Temp 03/31/19 1342 98.3 F (36.8 C)     Temp Source 03/31/19 1342 Oral     SpO2 03/31/19 1342 98 %     Weight --      Height --      Head Circumference --      Peak Flow --      Pain Score 03/31/19 1347 7     Pain Loc --      Pain Edu? --      Excl. in GC? --    No data found.  Updated Vital Signs BP (!) 156/99 (BP Location: Right Arm)   Pulse 98   Temp 98.3 F (36.8 C) (Oral)   Resp 18   SpO2 98%   Visual Acuity Right Eye Distance:   Left Eye Distance:   Bilateral Distance:    Right Eye Near:   Left Eye Near:    Bilateral Near:     Physical Exam Vitals signs and nursing note reviewed.  Constitutional:      Appearance: Normal appearance.  HENT:     Head: Normocephalic and atraumatic.     Nose: Nose normal.  Eyes:     Conjunctiva/sclera: Conjunctivae normal.  Neck:     Musculoskeletal: Normal range of motion.  Pulmonary:     Effort: Pulmonary effort is normal.  Musculoskeletal:        General: Swelling and tenderness present.     Comments: Mild tenderness or swelling to the medial aspect of the left knee. Able to flex and extend the knee without difficulty. Most pain elicited with valgus No redness, bruising or deformities. No posterior knee pain.  Skin:    General: Skin is warm and dry.  Neurological:     Mental Status: She is alert.  Psychiatric:        Mood and Affect: Mood normal.      UC Treatments / Results  Labs (all labs ordered are listed, but only abnormal results are displayed) Labs Reviewed - No data to display  EKG None  Radiology No results found.  Procedures Procedures (including critical care time)  Medications Ordered  in UC Medications - No data to display  Initial Impression / Assessment and Plan / UC Course  I have reviewed the triage vital signs and the nursing notes.  Pertinent labs &  imaging results that were available during my care of the patient were reviewed by me and considered in my medical decision making (see chart for details).     Based on physical exam most likely sprain of the MCL. Applied knee sleeve here in clinic Prednisone taper for pain, inflammation and swelling. Recommended if symptoms continue or worsen she will need to follow with orthopedics for further evaluation management. Final Clinical Impressions(s) / UC Diagnoses   Final diagnoses:  Sprain of medial collateral ligament of left knee, initial encounter     Discharge Instructions     This is most likely a ligamentous sprain or small tear. Take the prednisone as prescribed Wear the knee sleeve If your symptoms continue and you see no improvement or worsening over the next week then you need to see orthopedics    ED Prescriptions    Medication Sig Dispense Auth. Provider   predniSONE (STERAPRED UNI-PAK 21 TAB) 10 MG (21) TBPK tablet 6 tabs for 1 day, then 5 tabs for 1 das, then 4 tabs for 1 day, then 3 tabs for 1 day, 2 tabs for 1 day, then 1 tab for 1 day 21 tablet Rozanna Box, Amarian Botero A, NP     Controlled Substance Prescriptions [place] Controlled Substance Registry consulted? Not Applicable   Orvan July, NP 04/03/19 1527

## 2020-04-30 ENCOUNTER — Ambulatory Visit: Payer: Self-pay | Attending: Internal Medicine

## 2020-05-22 ENCOUNTER — Ambulatory Visit
Admission: RE | Admit: 2020-05-22 | Discharge: 2020-05-22 | Disposition: A | Discharge status: Home / Self Care | Payer: Medicaid Other | Source: Ambulatory Visit | Attending: Emergency Medicine | Admitting: Emergency Medicine

## 2020-05-22 ENCOUNTER — Other Ambulatory Visit: Payer: Self-pay

## 2020-05-22 VITALS — BP 137/96 | HR 98 | Temp 99.2°F | Resp 18

## 2020-05-22 DIAGNOSIS — R519 Headache, unspecified: Secondary | ICD-10-CM

## 2020-05-22 DIAGNOSIS — R030 Elevated blood-pressure reading, without diagnosis of hypertension: Secondary | ICD-10-CM

## 2020-05-22 LAB — POCT URINALYSIS DIP (MANUAL ENTRY)
Bilirubin, UA: NEGATIVE
Blood, UA: NEGATIVE
Glucose, UA: NEGATIVE mg/dL
Ketones, POC UA: NEGATIVE mg/dL
Nitrite, UA: NEGATIVE
Protein Ur, POC: NEGATIVE mg/dL
Spec Grav, UA: 1.015 (ref 1.010–1.025)
Urobilinogen, UA: 0.2 E.U./dL
pH, UA: 7 (ref 5.0–8.0)

## 2020-05-22 LAB — POCT URINE PREGNANCY: Preg Test, Ur: NEGATIVE

## 2020-05-22 NOTE — Discharge Instructions (Addendum)
Your blood pressure is elevated today at 137/96.  Please have this rechecked by your primary care provider in 1-2 weeks.      Your EKG looks similar to the previous one from 2019.    Your lab work is pending.  I will call you with the results in the morning.    Go to the emergency department if you have acute worsening symptoms.

## 2020-05-22 NOTE — ED Triage Notes (Signed)
Patient reports her BP has been elevated. Reports at home it was 167/123 prior to coming to clinic. Also reports headaches.

## 2020-05-22 NOTE — ED Provider Notes (Signed)
Alicia Rosario    CSN: [id number] Arrival date & time: 05/22/20  1112      History   Chief Complaint Chief Complaint  Patient presents with  . Hypertension    HPI Alicia Rosario is a 39 y.o. female.   Patient presents with headache and elevated blood pressure reading at home.  She states her blood pressure at home was 167/123.  Her headache is bilateral generalized, 7/10.  She took Tylenol without relief.  She denies focal weakness, dizziness, palpitations, chest pain, shortness of breath, nausea, diaphoresis, or other symptoms.    The history is provided by the patient.    Past Medical History:  Diagnosis Date  . Bipolar II disorder (HCC)   . Depression   . Hypertension   . Irritable bowel syndrome (IBS)     Patient Active Problem List   Diagnosis Date Noted  . History of bilateral tubal ligation 03/31/2017  . 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 Supervision of other normal pregnancy, antepartum 10/06/2016  . 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 Bipolar II disorder (HCC) 08/02/2014  . GAD (generalized anxiety disorder) 08/02/2014  . Mood disorder (HCC) 06/26/2014  . IBS (irritable bowel syndrome) 04/19/2014  . 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 MDD (major depressive disorder) 04/19/2014  . Carpal tunnel syndrome 02/22/2014    Past Surgical History:  Procedure Laterality Date  . CESAREAN SECTION N/A 02/17/2017   Procedure: CESAREAN SECTION;  Surgeon: Adam Phenix, MD;  Location: [facility];  Service: Obstetrics;  Laterality: N/A;  . CHOLECYSTECTOMY    . MOUTH SURGERY      OB History    Gravida  5   Para  3   Term  3   Preterm      AB  2   Living  3     SAB  2   TAB      Ectopic      Multiple  0   Live Births  3            Home Medications    Prior to Admission medications   Medication Sig Start Date End Date Taking? Authorizing Provider  buPROPion (WELLBUTRIN XL) 150 MG 24 hr tablet Take 150 mg by mouth daily.    Yes [provider]  cyclobenzaprine (FLEXERIL) 10 MG tablet Take 1 tablet (10 mg total) by mouth 2 (two) times daily as needed for muscle spasms. 11/13/18   Dahlia Byes A, NP  fexofenadine (ALLEGRA) 30 MG tablet Take 30 mg by mouth 2 (two) times daily.    [provider]  fluticasone (FLONASE) 50 MCG/ACT nasal spray Place 2 sprays into both nostrils daily. Patient not taking: Reported on 11/13/2018 03/30/18   Belinda Fisher, PA-C  ipratropium (ATROVENT) 0.06 % nasal spray Place 2 sprays into both nostrils 4 (four) times daily. Patient not taking: Reported on 11/13/2018 03/30/18   Belinda Fisher, PA-C  montelukast (SINGULAIR) 10 MG tablet Take 1 tablet (10 mg total) by mouth at bedtime. 03/30/18   Cathie Hoops, Amy V, PA-C  naproxen (NAPROSYN) 500 MG tablet Take 1 tablet (500 mg total) by mouth 2 (two) times daily. 11/13/18   Bast, Gloris Manchester A, NP  pantoprazole (PROTONIX) 40 MG tablet Take 40 mg by mouth daily.    [provider]  predniSONE (STERAPRED UNI-PAK 21 TAB) 10 MG (21) TBPK tablet 6 tabs for 1 day, then 5 tabs for 1 das, then 4 tabs for 1 day, then 3  tabs for 1 day, 2 tabs for 1 day, then 1 tab for 1 day 03/31/19   Dahlia Byes A, NP  ranitidine (ZANTAC) 150 MG tablet Take 150 mg by mouth 2 (two) times daily.    [provider]  venlafaxine XR (EFFEXOR XR) 150 MG 24 hr capsule Take 225 mg by mouth daily with breakfast.     [provider]    Family History Family History  Problem Relation Age of Onset  . Mental illness Mother   . Hyperlipidemia Father   . Cancer Paternal Grandmother        Breast CA    Social History Social History   Tobacco Use  . Smoking status: Former Games developer  . Smokeless tobacco: Never Used  Vaping Use  . Vaping Use: Never used  Substance Use Topics  . Alcohol use: No    Alcohol/week: 3.0 standard drinks    Types: 3 Standard drinks or equivalent per week    Comment: social  . Drug use: No     Allergies   Patient has no known  allergies.   Review of Systems Review of Systems  Constitutional: Negative for chills and fever.  HENT: Negative for ear pain and sore throat.   Eyes: Negative for pain and visual disturbance.  Respiratory: Negative for cough and shortness of breath.   Cardiovascular: Negative for chest pain and palpitations.  Gastrointestinal: Negative for abdominal pain, nausea and vomiting.  Genitourinary: Negative for dysuria and hematuria.  Musculoskeletal: Negative for arthralgias and back pain.  Skin: Negative for color change and rash.  Neurological: Positive for headaches. Negative for dizziness, tremors, seizures, syncope, facial asymmetry, speech difficulty, weakness and numbness.  All other systems reviewed and are negative.    Physical Exam Triage Vital Signs ED Triage Vitals  Enc Vitals Group     BP 05/22/20 1122 (!) 137/96     Pulse Rate 05/22/20 1122 98     Resp 05/22/20 1122 18     Temp 05/22/20 1122 99.2 F (37.3 C)     Temp src --      SpO2 05/22/20 1122 98 %     Weight --      Height --      Head Circumference --      Peak Flow --      Pain Score 05/22/20 1120 7     Pain Loc --      Pain Edu? --      Excl. in GC? --    No data found.  Updated Vital Signs BP (!) 137/96   Pulse 98   Temp 99.2 F (37.3 C)   Resp 18   SpO2 98%   Visual Acuity Right Eye Distance:   Left Eye Distance:   Bilateral Distance:    Right Eye Near:   Left Eye Near:    Bilateral Near:     Physical Exam Vitals and nursing note reviewed.  Constitutional:      General: She is not in acute distress.    Appearance: She is well-developed. She is not ill-appearing.  HENT:     Head: Normocephalic and atraumatic.     Mouth/Throat:     Mouth: Mucous membranes are moist.     Pharynx: Oropharynx is clear.  Eyes:     Conjunctiva/sclera: Conjunctivae normal.  Cardiovascular:     Rate and Rhythm: Normal rate and regular rhythm.     Heart sounds: Normal heart sounds. No murmur heard.    Pulmonary:  Effort: Pulmonary effort is normal. No respiratory distress.     Breath sounds: Normal breath sounds.  Abdominal:     General: Bowel sounds are normal.     Palpations: Abdomen is soft.     Tenderness: There is no abdominal tenderness. There is no guarding or rebound.  Musculoskeletal:        General: Normal range of motion.     Cervical back: Neck supple.     Right lower leg: No edema.     Left lower leg: No edema.  Skin:    General: Skin is warm and dry.     Findings: No rash.  Neurological:     General: No focal deficit present.     Mental Status: She is alert and oriented to person, place, and time.     Sensory: No sensory deficit.     Motor: No weakness.     Coordination: Coordination normal.     Gait: Gait normal.  Psychiatric:        Mood and Affect: Mood normal.        Behavior: Behavior normal.      UC Treatments / Results  Labs (all labs ordered are listed, but only abnormal results are displayed) Labs Reviewed  CBC  COMPREHENSIVE METABOLIC PANEL  POCT URINALYSIS DIP (MANUAL ENTRY)  POCT URINE PREGNANCY    EKG   Radiology No results found.  Procedures Procedures (including critical care time)  Medications Ordered in UC Medications - No data to display  Initial Impression / Assessment and Plan / UC Course  I have reviewed the triage vital signs and the nursing notes.  Pertinent labs & imaging results that were available during my care of the patient were reviewed by me and considered in my medical decision making (see chart for details).   Elevated blood pressure reading.  Acute non-intractable headache.  EKG shows sinus rhythm, rate 77, no ST elevation, compared to previous from January 2019.  CBC, CMP pending.  Urine negative.  Urine pregnancy negative.  Patient to keep a log of her blood pressure readings at home to take to her PCP and to schedule an appointment with her PCP as soon as possible.  Instructed her to go to the ED if  she has acute worsening symptoms.  Patient agrees to plan of care.   Final Clinical Impressions(s) / UC Diagnoses   Final diagnoses:  Elevated blood pressure reading  Acute nonintractable headache, unspecified headache type     Discharge Instructions     Your blood pressure is elevated today at 137/96.  Please have this rechecked by your primary care provider in 1-2 weeks.      Your EKG looks similar to the previous one from 2019.    Your lab work is pending.  I will call you with the results in the morning.    Go to the emergency department if you have acute worsening symptoms.           ED Prescriptions    None     PDMP not reviewed this encounter.   Mickie Bail, NP 05/22/20 1227

## 2020-05-23 LAB — CBC
Hematocrit: 44.9 % (ref 34.0–46.6)
Hemoglobin: 14.9 g/dL (ref 11.1–15.9)
MCH: 25.8 pg — ABNORMAL LOW (ref 26.6–33.0)
MCHC: 33.2 g/dL (ref 31.5–35.7)
MCV: 78 fL — ABNORMAL LOW (ref 79–97)
Platelets: 251 10*3/uL (ref 150–450)
RBC: 5.78 x10E6/uL — ABNORMAL HIGH (ref 3.77–5.28)
RDW: 13.6 % (ref 11.7–15.4)
WBC: 8 10*3/uL (ref 3.4–10.8)

## 2020-05-23 LAB — COMPREHENSIVE METABOLIC PANEL
ALT: 20 IU/L (ref 0–32)
AST: 18 IU/L (ref 0–40)
Albumin/Globulin Ratio: 1.5 (ref 1.2–2.2)
Albumin: 4.6 g/dL (ref 3.8–4.8)
Alkaline Phosphatase: 88 IU/L (ref 48–121)
BUN/Creatinine Ratio: 14 (ref 9–23)
BUN: 10 mg/dL (ref 6–20)
Bilirubin Total: 0.4 mg/dL (ref 0.0–1.2)
CO2: 20 mmol/L (ref 20–29)
Calcium: 9.3 mg/dL (ref 8.7–10.2)
Chloride: 101 mmol/L (ref 96–106)
Creatinine, Ser: 0.71 mg/dL (ref 0.57–1.00)
GFR calc Af Amer: 126 mL/min/{1.73_m2} (ref >59)
GFR calc non Af Amer: 109 mL/min/{1.73_m2} (ref >59)
Globulin, Total: 3.1 g/dL (ref 1.5–4.5)
Glucose: 76 mg/dL (ref 65–99)
Potassium: 4.3 mmol/L (ref 3.5–5.2)
Sodium: 138 mmol/L (ref 134–144)
Total Protein: 7.7 g/dL (ref 6.0–8.5)

## 2020-06-20 ENCOUNTER — Ambulatory Visit (HOSPITAL_COMMUNITY)
Admission: EM | Admit: 2020-06-20 | Discharge: 2020-06-20 | Disposition: A | Discharge status: Home / Self Care | Payer: Medicaid Other

## 2020-06-21 ENCOUNTER — Ambulatory Visit (HOSPITAL_COMMUNITY): Payer: Self-pay

## 2020-07-11 ENCOUNTER — Ambulatory Visit
Admission: EM | Admit: 2020-07-11 | Discharge: 2020-07-11 | Discharge status: Against Medical Advice | Payer: Medicaid Other | Attending: Emergency Medicine | Admitting: Emergency Medicine

## 2020-07-11 DIAGNOSIS — R519 Headache, unspecified: Secondary | ICD-10-CM

## 2020-07-11 DIAGNOSIS — R030 Elevated blood-pressure reading, without diagnosis of hypertension: Secondary | ICD-10-CM

## 2020-07-11 MED ORDER — IBUPROFEN 600 MG PO TABS: 600.0000 mg | ORAL_TABLET | Freq: Four times a day (QID) | ORAL | 0 refills | Status: DC | PRN

## 2020-07-11 MED ORDER — ONDANSETRON HCL 4 MG PO TABS: 4.0000 mg | ORAL_TABLET | Freq: Four times a day (QID) | ORAL | 0 refills | Status: DC

## 2020-07-11 NOTE — Discharge Instructions (Addendum)
Take the ibuprofen as needed for discomfort.  Take the Zofran as needed for nausea.  Schedule the soonest available appointment with your primary care provider for follow up.  Go to the Emergency Department if you have acute worsening symptoms.    Your blood pressure is elevated today at 139/94.  Please have this rechecked by your primary care provider in 2-4 weeks.

## 2020-07-11 NOTE — ED Provider Notes (Signed)
[facility]    CSN: [id number] Arrival date & time: 07/11/20  1437      History   Chief Complaint Chief Complaint  Patient presents with   Headache    HPI Alicia Rosario is a 39 y.o. female.   Patient presents with headache intermittently for >1 month; worse in the past week.  She also reports nausea, photophobia, and sensitivity to noise.  She denies focal weakness, dizziness, chest pain, shortness of breath, abdominal pain, vomiting, or other symptoms.  Patient was seen here on 05/22/2020; diagnosed with elevated blood pressure reading and acute non-intractable headache; patient was instructed to follow-up with her PCP at that time which she has not done.  Treatment attempted at home with Excedrin Migraine.  The history is provided by the patient.    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   MDD (major depressive disorder) 04/19/2014   Carpal tunnel syndrome 02/22/2014    Past Surgical History:  Procedure Laterality Date   CESAREAN SECTION N/A 02/17/2017   Procedure: CESAREAN SECTION;  Surgeon: Adam PhenixJames G Arnold, MD;  [facility];  Service: Obstetrics;  Laterality: N/A;   CHOLECYSTECTOMY     MOUTH SURGERY      OB History    Gravida  5   Para  3   Term  3   Preterm      AB  2   Living  3     SAB  2   TAB      Ectopic      Multiple  0   Live Births  3            Home  Medications    Prior to Admission medications   Medication Sig Start Date End Date Taking? Authorizing Provider  buPROPion (WELLBUTRIN XL) 150 MG 24 hr tablet Take 150 mg by mouth daily.    [provider]  cyclobenzaprine (FLEXERIL) 10 MG tablet Take 1 tablet (10 mg total) by mouth 2 (two) times daily as needed for muscle spasms. 11/13/18   Dahlia ByesBast, Traci A, NP  fexofenadine (ALLEGRA) 30 MG tablet Take 30 mg by mouth 2 (two) times daily.    [provider]  fluticasone (FLONASE) 50 MCG/ACT nasal spray Place 2 sprays into both nostrils daily. Patient not taking: Reported on 11/13/2018 03/30/18   Belinda FisherYu, Amy V, PA-C  ibuprofen (ADVIL) 600 MG tablet Take 1 tablet (600 mg total) by mouth every 6 (six) hours as needed. 07/11/20   Mickie Bailate, Layson Bertsch H, NP  ipratropium (ATROVENT) 0.06 % nasal spray Place 2 sprays into both nostrils 4 (four) times daily. Patient not taking: Reported on 11/13/2018 03/30/18   Belinda FisherYu, Amy V, PA-C  montelukast (SINGULAIR) 10 MG tablet Take 1 tablet (10 mg total) by mouth at bedtime. 03/30/18   Cathie HoopsYu, Amy V, PA-C  naproxen (NAPROSYN) 500 MG tablet Take 1 tablet (500 mg total) by mouth 2 (two) times daily.  11/13/18   Bast, Gloris Manchester A, NP  ondansetron (ZOFRAN) 4 MG tablet Take 1 tablet (4 mg total) by mouth every 6 (six) hours. 07/11/20   Mickie Bail, NP  pantoprazole (PROTONIX) 40 MG tablet Take 40 mg by mouth daily.    [provider]  predniSONE (STERAPRED UNI-PAK 21 TAB) 10 MG (21) TBPK tablet 6 tabs for 1 day, then 5 tabs for 1 das, then 4 tabs for 1 day, then 3 tabs for 1 day, 2 tabs for 1 day, then 1 tab for 1 day 03/31/19   Dahlia Byes A, NP  ranitidine (ZANTAC) 150 MG tablet Take 150 mg by mouth 2 (two) times daily.    [provider]  venlafaxine XR (EFFEXOR XR) 150 MG 24 hr capsule Take 225 mg by mouth daily with breakfast.     [provider]    Family History Family History  Problem Relation Age of Onset   Mental illness Mother    Hyperlipidemia  Father    Cancer Paternal Grandmother        Breast CA    Social History Social History   Tobacco Use   Smoking status: Former Smoker   Smokeless tobacco: Never Used  Building services engineer Use: Never used  Substance Use Topics   Alcohol use: No    Alcohol/week: 3.0 standard drinks    Types: 3 Standard drinks or equivalent per week    Comment: social   Drug use: No     Allergies   Patient has no known allergies.   Review of Systems Review of Systems  Constitutional: Negative for chills and fever.  HENT: Negative for ear pain and sore throat.   Eyes: Positive for photophobia. Negative for pain and visual disturbance.  Respiratory: Negative for cough and shortness of breath.   Cardiovascular: Negative for chest pain and palpitations.  Gastrointestinal: Positive for nausea. Negative for abdominal pain and vomiting.  Genitourinary: Negative for dysuria and hematuria.  Musculoskeletal: Negative for arthralgias and back pain.  Skin: Negative for color change and rash.  Neurological: Positive for headaches. Negative for dizziness, tremors, seizures, syncope, facial asymmetry, speech difficulty, weakness, light-headedness and numbness.  All other systems reviewed and are negative.    Physical Exam Triage Vital Signs ED Triage Vitals  Enc Vitals Group     BP      Pulse      Resp      Temp      Temp src      SpO2      Weight      Height      Head Circumference      Peak Flow      Pain Score      Pain Loc      Pain Edu?      Excl. in GC?    No data found.  Updated Vital Signs BP (!) 139/94    Pulse 88    Temp 99 F (37.2 C)    Resp 14    SpO2 99%   Visual Acuity Right Eye Distance:   Left Eye Distance:   Bilateral Distance:    Right Eye Near:   Left Eye Near:    Bilateral Near:     Physical Exam Vitals and nursing note reviewed.  Constitutional:      General: She is not in acute distress.    Appearance: She is well-developed. She is not  ill-appearing.  HENT:  Head: Normocephalic and atraumatic.     Right Ear: Tympanic membrane normal.     Left Ear: Tympanic membrane normal.     Nose: Nose normal.     Mouth/Throat:     Mouth: Mucous membranes are moist.     Pharynx: Oropharynx is clear.  Eyes:     Conjunctiva/sclera: Conjunctivae normal.  Cardiovascular:     Rate and Rhythm: Normal rate and regular rhythm.     Heart sounds: No murmur heard.   Pulmonary:     Effort: Pulmonary effort is normal. No respiratory distress.     Breath sounds: Normal breath sounds.  Abdominal:     Palpations: Abdomen is soft.     Tenderness: There is no abdominal tenderness. There is no guarding or rebound.  Musculoskeletal:     Cervical back: Neck supple.  Skin:    General: Skin is warm and dry.     Findings: No rash.  Neurological:     General: No focal deficit present.     Mental Status: She is alert and oriented to person, place, and time.     Cranial Nerves: No cranial nerve deficit.     Sensory: No sensory deficit.     Motor: No weakness.     Coordination: Romberg sign negative. Coordination normal.     Gait: Gait normal.  Psychiatric:        Mood and Affect: Mood normal.        Behavior: Behavior normal.      UC Treatments / Results  Labs (all labs ordered are listed, but only abnormal results are displayed) Labs Reviewed - No data to display  EKG   Radiology No results found.  Procedures Procedures (including critical care time)  Medications Ordered in UC Medications - No data to display  Initial Impression / Assessment and Plan / UC Course  I have reviewed the triage vital signs and the nursing notes.  Pertinent labs & imaging results that were available during my care of the patient were reviewed by me and considered in my medical decision making (see chart for details).   Acute non-intractable headache.  Elevated blood pressure reading.  Treating with ibuprofen and Zofran.  Instructed patient to  schedule the soonest available appointment with her PCP and to go to the ED if she has acute worsening symptoms.  Discussed that her blood pressure is elevated today and needs to be rechecked by her PCP in 2 to 4 weeks.  Patient agrees to plan of care.   Final Clinical Impressions(s) / UC Diagnoses   Final diagnoses:  Acute nonintractable headache, unspecified headache type  Elevated blood pressure reading     Discharge Instructions     Take the ibuprofen as needed for discomfort.  Take the Zofran as needed for nausea.  Schedule the soonest available appointment with your primary care provider for follow up.  Go to the Emergency Department if you have acute worsening symptoms.    Your blood pressure is elevated today at 139/94.  Please have this rechecked by your primary care provider in 2-4 weeks.           ED Prescriptions    Medication Sig Dispense Auth. Provider   ibuprofen (ADVIL) 600 MG tablet Take 1 tablet (600 mg total) by mouth every 6 (six) hours as needed. 30 tablet Mickie Bail, NP   ondansetron (ZOFRAN) 4 MG tablet Take 1 tablet (4 mg total) by mouth every 6 (six) hours. 12 tablet Wendee Beavers  H, NP     PDMP not reviewed this encounter.   Mickie Bail, NP 07/11/20 1521

## 2020-07-11 NOTE — ED Triage Notes (Signed)
C/o headaches since last visit with UC on 7/27. States they have gotten significantly worse over the last week. States she is unable to eat, nausea, has photophobia, hypersensitivity to noise, and states she "can't find a position where it doesn't hurt"

## 2020-11-20 ENCOUNTER — Ambulatory Visit: Payer: Self-pay

## 2020-12-02 ENCOUNTER — Other Ambulatory Visit: Payer: Self-pay

## 2020-12-02 ENCOUNTER — Encounter (HOSPITAL_COMMUNITY): Payer: Self-pay | Admitting: Obstetrics and Gynecology

## 2020-12-02 ENCOUNTER — Emergency Department (HOSPITAL_COMMUNITY)
Admission: EM | Admit: 2020-12-02 | Discharge: 2020-12-02 | Disposition: A | Discharge status: Home / Self Care | Payer: Medicaid Other | Attending: Emergency Medicine | Admitting: Emergency Medicine

## 2020-12-02 DIAGNOSIS — Z760 Encounter for issue of repeat prescription: Secondary | ICD-10-CM | POA: Insufficient documentation

## 2020-12-02 DIAGNOSIS — Z87891 Personal history of nicotine dependence: Secondary | ICD-10-CM | POA: Insufficient documentation

## 2020-12-02 DIAGNOSIS — R110 Nausea: Secondary | ICD-10-CM | POA: Insufficient documentation

## 2020-12-02 DIAGNOSIS — I10 Essential (primary) hypertension: Secondary | ICD-10-CM | POA: Insufficient documentation

## 2020-12-02 MED ORDER — ONDANSETRON HCL 4 MG PO TABS: 4.0000 mg | ORAL_TABLET | Freq: Three times a day (TID) | ORAL | 0 refills | Status: DC | PRN

## 2020-12-02 MED ORDER — OMEPRAZOLE 20 MG PO CPDR: 20.0000 mg | DELAYED_RELEASE_CAPSULE | Freq: Every day | ORAL | 0 refills | Status: DC

## 2020-12-02 MED ORDER — VENLAFAXINE HCL ER 150 MG PO CP24: 150.0000 mg | ORAL_CAPSULE | Freq: Every day | ORAL | 2 refills | Status: DC

## 2020-12-02 MED ORDER — ONDANSETRON 4 MG PO TBDP
4.0000 mg | ORAL_TABLET | Freq: Once | ORAL | Status: AC | PRN
  Administered 2020-12-02: 4 mg via ORAL
  Filled 2020-12-02: qty 1

## 2020-12-02 NOTE — Care Management (Addendum)
ED RNCM noted TOC consult for assistance with Effexor, RNCM checked at [facility] medication is $8.00 for 30 day supply. She can also contact the [facility] for ED f/u. RNCM will update ED RN

## 2020-12-02 NOTE — Progress Notes (Signed)
..   Transition of Care [facility]) - Emergency Department Mini Assessment   Patient Details  Name: Alicia Rosario MRN: [id number] Date of Birth: 1981/06/25  Transition of Care [facility]) CM/SW Contact:    Elliot Cousin, RN Phone Number: [phone] 12/02/2020, 1:23 PM   Clinical Narrative: TOC CM spoke to pt and states she working full-time now and has insurance that will start in 90 days. States her PCP is Leilani Able and plans to return when insurance starts. Provided contact and address on AVS for [facility] to follow up until her insurance starts. She states she can afford meds at [facility]. She will need all her currently meds Rx. ED provider updated.    ED Mini Assessment: What brought you to the Emergency Department? : vomiting  Barriers to Discharge: No Barriers Identified  Barrier interventions: provided pt with information at [facility] and [employer] of departure: Car  Interventions which prevented an admission or readmission: Medication Review,Follow-up medical appointment    Patient Contact and Communications        ,                 Admission diagnosis:  Nausea, emesis, chills, headache. Pt states going through "withdrawal" Patient Active Problem List   Diagnosis Date Noted  . History of bilateral tubal ligation 03/31/2017  . 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 Supervision of other normal pregnancy, antepartum 10/06/2016  . 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 Bipolar II disorder (HCC) 08/02/2014  . GAD (generalized anxiety disorder) 08/02/2014  . Mood disorder (HCC) 06/26/2014  . IBS (irritable bowel syndrome) 04/19/2014  . 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 MDD (major depressive disorder) 04/19/2014  . Carpal tunnel syndrome 02/22/2014   PCP:  Patient, No Pcp Per Pharmacy:   [facility] [address], [place] - [address] [place] [address] Phone: [phone]  Fax: [phone]  [facility] - [place], [place] - [address] [place] [address] Phone: [phone] Fax: [phone]

## 2020-12-02 NOTE — Discharge Instructions (Addendum)
I have filled your prescription.  Please take as prescribed.  Have also given you a prescription for Zofran please use as needed for nausea.  Given you a prescription for an acid pill will help with acid reflux please take as prescribed.  Please follow-up with [facility] wellness as they will provide you with a primary care provider.  Please call.  Come back to the emergency department if you develop chest pain, shortness of breath, severe abdominal pain, uncontrolled nausea, vomiting, diarrhea.

## 2020-12-02 NOTE — ED Triage Notes (Signed)
Patient reports she is withdrawing from Effexor. Patient reports she is unable to get her medication as she does not have insurance. Patient reports she has not had any of her Effexor since Thursday and has been having N/V

## 2020-12-02 NOTE — ED Provider Notes (Signed)
[facility]-EMERGENCY DEPT Provider Note   CSN: [id number] Arrival date & time: 12/02/20  1052     History Chief Complaint  Patient presents with  . Withdrawal    Alicia Rosario is a 40 y.o. female.  HPI   Patient with significant medical history of bipolar, depression, hypertension presents with chief complaint of nausea and medication refill.  Patient endorses that she was prescribed Effexor and has been out of her medication since Wednesday.  Since then she has developed worsening anxiety, and some nausea without vomiting.  She thinks this is a side effect of not taking her medications.  This medication was prescribed to her by her primary care doctor but unfortunately she has been without insurance for the last year and no longer has more refills.  She is here today as she needs more refills.  She denies suicidal or homicidal ideations, denies hallucinations or delusions.  Patient has no complaints at this time.  Patient denies headaches, fevers, chills, shortness of breath, chest pain, abdominal pain, vomiting, worsening pedal edema.  Past Medical History:  Diagnosis Date  . Bipolar II disorder (HCC)   . Depression   . Hypertension   . Irritable bowel syndrome (IBS)     Patient Active Problem List   Diagnosis Date Noted  . History of bilateral tubal ligation 03/31/2017  . 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 Supervision of other normal pregnancy, antepartum 10/06/2016  . 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 Bipolar II disorder (HCC) 08/02/2014  . GAD (generalized anxiety disorder) 08/02/2014  . Mood disorder (HCC) 06/26/2014  . IBS (irritable bowel syndrome) 04/19/2014  . 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 MDD (major depressive disorder) 04/19/2014  . Carpal tunnel syndrome 02/22/2014    Past Surgical History:  Procedure Laterality Date  . CESAREAN SECTION N/A 02/17/2017   Procedure: CESAREAN SECTION;  Surgeon: Adam Phenix, MD;  Location: [facility]  BIRTHING SUIT[facility];  Service: Obstetrics;  Laterality: N/A;  . CHOLECYSTECTOMY    . MOUTH SURGERY       OB History    Gravida  5   Para  3   Term  3   Preterm      AB  2   Living  3     SAB  2   IAB      Ectopic      Multiple  0   Live Births  3           Family History  Problem Relation Age of Onset  . Mental illness Mother   . Hyperlipidemia Father   . Cancer Paternal Grandmother        Breast CA    Social History   Tobacco Use  . Smoking status: Former Games developer  . Smokeless tobacco: Never Used  Vaping Use  . Vaping Use: Never used  Substance Use Topics  . Alcohol use: No    Alcohol/week: 3.0 standard drinks    Types: 3 Standard drinks or equivalent per week    Comment: social  . Drug use: No    Home Medications Prior to Admission medications   Medication Sig Start Date End Date Taking? Authorizing Provider  omeprazole (PRILOSEC) 20 MG capsule Take 1 capsule (20 mg total) by mouth daily. 12/02/20 01/01/21 Yes Carroll Sage, PA-C  ondansetron (ZOFRAN) 4 MG tablet Take 1 tablet (4 mg total) by mouth every 8 (eight) hours as needed for nausea or vomiting. 12/02/20  Yes Carroll Sage, PA-C  venlafaxine XR (EFFEXOR XR) 150 MG 24 hr capsule Take 1 capsule (150 mg total) by mouth daily with breakfast. 12/02/20 03/02/21 Yes Carroll Sage, PA-C  buPROPion (WELLBUTRIN XL) 150 MG 24 hr tablet Take 150 mg by mouth daily.    [provider]  cyclobenzaprine (FLEXERIL) 10 MG tablet Take 1 tablet (10 mg total) by mouth 2 (two) times daily as needed for muscle spasms. 11/13/18   Dahlia Byes A, NP  fexofenadine (ALLEGRA) 30 MG tablet Take 30 mg by mouth 2 (two) times daily.    [provider]  fluticasone (FLONASE) 50 MCG/ACT nasal spray Place 2 sprays into both nostrils daily. Patient not taking: Reported on 11/13/2018 03/30/18   Belinda Fisher, PA-C  ibuprofen (ADVIL) 600 MG tablet Take 1 tablet (600 mg total) by mouth every 6 (six) hours as needed.  07/11/20   Mickie Bail, NP  ipratropium (ATROVENT) 0.06 % nasal spray Place 2 sprays into both nostrils 4 (four) times daily. Patient not taking: Reported on 11/13/2018 03/30/18   Belinda Fisher, PA-C  montelukast (SINGULAIR) 10 MG tablet Take 1 tablet (10 mg total) by mouth at bedtime. 03/30/18   Cathie Hoops, Amy V, PA-C  naproxen (NAPROSYN) 500 MG tablet Take 1 tablet (500 mg total) by mouth 2 (two) times daily. 11/13/18   Bast, Gloris Manchester A, NP  ondansetron (ZOFRAN) 4 MG tablet Take 1 tablet (4 mg total) by mouth every 6 (six) hours. 07/11/20   Mickie Bail, NP  pantoprazole (PROTONIX) 40 MG tablet Take 40 mg by mouth daily.    [provider]  predniSONE (STERAPRED UNI-PAK 21 TAB) 10 MG (21) TBPK tablet 6 tabs for 1 day, then 5 tabs for 1 das, then 4 tabs for 1 day, then 3 tabs for 1 day, 2 tabs for 1 day, then 1 tab for 1 day 03/31/19   Dahlia Byes A, NP  ranitidine (ZANTAC) 150 MG tablet Take 150 mg by mouth 2 (two) times daily.    [provider]  venlafaxine XR (EFFEXOR XR) 150 MG 24 hr capsule Take 225 mg by mouth daily with breakfast.     [provider]    Allergies    Patient has no known allergies.  Review of Systems   Review of Systems  Constitutional: Negative for chills and fever.  HENT: Negative for congestion and sore throat.   Eyes: Negative for visual disturbance.  Respiratory: Negative for cough and shortness of breath.   Cardiovascular: Negative for chest pain.  Gastrointestinal: Positive for nausea. Negative for abdominal pain, diarrhea and vomiting.  Genitourinary: Negative for enuresis.  Musculoskeletal: Negative for back pain.  Skin: Negative for rash.  Neurological: Negative for dizziness and headaches.  Hematological: Does not bruise/bleed easily.  Psychiatric/Behavioral: Negative for self-injury and suicidal ideas.    Physical Exam Updated Vital Signs BP 139/69 (BP Location: Left Arm)   Pulse 79   Temp 98.1 F (36.7 C) (Oral)   Resp 16   SpO2 100%    Physical Exam Vitals and nursing note reviewed.  Constitutional:      General: She is not in acute distress.    Appearance: She is not ill-appearing.  HENT:     Head: Normocephalic and atraumatic.     Nose: No congestion.  Eyes:     Conjunctiva/sclera: Conjunctivae normal.  Cardiovascular:     Rate and Rhythm: Normal rate and regular rhythm.     Pulses: Normal pulses.     Heart sounds:  No murmur heard. No friction rub. No gallop.   Pulmonary:     Effort: No respiratory distress.     Breath sounds: No wheezing, rhonchi or rales.  Abdominal:     Palpations: Abdomen is soft.     Tenderness: There is no abdominal tenderness.  Musculoskeletal:     Comments: Patient is moving all 4 extremities at difficulty.  Skin:    General: Skin is warm and dry.  Neurological:     Mental Status: She is alert.     Comments: Patient is having no difficulty with word finding.  Psychiatric:        Mood and Affect: Mood normal.     ED Results / Procedures / Treatments   Labs (all labs ordered are listed, but only abnormal results are displayed) Labs Reviewed - No data to display  EKG None  Radiology No results found.  Procedures Procedures   Medications Ordered in ED Medications  ondansetron (ZOFRAN-ODT) disintegrating tablet 4 mg (4 mg Oral Given 12/02/20 1131)    ED Course  I have reviewed the triage vital signs and the nursing notes.  Pertinent labs & imaging results that were available during my care of the patient were reviewed by me and considered in my medical decision making (see chart for details).    MDM Rules/Calculators/A&P                          Initial impression-patient presents with nausea and medication refill.  She is alert, does not be in acute distress, vital signs reassuring.  Work-up-due to well-appearing patient, benign physical exam for the lab and imaging not warranted at this time.  Rule out-I have low suspicion for psychiatric emergency as  patient denies suicidal or homicidal ideations, denies hallucinations or delusions.  Low suspicion for intra-abdominal abnormality requiring immediate intervention as patient is tolerating p.o., abdomen soft nontender to palpation, no peritoneal signs on my exam.  Low suspicion for systemic infection as patient is nontoxic-appearing, vital signs reassuring, no obvious source infection on my exam.  Plan-I suspect patient's symptoms are secondary due to not taking her Effexor.  Caseworker was notified of patient's situation, Isidoro Donning RN was able to refer patient to community [facility] wellness and help her obtain her medications.  Will represcribe her medications, provide her with Zofran and have her follow-up with community health and wellness for further evaluation.  Vital signs have remained stable, no indication for hospital admission.   Patient given at home care as well strict return precautions.  Patient verbalized that they understood agreed to said plan.   Final Clinical Impression(s) / ED Diagnoses Final diagnoses:  Nausea  Medication refill    Rx / DC Orders ED Discharge Orders         Ordered    venlafaxine XR (EFFEXOR XR) 150 MG 24 hr capsule  Daily with breakfast        12/02/20 1400    ondansetron (ZOFRAN) 4 MG tablet  Every 8 hours PRN        12/02/20 1400    omeprazole (PRILOSEC) 20 MG capsule  Daily        12/02/20 1400           Barnie Del 12/02/20 1417    Pollyann Savoy, MD 12/02/20 1420

## 2021-01-02 NOTE — Progress Notes (Unsigned)
Patient ID: Alicia Rosario, female   DOB: Jun 06, 1981, 40 y.o.   MRN: [id number]   Virtual Visit via Telephone Note  I connected with Alicia Rosario on 01/03/21 at  8:50 AM EST by telephone and verified that I am speaking with the correct person using two identifiers.  Location: Patient: home Provider: [facility] office Screened by Maree Erie   I discussed the limitations, risks, security and privacy concerns of performing an evaluation and management service by telephone and the availability of in person appointments. I also discussed with the patient that there may be a patient responsible charge related to this service. The patient expressed understanding and agreed to proceed.   History of Present Illness: After ED visit 12/02/2020 to get put back on effexor due to w/d symptoms after stopping it abruptly.  She is feeling better but is only taking 150mg  effexor and was previously on 225mg .  She was also previously on wellbutrin and would like for that to be restarted.  Tearful at times.  Worries a lot.  Not sleeping well.   Denies SI/HI.    From A/P: Plan-I suspect patient's symptoms are secondary due to not taking her Effexor.  Caseworker was notified of patient's situation, RN was able to refer patient to community health and wellness and help her obtain her medications.  Will represcribe her medications, provide her with Zofran and have her follow-up with community health and wellness for further evaluation.   Observations/Objective: NAD. A&Ox3  Assessment and Plan: 1. Current moderate episode of major depressive disorder, unspecified whether recurrent (HCC) Improving-will add wellbutrin, keep effexor at same dose for now and PCP will determine next visit whether or not to increase effexor.  trazadone to help with sleep - venlafaxine XR (EFFEXOR XR) 150 MG 24 hr capsule; Take 1 capsule (150 mg total) by mouth daily with breakfast.  Dispense: 30 capsule; Refill: 2 -  buPROPion (WELLBUTRIN XL) 150 MG 24 hr tablet; Take 1 tablet (150 mg total) by mouth daily.  Dispense: 30 tablet; Refill: 3 - traZODone (DESYREL) 50 MG tablet; Take 0.5-1 tablets (25-50 mg total) by mouth at bedtime as needed for sleep.  Dispense: 30 tablet; Refill: 3  2. Encounter for examination following treatment at hospital   Follow Up Instructions: Assign PCP 6-8 weeks   I discussed the assessment and treatment plan with the patient. The patient was provided an opportunity to ask questions and all were answered. The patient agreed with the plan and demonstrated an understanding of the instructions.   The patient was advised to call back or seek an in-person evaluation if the symptoms worsen or if the condition fails to improve as anticipated.  I provided 17 minutes of non-face-to-face time during this encounter.   , PA-C

## 2021-01-03 ENCOUNTER — Other Ambulatory Visit: Payer: Self-pay

## 2021-01-03 ENCOUNTER — Ambulatory Visit: Payer: Self-pay | Attending: Physician Assistant | Admitting: Physician Assistant

## 2021-01-03 DIAGNOSIS — F321 Major depressive disorder, single episode, moderate: Secondary | ICD-10-CM

## 2021-01-03 DIAGNOSIS — Z09 Encounter for follow-up examination after completed treatment for conditions other than malignant neoplasm: Secondary | ICD-10-CM

## 2021-01-03 MED ORDER — BUPROPION HCL ER (XL) 150 MG PO TB24: 150.0000 mg | ORAL_TABLET | Freq: Every day | ORAL | 3 refills | Status: DC

## 2021-01-03 MED ORDER — VENLAFAXINE HCL ER 150 MG PO CP24: 150.0000 mg | ORAL_CAPSULE | Freq: Every day | ORAL | 2 refills | Status: DC

## 2021-01-03 MED ORDER — TRAZODONE HCL 50 MG PO TABS: 25.0000 mg | ORAL_TABLET | Freq: Every evening | ORAL | 3 refills | Status: DC | PRN

## 2021-01-23 ENCOUNTER — Encounter (HOSPITAL_COMMUNITY): Payer: Self-pay

## 2021-01-23 ENCOUNTER — Ambulatory Visit (HOSPITAL_COMMUNITY)
Admission: EM | Admit: 2021-01-23 | Discharge: 2021-01-23 | Disposition: A | Discharge status: Home / Self Care | Payer: Self-pay | Attending: Medical Oncology | Admitting: Medical Oncology

## 2021-01-23 ENCOUNTER — Other Ambulatory Visit: Payer: Self-pay

## 2021-01-23 DIAGNOSIS — S61216A Laceration without foreign body of right little finger without damage to nail, initial encounter: Secondary | ICD-10-CM

## 2021-01-23 MED ORDER — DOXYCYCLINE HYCLATE 100 MG PO CAPS: 100.0000 mg | ORAL_CAPSULE | Freq: Two times a day (BID) | ORAL | 0 refills | Status: DC

## 2021-01-23 NOTE — ED Provider Notes (Signed)
[facility]    CSN: [id number] Arrival date & time: 01/23/21  1102      History   Chief Complaint Chief Complaint  Patient presents with  . Laceration    Right finger    HPI Alicia Rosario is a 40 y.o. female.   HPI  Laceration: Pt reports that this morning she got frustrated and threw items off of her night stand. She states that her right pinky finger caught one of the sharp picture frames she had and cut her. She applied pressure which stopped the bleeding. She denies risk of foreign body. She is able to move and feel her finger. She is UTD on her Tdap as her last booster was 3 years ago per pt.   Past Medical History:  Diagnosis Date  . Bipolar II disorder (HCC)   . Depression   . Hypertension   . Irritable bowel syndrome (IBS)     Patient Active Problem List   Diagnosis Date Noted  . History of bilateral tubal ligation 03/31/2017  . 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 Supervision of other normal pregnancy, antepartum 10/06/2016  . 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 Bipolar II disorder (HCC) 08/02/2014  . GAD (generalized anxiety disorder) 08/02/2014  . Mood disorder (HCC) 06/26/2014  . IBS (irritable bowel syndrome) 04/19/2014  . 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 MDD (major depressive disorder) 04/19/2014  . Carpal tunnel syndrome 02/22/2014    Past Surgical History:  Procedure Laterality Date  . CESAREAN SECTION N/A 02/17/2017   Procedure: CESAREAN SECTION;  Surgeon: Adam Phenix, MD;  Location: [facility] BIRTHING SUITES;  Service: Obstetrics;  Laterality: N/A;  . CHOLECYSTECTOMY    . MOUTH SURGERY      OB History    Gravida  5   Para  3   Term  3   Preterm      AB  2   Living  3     SAB  2   IAB      Ectopic      Multiple  0   Live Births  3            Home Medications    Prior to Admission medications   Medication Sig Start Date End Date Taking? Authorizing Provider  venlafaxine XR (EFFEXOR XR) 150 MG 24 hr  capsule Take 1 capsule (150 mg total) by mouth daily with breakfast. 01/03/21 04/03/21 Yes McClung, Marzella Schlein, PA-C  buPROPion (WELLBUTRIN XL) 150 MG 24 hr tablet Take 1 tablet (150 mg total) by mouth daily. 01/03/21   Anders Simmonds, PA-C  cyclobenzaprine (FLEXERIL) 10 MG tablet Take 1 tablet (10 mg total) by mouth 2 (two) times daily as needed for muscle spasms. 11/13/18   Dahlia Byes A, NP  fexofenadine (ALLEGRA) 30 MG tablet Take 30 mg by mouth 2 (two) times daily.    [provider]  fluticasone (FLONASE) 50 MCG/ACT nasal spray Place 2 sprays into both nostrils daily. Patient not taking: Reported on 11/13/2018 03/30/18   Belinda Fisher, PA-C  ibuprofen (ADVIL) 600 MG tablet Take 1 tablet (600 mg total) by mouth every 6 (six) hours as needed. 07/11/20   Mickie Bail, NP  ipratropium (ATROVENT) 0.06 % nasal spray Place 2 sprays into both nostrils 4 (four) times daily. Patient not taking: Reported on 11/13/2018 03/30/18   Belinda Fisher, PA-C  montelukast (SINGULAIR) 10 MG tablet Take 1 tablet (10 mg total) by mouth at bedtime. 03/30/18  Cathie Hoops, Amy V, PA-C  naproxen (NAPROSYN) 500 MG tablet Take 1 tablet (500 mg total) by mouth 2 (two) times daily. 11/13/18   Dahlia Byes A, NP  omeprazole (PRILOSEC) 20 MG capsule Take 1 capsule (20 mg total) by mouth daily. 12/02/20 01/01/21  Carroll Sage, PA-C  ondansetron (ZOFRAN) 4 MG tablet Take 1 tablet (4 mg total) by mouth every 6 (six) hours. 07/11/20   Mickie Bail, NP  ondansetron (ZOFRAN) 4 MG tablet Take 1 tablet (4 mg total) by mouth every 8 (eight) hours as needed for nausea or vomiting. 12/02/20   Carroll Sage, PA-C  pantoprazole (PROTONIX) 40 MG tablet Take 40 mg by mouth daily.    [provider]  predniSONE (STERAPRED UNI-PAK 21 TAB) 10 MG (21) TBPK tablet 6 tabs for 1 day, then 5 tabs for 1 das, then 4 tabs for 1 day, then 3 tabs for 1 day, 2 tabs for 1 day, then 1 tab for 1 day 03/31/19   Dahlia Byes A, NP  ranitidine (ZANTAC) 150 MG tablet Take  150 mg by mouth 2 (two) times daily.    [provider]  traZODone (DESYREL) 50 MG tablet Take 0.5-1 tablets (25-50 mg total) by mouth at bedtime as needed for sleep. 01/03/21   Anders Simmonds, PA-C    Family History Family History  Problem Relation Age of Onset  . Mental illness Mother   . Hyperlipidemia Father   . Cancer Paternal Grandmother        Breast CA    Social History Social History   Tobacco Use  . Smoking status: Former Games developer  . Smokeless tobacco: Never Used  Vaping Use  . Vaping Use: Never used  Substance Use Topics  . Alcohol use: No    Alcohol/week: 3.0 standard drinks    Types: 3 Standard drinks or equivalent per week    Comment: social  . Drug use: No     Allergies   Patient has no known allergies.   Review of Systems Review of Systems  As stated above in HPI Physical Exam Triage Vital Signs ED Triage Vitals  Enc Vitals Group     BP 01/23/21 1138 (!) 133/102     Pulse Rate 01/23/21 1138 (!) 112     Resp 01/23/21 1138 17     Temp 01/23/21 1138 99.2 F (37.3 C)     Temp Source 01/23/21 1138 Oral     SpO2 01/23/21 1138 99 %     Weight --      Height --      Head Circumference --      Peak Flow --      Pain Score 01/23/21 1136 6     Pain Loc --      Pain Edu? --      Excl. in GC? --    No data found.  Updated Vital Signs BP (!) 133/102 (BP Location: Right Arm)   Pulse (!) 112   Temp 99.2 F (37.3 C) (Oral)   Resp 17   SpO2 99%   Physical Exam Vitals and nursing note reviewed.  Constitutional:      Appearance: Normal appearance.  Musculoskeletal:        General: No swelling or tenderness. Normal range of motion.  Skin:    General: Skin is warm.     Capillary Refill: Capillary refill takes less than 2 seconds.     Comments: There is a .8cm laceration of the right  lateral 5th digit below MVP joint.   Neurological:     General: No focal deficit present.     Mental Status: She is alert.      UC Treatments /  Results  Labs (all labs ordered are listed, but only abnormal results are displayed) Labs Reviewed - No data to display  EKG   Radiology No results found.  Procedures Laceration Repair  Date/Time: 01/23/2021 12:47 PM Performed by: Rushie Chestnut, PA-C Authorized by: Rushie Chestnut, PA-C   Consent:    Consent obtained:  Verbal   Consent given by:  Patient   Risks, benefits, and alternatives were discussed: yes     Risks discussed:  Infection, pain, poor cosmetic result, retained foreign body, poor wound healing, nerve damage, vascular damage and need for additional repair Universal protocol:    Procedure explained and questions answered to patient or proxy's satisfaction: yes     Patient identity confirmed:  Verbally with patient Anesthesia:    Anesthesia method:  Local infiltration   Local anesthetic:  Lidocaine 2% w/o epi Laceration details:    Location:  Finger   Finger location:  R small finger   Length (cm):  8   Depth (mm):  2 Pre-procedure details:    Preparation:  Patient was prepped and draped in usual sterile fashion Exploration:    Limited defect created (wound extended): no     Hemostasis achieved with:  Direct pressure   Imaging outcome: foreign body not noted     Wound exploration: wound explored through full range of motion and entire depth of wound visualized     Wound extent: no foreign bodies/material noted, no muscle damage noted, no nerve damage noted, no tendon damage noted, no underlying fracture noted and no vascular damage noted   Treatment:    Area cleansed with:  Chlorhexidine   Amount of cleaning:  Standard   Irrigation solution:  Sterile saline   Irrigation method:  Syringe   Debridement:  None   Undermining:  None   Scar revision: no     Layers repaired: superficial subcutaneous  Skin repair:    Repair method:  Sutures   Suture size:  4-0   Suture material:  Nylon   Suture technique:  Simple interrupted   Number of sutures:   3 Approximation:    Approximation:  Close Repair type:    Repair type:  Simple Post-procedure details:    Dressing:  Non-adherent dressing   Procedure completion:  Tolerated well, no immediate complications   (including critical care time)  Medications Ordered in UC Medications - No data to display  Initial Impression / Assessment and Plan / UC Course  I have reviewed the triage vital signs and the nursing notes.  Pertinent labs & imaging results that were available during my care of the patient were reviewed by me and considered in my medical decision making (see chart for details).     New. Due to the location and nature of the injury we discussed doxycyline to prevent secondary infection of area. Discussed wound care with patient.   Final Clinical Impressions(s) / UC Diagnoses   Final diagnoses:  None   Discharge Instructions   None    ED Prescriptions    None     PDMP not reviewed this encounter.   Rushie Chestnut, [place] 01/23/21 1252

## 2021-01-23 NOTE — ED Triage Notes (Signed)
Pt presents with a laceration to the right pinky. Bleeding is controlled. Pt states her last tetanus shot was around 3 years ago.

## 2021-01-31 ENCOUNTER — Ambulatory Visit (HOSPITAL_COMMUNITY): Payer: Self-pay

## 2021-02-02 ENCOUNTER — Ambulatory Visit (HOSPITAL_COMMUNITY)
Admission: RE | Admit: 2021-02-02 | Discharge: 2021-02-02 | Disposition: A | Discharge status: Home / Self Care | Payer: Medicaid Other | Source: Ambulatory Visit

## 2021-02-02 ENCOUNTER — Encounter (HOSPITAL_COMMUNITY): Payer: Self-pay

## 2021-02-02 ENCOUNTER — Other Ambulatory Visit: Payer: Self-pay

## 2021-02-02 VITALS — BP 143/99 | HR 88 | Temp 98.6°F | Resp 18

## 2021-02-02 DIAGNOSIS — K115 Sialolithiasis: Secondary | ICD-10-CM

## 2021-02-02 DIAGNOSIS — Z4802 Encounter for removal of sutures: Secondary | ICD-10-CM

## 2021-02-02 NOTE — ED Provider Notes (Signed)
[facility]    CSN: [id number] Arrival date & time: 02/02/21  1648      History   Chief Complaint Chief Complaint  Patient presents with  . Suture / Staple Removal  . tongue problem    HPI Alicia Rosario is a 40 y.o. female.   Patient presents for removal of sutures in her right fifth finger.  The sutures were placed here on 01/23/2021.  She denies signs of infection, including fever or drainage.  Patient also presents with with a "bump" under her tongue since this morning.  She denies pain, fever, chills, sore throat, difficulty swallowing, or other symptoms.  No treatments attempted at home.  Her medical history includes hypertension, IBS, depression, mood disorder, bipolar 2 disorder, anxiety, self-mutilation.   The history is provided by the patient and medical records.    Past Medical History:  Diagnosis Date  . Bipolar II disorder (HCC)   . Depression   . Hypertension   . Irritable bowel syndrome (IBS)     Patient Active Problem List   Diagnosis Date Noted  . History of bilateral tubal ligation 03/31/2017  . 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 Supervision of other normal pregnancy, antepartum 10/06/2016  . 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 Bipolar II disorder (HCC) 08/02/2014  . GAD (generalized anxiety disorder) 08/02/2014  . Mood disorder (HCC) 06/26/2014  . IBS (irritable bowel syndrome) 04/19/2014  . 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 MDD (major depressive disorder) 04/19/2014  . Carpal tunnel syndrome 02/22/2014    Past Surgical History:  Procedure Laterality Date  . CESAREAN SECTION N/A 02/17/2017   Procedure: CESAREAN SECTION;  Surgeon: Adam PhenixJames G Arnold, MD;  [facility];  Service: Obstetrics;  Laterality: N/A;  . CHOLECYSTECTOMY    . MOUTH SURGERY      OB History    Gravida  5   Para  3   Term  3   Preterm      AB  2   Living  3     SAB  2   IAB      Ectopic      Multiple  0   Live Births  3             Home Medications    Prior to Admission medications   Medication Sig Start Date End Date Taking? Authorizing Provider  venlafaxine XR (EFFEXOR XR) 150 MG 24 hr capsule Take 1 capsule (150 mg total) by mouth daily with breakfast. 01/03/21 04/03/21 Yes McClung, Marzella SchleinAngela M, PA-C  buPROPion (WELLBUTRIN XL) 150 MG 24 hr tablet Take 1 tablet (150 mg total) by mouth daily. 01/03/21   Anders SimmondsMcClung, Angela M, PA-C  cyclobenzaprine (FLEXERIL) 10 MG tablet Take 1 tablet (10 mg total) by mouth 2 (two) times daily as needed for muscle spasms. 11/13/18   Dahlia ByesBast, Traci A, NP  doxycycline (VIBRAMYCIN) 100 MG capsule Take 1 capsule (100 mg total) by mouth 2 (two) times daily. 01/23/21   Rushie Chestnutovington, Sarah M, PA-C  fexofenadine (ALLEGRA) 30 MG tablet Take 30 mg by mouth 2 (two) times daily.    [provider]  fluticasone (FLONASE) 50 MCG/ACT nasal spray Place 2 sprays into both nostrils daily. Patient not taking: Reported on 11/13/2018 03/30/18   Belinda FisherYu, Amy V, PA-C  ibuprofen (ADVIL) 600 MG tablet Take 1 tablet (600 mg total) by mouth every 6 (six) hours as needed. 07/11/20   Mickie Bailate, Delene Morais H, NP  ipratropium (ATROVENT) 0.06 % nasal spray  Place 2 sprays into both nostrils 4 (four) times daily. Patient not taking: Reported on 11/13/2018 03/30/18   Belinda Fisher, PA-C  montelukast (SINGULAIR) 10 MG tablet Take 1 tablet (10 mg total) by mouth at bedtime. 03/30/18   Cathie Hoops, Amy V, PA-C  naproxen (NAPROSYN) 500 MG tablet Take 1 tablet (500 mg total) by mouth 2 (two) times daily. 11/13/18   Dahlia Byes A, NP  omeprazole (PRILOSEC) 20 MG capsule Take 1 capsule (20 mg total) by mouth daily. 12/02/20 01/01/21  Carroll Sage, PA-C  ondansetron (ZOFRAN) 4 MG tablet Take 1 tablet (4 mg total) by mouth every 6 (six) hours. 07/11/20   Mickie Bail, NP  ondansetron (ZOFRAN) 4 MG tablet Take 1 tablet (4 mg total) by mouth every 8 (eight) hours as needed for nausea or vomiting. 12/02/20   Carroll Sage, PA-C  pantoprazole (PROTONIX) 40 MG  tablet Take 40 mg by mouth daily.    [provider]  predniSONE (STERAPRED UNI-PAK 21 TAB) 10 MG (21) TBPK tablet 6 tabs for 1 day, then 5 tabs for 1 das, then 4 tabs for 1 day, then 3 tabs for 1 day, 2 tabs for 1 day, then 1 tab for 1 day 03/31/19   Dahlia Byes A, NP  ranitidine (ZANTAC) 150 MG tablet Take 150 mg by mouth 2 (two) times daily.    [provider]  traZODone (DESYREL) 50 MG tablet Take 0.5-1 tablets (25-50 mg total) by mouth at bedtime as needed for sleep. 01/03/21   Anders Simmonds, PA-C    Family History Family History  Problem Relation Age of Onset  . Mental illness Mother   . Hyperlipidemia Father   . Cancer Paternal Grandmother        Breast CA    Social History Social History   Tobacco Use  . Smoking status: Former Games developer  . Smokeless tobacco: Never Used  Vaping Use  . Vaping Use: Never used  Substance Use Topics  . Alcohol use: No    Alcohol/week: 3.0 standard drinks    Types: 3 Standard drinks or equivalent per week    Comment: social  . Drug use: No     Allergies   Patient has no known allergies.   Review of Systems Review of Systems  Constitutional: Negative for chills and fever.  HENT: Negative for ear pain and sore throat.        Bump under tongue  Eyes: Negative for pain and visual disturbance.  Respiratory: Negative for cough and shortness of breath.   Cardiovascular: Negative for chest pain and palpitations.  Gastrointestinal: Negative for abdominal pain and vomiting.  Genitourinary: Negative for dysuria and hematuria.  Musculoskeletal: Negative for arthralgias and back pain.  Skin: Positive for wound. Negative for color change.  Neurological: Negative for seizures and syncope.  All other systems reviewed and are negative.    Physical Exam Triage Vital Signs ED Triage Vitals  Enc Vitals Group     BP 02/02/21 1708 (!) 143/99     Pulse Rate 02/02/21 1708 88     Resp 02/02/21 1708 18     Temp 02/02/21 1708 98.6  F (37 C)     Temp Source 02/02/21 1708 Oral     SpO2 02/02/21 1708 99 %     Weight --      Height --      Head Circumference --      Peak Flow --      Pain  Score 02/02/21 1703 0     Pain Loc --      Pain Edu? --      Excl. in GC? --    No data found.  Updated Vital Signs BP (!) 143/99 (BP Location: Left Arm)   Pulse 88   Temp 98.6 F (37 C) (Oral)   Resp 18   SpO2 99%   Visual Acuity Right Eye Distance:   Left Eye Distance:   Bilateral Distance:    Right Eye Near:   Left Eye Near:    Bilateral Near:     Physical Exam Vitals and nursing note reviewed.  Constitutional:      General: She is not in acute distress.    Appearance: She is well-developed. She is not ill-appearing.  HENT:     Head: Normocephalic and atraumatic.     Mouth/Throat:     Mouth: Mucous membranes are moist.     Pharynx: Oropharynx is clear.     Comments: Speech clear, no oropharyngeal swelling, no difficulty swallowing.  Small white stone noted beneath tongue at left duct of submandibular gland. Eyes:     Conjunctiva/sclera: Conjunctivae normal.  Cardiovascular:     Rate and Rhythm: Normal rate and regular rhythm.     Heart sounds: No murmur heard.   Pulmonary:     Effort: Pulmonary effort is normal. No respiratory distress.     Breath sounds: Normal breath sounds.  Abdominal:     Palpations: Abdomen is soft.     Tenderness: There is no abdominal tenderness.  Musculoskeletal:     Cervical back: Neck supple.  Skin:    General: Skin is warm and dry.     Findings: Lesion present.     Comments: Sutures removed by CMA.  No drainage or erythema.  Neurological:     General: No focal deficit present.     Mental Status: She is alert and oriented to person, place, and time.     Gait: Gait normal.  Psychiatric:        Mood and Affect: Mood normal.        Behavior: Behavior normal.      UC Treatments / Results  Labs (all labs ordered are listed, but only abnormal results are  displayed) Labs Reviewed - No data to display  EKG   Radiology No results found.  Procedures Procedures (including critical care time)  Medications Ordered in UC Medications - No data to display  Initial Impression / Assessment and Plan / UC Course  I have reviewed the triage vital signs and the nursing notes.  Pertinent labs & imaging results that were available during my care of the patient were reviewed by me and considered in my medical decision making (see chart for details).   Salivary stone.  Visit for suture removal.  Education provided on salivary stones and patient instructed to encourage salivation with sour candy.  Instructed her to follow-up with ENT if her symptoms are not improving.  Sutures removed by CMA and no indication of infection.  Instructed patient to follow-up with her PCP as needed.  She agrees to plan of care.   Final Clinical Impressions(s) / UC Diagnoses   Final diagnoses:  Salivary stone  Visit for suture removal     Discharge Instructions     Encourage salivation with sour candy.  See the attached information on salivary stones.  Follow-up with an ENT if your symptoms are not improving.    See the attached information on  care of a wound after sutures removed.  Follow-up with your primary care provider as needed.        ED Prescriptions    None     PDMP not reviewed this encounter.   Mickie Bail, NP 02/02/21 1733

## 2021-02-02 NOTE — ED Triage Notes (Signed)
Pt is here for suture removal and states today she woke up with a white spot on the bottom of her tongue and is concerned.

## 2021-02-02 NOTE — Discharge Instructions (Signed)
Encourage salivation with sour candy.  See the attached information on salivary stones.  Follow-up with an ENT if your symptoms are not improving.    See the attached information on care of a wound after sutures removed.  Follow-up with your primary care provider as needed.

## 2021-02-19 ENCOUNTER — Ambulatory Visit: Payer: Self-pay | Attending: Family Medicine | Admitting: Family Medicine

## 2021-02-19 ENCOUNTER — Encounter: Payer: Self-pay | Admitting: Family Medicine

## 2021-02-19 ENCOUNTER — Other Ambulatory Visit: Payer: Self-pay

## 2021-02-19 VITALS — BP 132/95 | HR 91 | Resp 20 | Ht 64.0 in | Wt 217.6 lb

## 2021-02-19 DIAGNOSIS — Z79899 Other long term (current) drug therapy: Secondary | ICD-10-CM | POA: Insufficient documentation

## 2021-02-19 DIAGNOSIS — G4452 New daily persistent headache (NDPH): Secondary | ICD-10-CM | POA: Insufficient documentation

## 2021-02-19 DIAGNOSIS — F321 Major depressive disorder, single episode, moderate: Secondary | ICD-10-CM

## 2021-02-19 DIAGNOSIS — G4709 Other insomnia: Secondary | ICD-10-CM | POA: Insufficient documentation

## 2021-02-19 DIAGNOSIS — F329 Major depressive disorder, single episode, unspecified: Secondary | ICD-10-CM | POA: Insufficient documentation

## 2021-02-19 MED ORDER — BUPROPION HCL ER (XL) 150 MG PO TB24
150.0000 mg | ORAL_TABLET | Freq: Every day | ORAL | 1 refills | Status: DC
  Filled 2021-02-19 – 2021-03-15 (×3): qty 30, 30d supply, fill #0
  Filled 2021-05-07: qty 30, 30d supply, fill #1

## 2021-02-19 MED ORDER — AMITRIPTYLINE HCL 10 MG PO TABS
10.0000 mg | ORAL_TABLET | Freq: Every day | ORAL | 1 refills | Status: DC
  Filled 2021-02-19 – 2021-03-15 (×3): qty 30, 30d supply, fill #0

## 2021-02-19 MED ORDER — VENLAFAXINE HCL ER 150 MG PO CP24
150.0000 mg | ORAL_CAPSULE | Freq: Every day | ORAL | 1 refills | Status: DC
  Filled 2021-02-19 – 2021-03-05 (×3): qty 30, 30d supply, fill #0
  Filled 2021-05-07: qty 30, 30d supply, fill #1

## 2021-02-19 MED ORDER — TRAZODONE HCL 50 MG PO TABS
50.0000 mg | ORAL_TABLET | Freq: Every evening | ORAL | 1 refills | Status: DC | PRN
  Filled 2021-02-19 – 2021-02-26 (×2): qty 30, 30d supply, fill #0

## 2021-02-19 NOTE — Progress Notes (Signed)
Subjective:  Patient ID: Alicia Rosario, female    DOB: July 27, 1981  Age: 40 y.o. MRN: [id number]  CC: Establish Care   HPI Alicia Rosario is a 40 year old female with a history of depression who presents to establish care.  Previously followed by Haskel SchroederBetty Reese as her PCP but she lost coverage when she lost hours on her job. She finally lost her job last month and this has taken a mental toll on her.  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 She states she was on Effexor 150mg  and 75mg  which had been ineffective for her depression but is currently on just 150 mg daily. She endorses presence of crying spells, reduced appetite. Trazodone is ineffectve with regards to insomnia; also used Wellbutrin which she states caused her to plateau. She has insomnia - she sleeps 10pm- 2am and is awake after that until about 6 AM.  Has been working on sleep hygiene with no improvement in sleep pattern.  Also has a toddler. She feelslike an elephant is sitting on her head and this has been since she had COVID late last year.  The headache has been persistent and is different from a sinus headache.  Denies blurry vision, nausea or vomiting.  Past Medical History:  Diagnosis Date  . Bipolar II disorder (HCC)   . Depression   . Hypertension   . Irritable bowel syndrome (IBS)     Past Surgical History:  Procedure Laterality Date  . CESAREAN SECTION N/A 02/17/2017   Procedure: CESAREAN SECTION;  Surgeon: Adam PhenixJames G Arnold, MD;  [facility];  Service: Obstetrics;  Laterality: N/A;  . CHOLECYSTECTOMY    . MOUTH SURGERY      Family History  Problem Relation Age of Onset  . Mental illness Mother   . Hyperlipidemia Father   . Cancer Paternal Grandmother        Breast CA    No Known Allergies  Outpatient Medications Prior to Visit  Medication Sig Dispense Refill  . famotidine (PEPCID) 20 MG tablet Take 20 mg by mouth daily.    Marland Kitchen. levocetirizine (XYZAL) 5 MG tablet Take 5 mg by mouth  every evening.    Marland Kitchen. buPROPion (WELLBUTRIN XL) 150 MG 24 hr tablet Take 1 tablet (150 mg total) by mouth daily. 30 tablet 3  . cyclobenzaprine (FLEXERIL) 10 MG tablet Take 1 tablet (10 mg total) by mouth 2 (two) times daily as needed for muscle spasms. (Patient not taking: Reported on 02/19/2021) 20 tablet 0  . doxycycline (VIBRAMYCIN) 100 MG capsule Take 1 capsule (100 mg total) by mouth 2 (two) times daily. (Patient not taking: Reported on 02/19/2021) 20 capsule 0  . fexofenadine (ALLEGRA) 30 MG tablet Take 30 mg by mouth 2 (two) times daily. (Patient not taking: Reported on 02/19/2021)    . fluticasone (FLONASE) 50 MCG/ACT nasal spray Place 2 sprays into both nostrils daily. (Patient not taking: No sig reported) 1 g 0  . ibuprofen (ADVIL) 600 MG tablet Take 1 tablet (600 mg total) by mouth every 6 (six) hours as needed. (Patient not taking: Reported on 02/19/2021) 30 tablet 0  . ipratropium (ATROVENT) 0.06 % nasal spray Place 2 sprays into both nostrils 4 (four) times daily. (Patient not taking: No sig reported) 15 mL 0  . montelukast (SINGULAIR) 10 MG tablet Take 1 tablet (10 mg total) by mouth at bedtime. (Patient not taking: Reported on 02/19/2021) 30 tablet 0  . naproxen (NAPROSYN) 500 MG tablet Take 1 tablet (  500 mg total) by mouth 2 (two) times daily. (Patient not taking: Reported on 02/19/2021) 30 tablet 0  . omeprazole (PRILOSEC) 20 MG capsule Take 1 capsule (20 mg total) by mouth daily. 30 capsule 0  . ondansetron (ZOFRAN) 4 MG tablet Take 1 tablet (4 mg total) by mouth every 6 (six) hours. (Patient not taking: Reported on 02/19/2021) 12 tablet 0  . ondansetron (ZOFRAN) 4 MG tablet Take 1 tablet (4 mg total) by mouth every 8 (eight) hours as needed for nausea or vomiting. (Patient not taking: Reported on 02/19/2021) 12 tablet 0  . pantoprazole (PROTONIX) 40 MG tablet Take 40 mg by mouth daily. (Patient not taking: Reported on 02/19/2021)    . predniSONE (STERAPRED UNI-PAK 21 TAB) 10 MG (21) TBPK  tablet 6 tabs for 1 day, then 5 tabs for 1 das, then 4 tabs for 1 day, then 3 tabs for 1 day, 2 tabs for 1 day, then 1 tab for 1 day (Patient not taking: Reported on 02/19/2021) 21 tablet 0  . ranitidine (ZANTAC) 150 MG tablet Take 150 mg by mouth 2 (two) times daily. (Patient not taking: Reported on 02/19/2021)    . traZODone (DESYREL) 50 MG tablet Take 0.5-1 tablets (25-50 mg total) by mouth at bedtime as needed for sleep. 30 tablet 3  . venlafaxine XR (EFFEXOR XR) 150 MG 24 hr capsule Take 1 capsule (150 mg total) by mouth daily with breakfast. 30 capsule 2   No facility-administered medications prior to visit.     ROS Review of Systems  Constitutional: Negative for activity change, appetite change and fatigue.  HENT: Negative for congestion, sinus pressure and sore throat.   Eyes: Negative for visual disturbance.  Respiratory: Negative for cough, chest tightness, shortness of breath and wheezing.   Cardiovascular: Negative for chest pain and palpitations.  Gastrointestinal: Negative for abdominal distention, abdominal pain and constipation.  Endocrine: Negative for polydipsia.  Genitourinary: Negative for dysuria and frequency.  Musculoskeletal: Negative for arthralgias and back pain.  Skin: Negative for rash.  Neurological: Positive for headaches. Negative for tremors, light-headedness and numbness.  Hematological: Does not bruise/bleed easily.  Psychiatric/Behavioral: Positive for dysphoric mood and sleep disturbance. Negative for agitation and behavioral problems.    Objective:  BP (!) 132/95   Pulse 91   Resp 20   Ht 5\' 4"  (1.626 m)   Wt 217 lb 9.6 oz (98.7 kg)   SpO2 95%   BMI 37.35 kg/m   BP/Weight 02/19/2021 02/02/2021 01/23/2021  Systolic BP 132 143 133  Diastolic BP 95 99 102  Wt. (Lbs) 217.6 - -  BMI 37.35 - -      Physical Exam Constitutional:      Appearance: She is well-developed.  HENT:     Head:     Comments: No sinus tenderness to palpation or  percussion    Right Ear: Tympanic membrane normal.     Left Ear: Tympanic membrane normal.     Mouth/Throat:     Mouth: Mucous membranes are moist.  Neck:     Vascular: No JVD.  Cardiovascular:     Rate and Rhythm: Normal rate.     Heart sounds: Normal heart sounds. No murmur heard.   Pulmonary:     Effort: Pulmonary effort is normal.     Breath sounds: Normal breath sounds. No wheezing or rales.  Chest:     Chest wall: No tenderness.  Abdominal:     General: Bowel sounds are normal. There is no distension.  Palpations: Abdomen is soft. There is no mass.     Tenderness: There is no abdominal tenderness.  Musculoskeletal:        General: Normal range of motion.     Right lower leg: No edema.     Left lower leg: No edema.  Neurological:     Mental Status: She is alert and oriented to person, place, and time.  Psychiatric:        Mood and Affect: Mood normal.     CMP Latest Ref Rng & Units 05/22/2020 09/09/2016 07/03/2016  Glucose 65 - 99 mg/dL 76 67 75  BUN 6 - 20 mg/dL 10 7 8   Creatinine 0.57 - 1.00 mg/dL 7.67 3.41  Sodium 134 - 144 mmol/L 138 135 136  Potassium 3.5 - 5.2 mmol/L 4.3 4.2 3.9  Chloride 96 - 106 mmol/L 101 105 103  CO2 20 - 29 mmol/L 20 21(L) 27  Calcium 8.7 - 10.2 mg/dL 9.3 9.4 9.4  Total Protein 6.0 - 8.5 g/dL 7.7 7.1 9.37)  Total Bilirubin 0.0 - 1.2 mg/dL 0.4 0.5 0.5  Alkaline Phos 48 - 121 IU/L 88 46 61  AST 0 - 40 IU/L 18 32 26  ALT 0 - 32 IU/L 20 30 37    Lipid Panel  No results found for: CHOL, TRIG, HDL, CHOLHDL, VLDL, LDLCALC, LDLDIRECT  CBC    Component Value Date/Time   WBC 8.0 05/22/2020 1213   WBC 9.8 02/18/2017 0601   RBC 5.78 (H) 05/22/2020 1213   RBC 3.75 (L) 02/18/2017 0601   HGB 14.9 05/22/2020 1213   HCT 44.9 05/22/2020 1213   PLT 251 05/22/2020 1213   MCV 78 (L) 05/22/2020 1213   MCH 25.8 (L) 05/22/2020 1213   MCH 26.1 02/18/2017 0601   MCHC 33.2 05/22/2020 1213   MCHC 35.4 02/18/2017 0601   RDW 13.6 05/22/2020  1213   LYMPHSABS 2.2 09/08/2016 1613   MONOABS 0.6 02/05/2016 1715   EOSABS 0.4 09/08/2016 1613   BASOSABS 0.0 09/08/2016 1613    No results found for: HGBA1C  Assessment & Plan:  1. Current moderate episode of major depressive disorder, unspecified whether recurrent (HCC) Uncontrolled Explained that she is on multiple medications and we do have room to increase trazodone but I will hold off on this time given I am adding Elavil to her regimen for insomnia Will reassess at next visit Consider psychiatry referral if symptoms are refractory - venlafaxine XR (EFFEXOR XR) 150 MG 24 hr capsule; Take 1 capsule (150 mg total) by mouth daily with breakfast.  Dispense: 30 capsule; Refill: 1 - traZODone (DESYREL) 50 MG tablet; Take 1 tablet (50 mg total) by mouth at bedtime as needed for sleep.  Dispense: 30 tablet; Refill: 1 - buPROPion (WELLBUTRIN XL) 150 MG 24 hr tablet; Take 1 tablet (150 mg total) by mouth daily.  Dispense: 30 tablet; Refill: 1  2. New daily persistent headache Placed on Elavil which will be beneficial with regards to insomnia - amitriptyline (ELAVIL) 10 MG tablet; Take 1 tablet (10 mg total) by mouth at bedtime.  Dispense: 30 tablet; Refill: 1  3. Other insomnia Work on sleep hygiene Placed on Elavil Discussed adverse effects of Elavil and patient is to notify the clinic if she develops any of this.    Meds ordered this encounter  Medications  . venlafaxine XR (EFFEXOR XR) 150 MG 24 hr capsule    Sig: Take 1 capsule (150 mg total) by mouth daily with breakfast.  Dispense:  30 capsule    Refill:  1  . amitriptyline (ELAVIL) 10 MG tablet    Sig: Take 1 tablet (10 mg total) by mouth at bedtime.    Dispense:  30 tablet    Refill:  1  . traZODone (DESYREL) 50 MG tablet    Sig: Take 1 tablet (50 mg total) by mouth at bedtime as needed for sleep.    Dispense:  30 tablet    Refill:  1  . buPROPion (WELLBUTRIN XL) 150 MG 24 hr tablet    Sig: Take 1 tablet (150 mg  total) by mouth daily.    Dispense:  30 tablet    Refill:  1    Follow-up: Return in about 6 weeks (around 04/02/2021) for Medical conditions.       Hoy Register, MD, FAAFP. [facility] [place], [place] [phone]   02/19/2021, 1:23 PM

## 2021-02-19 NOTE — Patient Instructions (Signed)

## 2021-02-26 ENCOUNTER — Other Ambulatory Visit: Payer: Self-pay

## 2021-03-05 ENCOUNTER — Other Ambulatory Visit: Payer: Self-pay

## 2021-03-15 ENCOUNTER — Other Ambulatory Visit: Payer: Self-pay

## 2021-03-19 ENCOUNTER — Other Ambulatory Visit: Payer: Self-pay

## 2021-04-24 ENCOUNTER — Ambulatory Visit: Payer: Medicaid Other | Admitting: Family Medicine

## 2021-05-07 ENCOUNTER — Other Ambulatory Visit: Payer: Self-pay

## 2021-05-08 ENCOUNTER — Other Ambulatory Visit: Payer: Self-pay

## 2021-08-08 ENCOUNTER — Telehealth: Payer: Self-pay | Admitting: Family Medicine

## 2021-08-08 NOTE — Telephone Encounter (Signed)
Pt would like Dr. Alvis Lemmings to send a tapering refill for  venlafaxine XR (EFFEXOR XR) 150 MG 24 hr capsule so pt can start coming off of medication/ please advise

## 2021-08-09 ENCOUNTER — Other Ambulatory Visit: Payer: Self-pay

## 2021-08-09 ENCOUNTER — Ambulatory Visit: Payer: 59 | Attending: Internal Medicine | Admitting: Internal Medicine

## 2021-08-09 ENCOUNTER — Ambulatory Visit: Payer: Self-pay

## 2021-08-09 NOTE — Telephone Encounter (Signed)
Pt. States she missed a virtual appointment today - was in another appointment. Asking if she can be worked in today. Has been out of her Effexor x 2 days.Is having chills, sweats and nausea. Please advise . Practice currently closed for lunch.     Answer Assessment - Initial Assessment Questions 1. DRUG NAME: "What medicine do you need to have refilled?"     Effexor 2. REFILLS REMAINING: "How many refills are remaining?" (Note: The label on the medicine or pill bottle will show how many refills are remaining. If there are no refills remaining, then a renewal may be needed.)     0 3. EXPIRATION DATE: "What is the expiration date?" (Note: The label states when the prescription will expire, and thus can no longer be refilled.)     N/a 4. PRESCRIBING HCP: "Who prescribed it?" Reason: If prescribed by specialist, call should be referred to that group.     N/a 5. SYMPTOMS: "Do you have any symptoms?"    Chills, sweats, nausea 6. PREGNANCY: "Is there any chance that you are pregnant?" "When was your last menstrual period?"     No  Protocols used: Medication Refill and Renewal Call-A-AH

## 2021-08-09 NOTE — Telephone Encounter (Signed)
Called patient to schedule apt for Monday with Dr. Alvis Lemmings. Patient states she will go to [facility].

## 2021-08-09 NOTE — Telephone Encounter (Signed)
Appointment made for 08/09/21

## 2021-08-09 NOTE — Telephone Encounter (Signed)
Please schedule virtual appointment.  Thank you

## 2021-08-12 ENCOUNTER — Ambulatory Visit (HOSPITAL_COMMUNITY)
Admission: RE | Admit: 2021-08-12 | Discharge: 2021-08-12 | Disposition: A | Discharge status: Home / Self Care | Payer: 59 | Source: Ambulatory Visit | Attending: Emergency Medicine | Admitting: Emergency Medicine

## 2021-08-12 ENCOUNTER — Encounter (HOSPITAL_COMMUNITY): Payer: Self-pay

## 2021-08-12 ENCOUNTER — Other Ambulatory Visit: Payer: Self-pay

## 2021-08-12 VITALS — BP 128/89 | HR 63 | Temp 98.7°F | Resp 16

## 2021-08-12 DIAGNOSIS — F321 Major depressive disorder, single episode, moderate: Secondary | ICD-10-CM | POA: Diagnosis not present

## 2021-08-12 DIAGNOSIS — F32A Depression, unspecified: Secondary | ICD-10-CM

## 2021-08-12 MED ORDER — VENLAFAXINE HCL ER 150 MG PO CP24: 150.0000 mg | ORAL_CAPSULE | Freq: Every day | ORAL | 1 refills | Status: DC

## 2021-08-12 NOTE — Discharge Instructions (Signed)
Follow up with a primary care provider or psychiatrist for management of your depression and anxiety.  If your symptoms change or worsen, you can go to the [facility] at [address] in [place]. Their phone number is [phone]

## 2021-08-12 NOTE — ED Triage Notes (Addendum)
Pt presents with nausea and night terrors. States has gone 3 days without Effexor. Okay with E Raspet, PA for pt to be seen at [facility].

## 2021-08-12 NOTE — ED Provider Notes (Signed)
[facility]    CSN: [id number] Arrival date & time: 08/12/21  8099      History   Chief Complaint Chief Complaint  Patient presents with   Nausea    APPT   night terrors    HPI Alicia Rosario is a 40 y.o. female. She ran out of her effexor several days ago and is experiencing nausea and night terrors. She has had similar symptoms in the past when she has abruptly run out of her medicine.  She has been on Effexor for a years.  She is having difficulty finding a psychiatrist to prescribe it for her.  She reports her current PCP is uncomfortable prescribing it.  She is frustrated that she is not able to get it on a consistent basis and initially requests a prescription to taper off of it.  After lengthy discussion with the patient about her depressive symptoms, the reason she is taking Effexor and how it works for her, patient changes her request to getting her Effexor refilled until she can get a new primary care provider and/or psychiatrist who will prescribe it for her.  She reports she is hoping to have a new PCP appointment within the next 2 weeks.  Without Effexor, she reports crying spells and depressed mood.  1 reason she was initially placed on Effexor is that she would cut herself to relieve symptoms.  She has not cut herself in years.  She denies suicidal ideation or homicidal ideation.  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    MDD (major depressive disorder) 04/19/2014   Carpal tunnel syndrome 02/22/2014    Past Surgical History:  Procedure Laterality Date   CESAREAN SECTION N/A 02/17/2017   Procedure: CESAREAN SECTION;  Surgeon: Adam Phenix, MD;  Location: [facility];  Service: Obstetrics;  Laterality: N/A;   CHOLECYSTECTOMY     MOUTH SURGERY      OB History     Gravida  5   Para  3   Term  3   Preterm      AB  2   Living  3      SAB  2   IAB      Ectopic      Multiple  0   Live Births  3            Home Medications    Prior to Admission medications   Medication Sig Start Date End Date Taking? Authorizing Provider  amitriptyline (ELAVIL) 10 MG tablet Take 1 tablet (10 mg total) by mouth at bedtime. 02/19/21   Hoy Register, MD  buPROPion (WELLBUTRIN XL) 150 MG 24 hr tablet Take 1 tablet (150 mg total) by mouth daily. 02/19/21   Hoy Register, MD  cyclobenzaprine (FLEXERIL) 10 MG tablet Take 1 tablet (10 mg total) by mouth 2 (two) times daily as needed for muscle spasms. Patient not taking: Reported on 02/19/2021 11/13/18  Dahlia Byes A, NP  doxycycline (VIBRAMYCIN) 100 MG capsule Take 1 capsule (100 mg total) by mouth 2 (two) times daily. Patient not taking: Reported on 02/19/2021 01/23/21   Rushie Chestnut, PA-C  famotidine (PEPCID) 20 MG tablet Take 20 mg by mouth daily.    [provider]  fexofenadine (ALLEGRA) 30 MG tablet Take 30 mg by mouth 2 (two) times daily. Patient not taking: Reported on 02/19/2021    [provider]  fluticasone (FLONASE) 50 MCG/ACT nasal spray Place 2 sprays into both nostrils daily. Patient not taking: No sig reported 03/30/18   Belinda Fisher, PA-C  ibuprofen (ADVIL) 600 MG tablet Take 1 tablet (600 mg total) by mouth every 6 (six) hours as needed. Patient not taking: Reported on 02/19/2021 07/11/20   Mickie Bail, NP  ipratropium (ATROVENT) 0.06 % nasal spray Place 2 sprays into both nostrils 4 (four) times  daily. Patient not taking: No sig reported 03/30/18   Belinda Fisher, PA-C  levocetirizine (XYZAL) 5 MG tablet Take 5 mg by mouth every evening.    [provider]  montelukast (SINGULAIR) 10 MG tablet Take 1 tablet (10 mg total) by mouth at bedtime. Patient not taking: Reported on 02/19/2021 03/30/18   Belinda Fisher, PA-C  naproxen (NAPROSYN) 500 MG tablet Take 1 tablet (500 mg total) by mouth 2 (two) times daily. Patient not taking: Reported on 02/19/2021 11/13/18   Dahlia Byes A, NP  omeprazole (PRILOSEC) 20 MG capsule Take 1 capsule (20 mg total) by mouth daily. 12/02/20 01/01/21  Carroll Sage, PA-C  ondansetron (ZOFRAN) 4 MG tablet Take 1 tablet (4 mg total) by mouth every 6 (six) hours. Patient not taking: Reported on 02/19/2021 07/11/20   Mickie Bail, NP  ondansetron (ZOFRAN) 4 MG tablet Take 1 tablet (4 mg total) by mouth every 8 (eight) hours as needed for nausea or vomiting. Patient not taking: Reported on 02/19/2021 12/02/20   Carroll Sage, PA-C  pantoprazole (PROTONIX) 40 MG tablet Take 40 mg by mouth daily. Patient not taking: Reported on 02/19/2021    [provider]  predniSONE (STERAPRED UNI-PAK 21 TAB) 10 MG (21) TBPK tablet 6 tabs for 1 day, then 5 tabs for 1 das, then 4 tabs for 1 day, then 3 tabs for 1 day, 2 tabs for 1 day, then 1 tab for 1 day Patient not taking: Reported on 02/19/2021 03/31/19   Dahlia Byes A, NP  ranitidine (ZANTAC) 150 MG tablet Take 150 mg by mouth 2 (two) times daily. Patient not taking: Reported on 02/19/2021    [provider]  traZODone (DESYREL) 50 MG tablet Take 1 tablet (50 mg total) by mouth at bedtime as needed for sleep. 02/19/21   Hoy Register, MD  venlafaxine XR (EFFEXOR XR) 150 MG 24 hr capsule Take 1 capsule (150 mg total) by mouth daily with breakfast. 08/12/21 10/11/21  Cathlyn Parsons, NP    Family History Family History  Problem Relation Age of Onset   Mental illness Mother    Hyperlipidemia Father    Cancer  Paternal Grandmother        Breast CA    Social History Social History   Tobacco Use   Smoking status: Former   Smokeless tobacco: Never  Building services engineer Use: Never used  Substance Use Topics   Alcohol use: No    Alcohol/week: 3.0 standard drinks    Types: 3 Standard drinks or equivalent per week  Comment: social   Drug use: No     Allergies   Patient has no known allergies.   Review of Systems Review of Systems   Physical Exam Triage Vital Signs ED Triage Vitals  Enc Vitals Group     BP 08/12/21 0918 128/89     Pulse Rate 08/12/21 0918 63     Resp 08/12/21 0918 16     Temp 08/12/21 0918 98.7 F (37.1 C)     Temp Source 08/12/21 0918 Oral     SpO2 08/12/21 0918 98 %     Weight --      Height --      Head Circumference --      Peak Flow --      Pain Score 08/12/21 0917 0     Pain Loc --      Pain Edu? --      Excl. in GC? --    No data found.  Updated Vital Signs BP 128/89 (BP Location: Left Arm)   Pulse 63   Temp 98.7 F (37.1 C) (Oral)   Resp 16   SpO2 98%   Visual Acuity Right Eye Distance:   Left Eye Distance:   Bilateral Distance:    Right Eye Near:   Left Eye Near:    Bilateral Near:     Physical Exam Constitutional:      Appearance: Normal appearance. She is not ill-appearing.  Pulmonary:     Effort: Pulmonary effort is normal.  Neurological:     Mental Status: She is alert.  Psychiatric:        Attention and Perception: Attention and perception normal.        Mood and Affect: Mood normal.        Speech: Speech normal.        Behavior: Behavior normal. Behavior is cooperative.        Thought Content: Thought content normal. Thought content does not include homicidal or suicidal ideation. Thought content does not include homicidal or suicidal plan.        Cognition and Memory: Cognition normal.        Judgment: Judgment normal.     UC Treatments / Results  Labs (all labs ordered are listed, but only abnormal results  are displayed) Labs Reviewed - No data to display  EKG   Radiology No results found.  Procedures Procedures (including critical care time)  Medications Ordered in UC Medications - No data to display  Initial Impression / Assessment and Plan / UC Course  I have reviewed the triage vital signs and the nursing notes.  Pertinent labs & imaging results that were available during my care of the patient were reviewed by me and considered in my medical decision making (see chart for details).  After lengthy discussion, the current plan is for me to refill her Effexor and give her 1 refill.  She will work on getting connected with a new PCP and/or psychiatrist who can meet her needs.  If her depressive symptoms worsen or change, we agreed that she would seek urgent care for her symptoms at the [facility] behavioral health urgent care center.   Final Clinical Impressions(s) / UC Diagnoses   Final diagnoses:  Depression, unspecified depression type  Current moderate episode of major depressive disorder, unspecified whether recurrent [facility])     Discharge Instructions      Follow up with a primary care provider or psychiatrist for management of your depression and anxiety.  If  your symptoms change or worsen, you can go to the Behavioral Health Urgent Care at [address] in [place]. Their phone number is [phone]   ED Prescriptions     Medication Sig Dispense Auth. Provider   venlafaxine XR (EFFEXOR XR) 150 MG 24 hr capsule Take 1 capsule (150 mg total) by mouth daily with breakfast. 30 capsule Kjersti Dittmer, Marzella Schlein, NP      PDMP not reviewed this encounter.   Cathlyn Parsons, NP 08/12/21 1133

## 2021-09-23 ENCOUNTER — Other Ambulatory Visit: Payer: Self-pay | Admitting: Physician Assistant

## 2021-09-23 DIAGNOSIS — F321 Major depressive disorder, single episode, moderate: Secondary | ICD-10-CM

## 2021-09-24 NOTE — Telephone Encounter (Signed)
Requested medications are due for refill today yes  Requested medications are on the active medication list yes  Last refill 08/27/21  Last visit 02/19/21  Future visit scheduled NO, NO SHOW 04/24/21 for follow up, seen in ED for depression 08/12/21  Notes to clinic Pt only filled the rx for 30 days with one refill twice since April. Just past the 6 month point for revisit for protocol, however with missed visit, ED visit and no upcoming appt, please assess. Requested Prescriptions  Pending Prescriptions Disp Refills   buPROPion (WELLBUTRIN XL) 150 MG 24 hr tablet [Pharmacy Med Name: BUPROPION XL 150MG  TABLETS (24 H)] 30 tablet 1    Sig: TAKE 1 TABLET BY MOUTH EVERY DAY     Psychiatry: Antidepressants - bupropion Passed - 09/23/2021  3:43 AM      Passed - Completed PHQ-2 or PHQ-9 in the last 360 days      Passed - Last BP in normal range    BP Readings from Last 1 Encounters:  08/12/21 128/89          Passed - Valid encounter within last 6 months    Recent Outpatient Visits           7 months ago New daily persistent headache   [facility] [place], [place], [place]   8 months ago Current moderate episode of major depressive disorder, unspecified whether recurrent [facility])   [facility] [place], [place], [place]   7 years ago Pain of female genitalia   Primary Care at [facility], [facility], [place]

## 2021-11-18 ENCOUNTER — Ambulatory Visit
Admission: RE | Admit: 2021-11-18 | Discharge: 2021-11-18 | Disposition: A | Discharge status: Home / Self Care | Payer: 59 | Source: Ambulatory Visit | Attending: Family Medicine | Admitting: Family Medicine

## 2021-11-18 ENCOUNTER — Other Ambulatory Visit: Payer: Self-pay | Admitting: Family Medicine

## 2021-11-18 DIAGNOSIS — T1490XA Injury, unspecified, initial encounter: Secondary | ICD-10-CM

## 2022-03-10 ENCOUNTER — Ambulatory Visit (HOSPITAL_COMMUNITY): Admission: EM | Admit: 2022-03-10 | Discharge: 2022-03-10 | Discharge status: Against Medical Advice | Payer: 59

## 2022-03-10 NOTE — Progress Notes (Signed)
?   03/10/22 1257  ?[place] Triage Screening (Walk-ins at [facility] only)  ?How Did You Hear About [place]? Self  ?What Is the Reason for Your Visit/Call Today? 41 year old Alicia Rosario present to the [facility] with depressive symptoms of fatigue, decreased sleeping, irritability and lack of motivation. Report her primary doctor @  [facility] prescribed her Vraylar 4.5mg  and Effexor 225mg . Denied suicidal/homicidal ideations and denied auditory/visual hallucinations. Report she needs assistance getting a psychiatric. Patient report she has been dealing wiht mental health since the age of 41 year old and today she's feels like something is not right.  ?How Long Has This Been Causing You Problems? > than 6 months  ?Have You Recently Had Any Thoughts About Hurting Yourself? No  ?Are You Planning to Commit Suicide/Harm Yourself At This time? No  ?Have you Recently Had Thoughts About [place]? No  ?Are You Planning To Harm Someone At This Time? No  ?Are you currently experiencing any auditory, visual or other hallucinations? No  ?Have You Used Any Alcohol or Drugs in the Past 24 Hours? No  ?Do you have any current medical co-morbidities that require immediate attention? No  ?Clinician description of patient physical appearance/behavior: Dressed appropriately for the weather, cooperative and pleasant towards assessor.  ?What Do You Feel Would Help You the Most Today? Medication(s)  ?If access to [facility] was not available, would you have sought care in the Emergency Department? No  ?Determination of Need Routine (7 days)  ?Options For Referral Medication Management  ? ? ?

## 2022-04-09 ENCOUNTER — Ambulatory Visit: Payer: 59 | Admitting: Obstetrics and Gynecology

## 2022-09-09 ENCOUNTER — Ambulatory Visit (INDEPENDENT_AMBULATORY_CARE_PROVIDER_SITE_OTHER): Payer: No Typology Code available for payment source

## 2022-09-09 ENCOUNTER — Encounter (HOSPITAL_COMMUNITY): Payer: Self-pay

## 2022-09-09 ENCOUNTER — Ambulatory Visit (HOSPITAL_COMMUNITY)
Admission: RE | Admit: 2022-09-09 | Discharge: 2022-09-09 | Disposition: A | Discharge status: Home / Self Care | Payer: No Typology Code available for payment source | Source: Ambulatory Visit | Attending: Emergency Medicine | Admitting: Emergency Medicine

## 2022-09-09 VITALS — BP 126/83 | HR 79 | Temp 98.0°F | Resp 16

## 2022-09-09 DIAGNOSIS — R053 Chronic cough: Secondary | ICD-10-CM

## 2022-09-09 DIAGNOSIS — R0602 Shortness of breath: Secondary | ICD-10-CM | POA: Diagnosis not present

## 2022-09-09 DIAGNOSIS — R059 Cough, unspecified: Secondary | ICD-10-CM

## 2022-09-09 LAB — POC URINE PREG, ED: Preg Test, Ur: NEGATIVE

## 2022-09-09 MED ORDER — DOXYCYCLINE HYCLATE 100 MG PO CAPS: 100.0000 mg | ORAL_CAPSULE | Freq: Two times a day (BID) | ORAL | 0 refills | Status: AC

## 2022-09-09 NOTE — Discharge Instructions (Addendum)
X ray was negative. I am treating you for a lung infection given the duration of your symptoms.  Take the mediation as prescribed, with food and lots of water. Please follow up with your primary care provider if symptoms persist. With any worsening symptoms, go to the emergency department.

## 2022-09-09 NOTE — ED Triage Notes (Signed)
Pt is here for cough x1 month, pt recently started having SOB, and lower back pain on the left side x 3days

## 2022-09-09 NOTE — ED Provider Notes (Signed)
[facility]    CSN: [id number] Arrival date & time: 09/09/22  1144     History   Chief Complaint Chief Complaint  Patient presents with   Cough    Phlegm stuck in throat, headache, vomiting, difficulty breathing. - Entered by patient    HPI Alicia Rosario is a 41 y.o. female.  Presents with 1 month history of productive cough Worsening over the last 3 days Has developed shortness of breath as well.  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   MDD (major depressive disorder) 04/19/2014   Carpal tunnel syndrome 02/22/2014    Past Surgical History:  Procedure Laterality Date   CESAREAN SECTION N/A 02/17/2017   Procedure: CESAREAN SECTION;  Surgeon: Adam Phenix, MD;  Location: [facility] BIRTHING SUITES;  Service: Obstetrics;  Laterality: N/A;   CHOLECYSTECTOMY     MOUTH SURGERY      OB History     Gravida  5   Para  3   Term  3   Preterm      AB  2   Living  3      SAB  2   IAB      Ectopic      Multiple  0   Live Births  3            Home Medications    Prior to Admission medications   Medication Sig Start Date End Date Taking? Authorizing Provider  doxycycline (VIBRAMYCIN) 100 MG capsule Take 1 capsule (100 mg  total) by mouth 2 (two) times daily for 5 days. 09/09/22 09/14/22 Yes Sylvanus Telford, Lurena Joiner, PA-C  amitriptyline (ELAVIL) 10 MG tablet Take 1 tablet (10 mg total) by mouth at bedtime. 02/19/21   Hoy Register, MD  buPROPion (WELLBUTRIN XL) 150 MG 24 hr tablet Take 1 tablet (150 mg total) by mouth daily. 02/19/21   Hoy Register, MD  famotidine (PEPCID) 20 MG tablet Take 20 mg by mouth daily.    [provider]  omeprazole (PRILOSEC) 20 MG capsule Take 1 capsule (20 mg total) by mouth daily. 12/02/20 01/01/21  Carroll Sage, PA-C  traZODone (DESYREL) 50 MG tablet Take 1 tablet (50 mg total) by mouth at bedtime as needed for sleep. 02/19/21   Hoy Register, MD  venlafaxine XR (EFFEXOR XR) 150 MG 24 hr capsule Take 1 capsule (150 mg total) by mouth daily with breakfast. 08/12/21 10/11/21  Cathlyn Parsons, NP    Family History Family History  Problem Relation Age of Onset   Mental illness Mother    Hyperlipidemia Father    Cancer Paternal Grandmother        Breast CA    Social History Social History   Tobacco Use  Smoking status: Former   Smokeless tobacco: Never  Building services engineer Use: Never used  Substance Use Topics   Alcohol use: No    Alcohol/week: 3.0 standard drinks of alcohol    Types: 3 Standard drinks or equivalent per week    Comment: social   Drug use: No     Allergies   Patient has no known allergies.   Review of Systems Review of Systems  Respiratory:  Positive for cough.   As per HPI    Physical Exam Triage Vital Signs ED Triage Vitals  Enc Vitals Group     BP 09/09/22 1216 126/83     Pulse Rate 09/09/22 1216 79     Resp 09/09/22 1216 16     Temp 09/09/22 1216 98 F (36.7 C)     Temp Source 09/09/22 1216 Oral     SpO2 09/09/22 1216 98 %     Weight --      Height --      Head Circumference --      Peak Flow --      Pain Score 09/09/22 1213 10     Pain Loc --      Pain Edu? --      Excl. in GC? --    No data found.  Updated Vital  Signs BP 126/83 (BP Location: Left Arm)   Pulse 79   Temp 98 F (36.7 C) (Oral)   Resp 16   LMP  (LMP Unknown)   SpO2 98%   Physical Exam Vitals and nursing note reviewed.  Constitutional:      General: She is not in acute distress.    Appearance: She is not ill-appearing.  HENT:     Nose: No rhinorrhea.     Mouth/Throat:     Mouth: Mucous membranes are moist.     Pharynx: Oropharynx is clear.  Eyes:     Conjunctiva/sclera: Conjunctivae normal.  Cardiovascular:     Rate and Rhythm: Normal rate and regular rhythm.     Pulses: Normal pulses.     Heart sounds: Normal heart sounds.  Pulmonary:     Effort: Pulmonary effort is normal. No respiratory distress.     Comments: Coarse lower lobe sounds Abdominal:     Tenderness: There is no abdominal tenderness. There is no guarding.  Lymphadenopathy:     Cervical: No cervical adenopathy.  Skin:    General: Skin is warm and dry.  Neurological:     Mental Status: She is alert and oriented to person, place, and time.     UC Treatments / Results  Labs (all labs ordered are listed, but only abnormal results are displayed) Labs Reviewed  POC URINE PREG, ED    EKG  Radiology DG Chest 2 View  Result Date: 09/09/2022 CLINICAL DATA:  Cough for 1 month.  Shortness of breath. EXAM: CHEST - 2 VIEW COMPARISON:  None Available. FINDINGS: The heart size and mediastinal contours are within normal limits. Low lung volumes with bibasilar atelectasis. No appreciable pleural effusion. The visualized skeletal structures are unremarkable. IMPRESSION: Low lung volumes with bibasilar atelectasis. No focal consolidation or pleural effusion. Electronically Signed   By: Larose Hires D.O.   On: 09/09/2022 13:57    Procedures Procedures   Medications Ordered in UC Medications - No data to display  Initial Impression / Assessment and Plan / UC Course  I have reviewed the triage vital signs and the nursing notes.  Pertinent labs & imaging  results that were available during my care of the patient were reviewed by me and considered in my medical decision making (see chart for details).  Urine pregnancy negative. Chest xray negative for infiltrate or consolidation.  With duration of symptoms will cover with monotherapy doxycycline BID x 5 days. Patient is instructed to return if symptoms persist despite antibiotic.  Discussed following up with primary care as needed, strict ED precautions for worsening symptoms.  Patient agrees with plan  Final Clinical Impressions(s) / UC Diagnoses   Final diagnoses:  Persistent cough for 3 weeks or longer     Discharge Instructions      X ray was negative. I am treating you for a lung infection given the duration of your symptoms.  Take the mediation as prescribed, with food and lots of water. Please follow up with your primary care provider if symptoms persist. With any worsening symptoms, go to the emergency department.     ED Prescriptions     Medication Sig Dispense Auth. Provider   doxycycline (VIBRAMYCIN) 100 MG capsule Take 1 capsule (100 mg total) by mouth 2 (two) times daily for 5 days. 10 capsule Giovana Faciane, Lurena Joiner, PA-C      PDMP not reviewed this encounter.   Marlow Baars, [place] 09/09/22 1411

## 2023-08-23 ENCOUNTER — Inpatient Hospital Stay (HOSPITAL_COMMUNITY)
Admission: EM | Admit: 2023-08-23 | Discharge: 2023-08-25 | DRG: 322 | Disposition: A | Discharge status: Home / Self Care | Payer: Medicaid Other | Attending: Cardiology | Admitting: Cardiology

## 2023-08-23 ENCOUNTER — Emergency Department (HOSPITAL_COMMUNITY): Payer: Medicaid Other

## 2023-08-23 ENCOUNTER — Other Ambulatory Visit: Payer: Self-pay

## 2023-08-23 ENCOUNTER — Encounter (HOSPITAL_COMMUNITY): Payer: Self-pay | Admitting: Cardiology

## 2023-08-23 ENCOUNTER — Encounter (HOSPITAL_COMMUNITY)
Admission: EM | Disposition: A | Discharge status: Home / Self Care | Payer: Self-pay | Source: Home / Self Care | Attending: Cardiology

## 2023-08-23 DIAGNOSIS — I2121 ST elevation (STEMI) myocardial infarction involving left circumflex coronary artery: Secondary | ICD-10-CM

## 2023-08-23 DIAGNOSIS — F3181 Bipolar II disorder: Secondary | ICD-10-CM | POA: Diagnosis present

## 2023-08-23 DIAGNOSIS — Z6837 Body mass index (BMI) 37.0-37.9, adult: Secondary | ICD-10-CM | POA: Diagnosis not present

## 2023-08-23 DIAGNOSIS — F322 Major depressive disorder, single episode, severe without psychotic features: Secondary | ICD-10-CM | POA: Diagnosis not present

## 2023-08-23 DIAGNOSIS — Z7902 Long term (current) use of antithrombotics/antiplatelets: Secondary | ICD-10-CM

## 2023-08-23 DIAGNOSIS — E785 Hyperlipidemia, unspecified: Secondary | ICD-10-CM | POA: Diagnosis present

## 2023-08-23 DIAGNOSIS — K589 Irritable bowel syndrome without diarrhea: Secondary | ICD-10-CM | POA: Diagnosis present

## 2023-08-23 DIAGNOSIS — K219 Gastro-esophageal reflux disease without esophagitis: Secondary | ICD-10-CM | POA: Diagnosis present

## 2023-08-23 DIAGNOSIS — Z955 Presence of coronary angioplasty implant and graft: Secondary | ICD-10-CM

## 2023-08-23 DIAGNOSIS — Z87891 Personal history of nicotine dependence: Secondary | ICD-10-CM | POA: Diagnosis not present

## 2023-08-23 DIAGNOSIS — I2119 ST elevation (STEMI) myocardial infarction involving other coronary artery of inferior wall: Secondary | ICD-10-CM | POA: Diagnosis present

## 2023-08-23 DIAGNOSIS — I25110 Atherosclerotic heart disease of native coronary artery with unstable angina pectoris: Secondary | ICD-10-CM

## 2023-08-23 DIAGNOSIS — Z79899 Other long term (current) drug therapy: Secondary | ICD-10-CM | POA: Diagnosis not present

## 2023-08-23 DIAGNOSIS — E66812 Obesity, class 2: Secondary | ICD-10-CM | POA: Diagnosis present

## 2023-08-23 DIAGNOSIS — F329 Major depressive disorder, single episode, unspecified: Secondary | ICD-10-CM | POA: Diagnosis present

## 2023-08-23 DIAGNOSIS — I2510 Atherosclerotic heart disease of native coronary artery without angina pectoris: Secondary | ICD-10-CM | POA: Diagnosis present

## 2023-08-23 DIAGNOSIS — I249 Acute ischemic heart disease, unspecified: Principal | ICD-10-CM

## 2023-08-23 DIAGNOSIS — I10 Essential (primary) hypertension: Secondary | ICD-10-CM | POA: Diagnosis present

## 2023-08-23 DIAGNOSIS — Z7982 Long term (current) use of aspirin: Secondary | ICD-10-CM

## 2023-08-23 DIAGNOSIS — F411 Generalized anxiety disorder: Secondary | ICD-10-CM | POA: Diagnosis present

## 2023-08-23 DIAGNOSIS — I25119 Atherosclerotic heart disease of native coronary artery with unspecified angina pectoris: Secondary | ICD-10-CM | POA: Diagnosis present

## 2023-08-23 HISTORY — PX: CORONARY/GRAFT ACUTE MI REVASCULARIZATION: CATH118305

## 2023-08-23 LAB — POCT I-STAT, CHEM 8
BUN: 8 mg/dL (ref 6–20)
Calcium, Ion: 1.2 mmol/L (ref 1.15–1.40)
Chloride: 103 mmol/L (ref 98–111)
Creatinine, Ser: 0.6 mg/dL (ref 0.44–1.00)
Glucose, Bld: 123 mg/dL — ABNORMAL HIGH (ref 70–99)
HCT: 38 % (ref 36.0–46.0)
Hemoglobin: 12.9 g/dL (ref 12.0–15.0)
Potassium: 3.1 mmol/L — ABNORMAL LOW (ref 3.5–5.1)
Sodium: 140 mmol/L (ref 135–145)
TCO2: 23 mmol/L (ref 22–32)

## 2023-08-23 LAB — CBC WITH DIFFERENTIAL/PLATELET
Abs Immature Granulocytes: 0.03 10*3/uL (ref 0.00–0.07)
Basophils Absolute: 0 10*3/uL (ref 0.0–0.1)
Basophils Relative: 0 %
Eosinophils Absolute: 0.4 10*3/uL (ref 0.0–0.5)
Eosinophils Relative: 5 %
HCT: 39.3 % (ref 36.0–46.0)
Hemoglobin: 13.2 g/dL (ref 12.0–15.0)
Immature Granulocytes: 0 %
Lymphocytes Relative: 38 %
Lymphs Abs: 3.2 10*3/uL (ref 0.7–4.0)
MCH: 24.9 pg — ABNORMAL LOW (ref 26.0–34.0)
MCHC: 33.6 g/dL (ref 30.0–36.0)
MCV: 74 fL — ABNORMAL LOW (ref 80.0–100.0)
Monocytes Absolute: 0.8 10*3/uL (ref 0.1–1.0)
Monocytes Relative: 9 %
Neutro Abs: 4 10*3/uL (ref 1.7–7.7)
Neutrophils Relative %: 48 %
Platelets: 259 10*3/uL (ref 150–400)
RBC: 5.31 MIL/uL — ABNORMAL HIGH (ref 3.87–5.11)
RDW: 13.2 % (ref 11.5–15.5)
WBC: 8.4 10*3/uL (ref 4.0–10.5)
nRBC: 0 % (ref 0.0–0.2)

## 2023-08-23 LAB — POCT ACTIVATED CLOTTING TIME: Activated Clotting Time: 287 s

## 2023-08-23 LAB — PROTIME-INR
INR: 1 (ref 0.8–1.2)
Prothrombin Time: 13.2 s (ref 11.4–15.2)

## 2023-08-23 LAB — COMPREHENSIVE METABOLIC PANEL
ALT: 18 U/L (ref 0–44)
AST: 17 U/L (ref 15–41)
Albumin: 3.7 g/dL (ref 3.5–5.0)
Alkaline Phosphatase: 65 U/L (ref 38–126)
Anion gap: 10 (ref 5–15)
BUN: 9 mg/dL (ref 6–20)
CO2: 22 mmol/L (ref 22–32)
Calcium: 8.8 mg/dL — ABNORMAL LOW (ref 8.9–10.3)
Chloride: 107 mmol/L (ref 98–111)
Creatinine, Ser: 0.71 mg/dL (ref 0.44–1.00)
GFR, Estimated: >60 mL/min (ref >60)
Glucose, Bld: 107 mg/dL — ABNORMAL HIGH (ref 70–99)
Potassium: 3.4 mmol/L — ABNORMAL LOW (ref 3.5–5.1)
Sodium: 139 mmol/L (ref 135–145)
Total Bilirubin: 0.3 mg/dL (ref 0.3–1.2)
Total Protein: 7.3 g/dL (ref 6.5–8.1)

## 2023-08-23 LAB — LIPID PANEL
Cholesterol: 155 mg/dL (ref 0–200)
HDL: 35 mg/dL — ABNORMAL LOW (ref >40)
LDL Cholesterol: 101 mg/dL — ABNORMAL HIGH (ref 0–99)
Total CHOL/HDL Ratio: 4.4 {ratio}
Triglycerides: 95 mg/dL (ref <150)
VLDL: 19 mg/dL (ref 0–40)

## 2023-08-23 LAB — HCG, SERUM, QUALITATIVE: Preg, Serum: NEGATIVE

## 2023-08-23 LAB — TROPONIN I (HIGH SENSITIVITY)
Troponin I (High Sensitivity): 17 ng/L (ref <18)
Troponin I (High Sensitivity): 1908 ng/L (ref <18)

## 2023-08-23 LAB — APTT: aPTT: 29 s (ref 24–36)

## 2023-08-23 LAB — LIPASE, BLOOD: Lipase: 31 U/L (ref 11–51)

## 2023-08-23 LAB — I-STAT CG4 LACTIC ACID, ED: Lactic Acid, Venous: 1.4 mmol/L (ref 0.5–1.9)

## 2023-08-23 LAB — CG4 I-STAT (LACTIC ACID): Lactic Acid, Venous: 1.1 mmol/L (ref 0.5–1.9)

## 2023-08-23 LAB — MRSA NEXT GEN BY PCR, NASAL: MRSA by PCR Next Gen: NOT DETECTED

## 2023-08-23 SURGERY — CORONARY/GRAFT ACUTE MI REVASCULARIZATION
Anesthesia: LOCAL

## 2023-08-23 MED ORDER — POTASSIUM CHLORIDE CRYS ER 20 MEQ PO TBCR
40.0000 meq | EXTENDED_RELEASE_TABLET | Freq: Once | ORAL | Status: AC
Start: 2023-08-23 — End: 2023-08-23
  Administered 2023-08-23: 40 meq via ORAL
  Filled 2023-08-23: qty 2

## 2023-08-23 MED ORDER — LABETALOL HCL 5 MG/ML IV SOLN: 10.0000 mg | INTRAVENOUS | Status: AC | PRN

## 2023-08-23 MED ORDER — HEPARIN SODIUM (PORCINE) 1000 UNIT/ML IJ SOLN
INTRAMUSCULAR | Status: AC
Start: 2023-08-23 — End: ?
  Filled 2023-08-23: qty 10

## 2023-08-23 MED ORDER — TRAZODONE HCL 50 MG PO TABS: 50.0000 mg | ORAL_TABLET | Freq: Every evening | ORAL | Status: DC | PRN

## 2023-08-23 MED ORDER — TICAGRELOR 90 MG PO TABS
90.0000 mg | ORAL_TABLET | Freq: Two times a day (BID) | ORAL | Status: DC
  Administered 2023-08-24 – 2023-08-25 (×3): 90 mg via ORAL
  Filled 2023-08-23 (×3): qty 1

## 2023-08-23 MED ORDER — LOSARTAN POTASSIUM 25 MG PO TABS
25.0000 mg | ORAL_TABLET | Freq: Every day | ORAL | Status: DC
  Administered 2023-08-23 – 2023-08-25 (×3): 25 mg via ORAL
  Filled 2023-08-23 (×3): qty 1

## 2023-08-23 MED ORDER — HEPARIN SODIUM (PORCINE) 5000 UNIT/ML IJ SOLN
4000.0000 [IU] | Freq: Once | INTRAMUSCULAR | Status: AC
  Administered 2023-08-23: 4000 [IU] via INTRAVENOUS

## 2023-08-23 MED ORDER — TICAGRELOR 90 MG PO TABS
ORAL_TABLET | ORAL | Status: DC | PRN
  Administered 2023-08-23: 180 mg via ORAL

## 2023-08-23 MED ORDER — VERAPAMIL HCL 2.5 MG/ML IV SOLN
INTRAVENOUS | Status: DC | PRN
  Administered 2023-08-23: 10 mL via INTRA_ARTERIAL

## 2023-08-23 MED ORDER — SODIUM CHLORIDE 0.9% FLUSH
3.0000 mL | Freq: Two times a day (BID) | INTRAVENOUS | Status: DC
  Administered 2023-08-23 – 2023-08-25 (×4): 3 mL via INTRAVENOUS

## 2023-08-23 MED ORDER — SODIUM CHLORIDE 0.9% FLUSH: 10.0000 mL | Freq: Two times a day (BID) | INTRAVENOUS | Status: DC

## 2023-08-23 MED ORDER — PANTOPRAZOLE SODIUM 40 MG PO TBEC
40.0000 mg | DELAYED_RELEASE_TABLET | Freq: Every day | ORAL | Status: DC
  Administered 2023-08-23 – 2023-08-25 (×3): 40 mg via ORAL
  Filled 2023-08-23 (×3): qty 1

## 2023-08-23 MED ORDER — ASPIRIN 81 MG PO TBEC
81.0000 mg | DELAYED_RELEASE_TABLET | Freq: Every day | ORAL | Status: DC
  Administered 2023-08-24 – 2023-08-25 (×2): 81 mg via ORAL
  Filled 2023-08-23 (×2): qty 1

## 2023-08-23 MED ORDER — LIDOCAINE HCL (PF) 1 % IJ SOLN
INTRAMUSCULAR | Status: AC
  Filled 2023-08-23: qty 30

## 2023-08-23 MED ORDER — HYDROMORPHONE HCL 1 MG/ML IJ SOLN: 1.0000 mg | INTRAMUSCULAR | Status: DC | PRN

## 2023-08-23 MED ORDER — ASPIRIN 81 MG PO CHEW: 324.0000 mg | CHEWABLE_TABLET | Freq: Once | ORAL | Status: DC

## 2023-08-23 MED ORDER — SODIUM CHLORIDE 0.9% FLUSH: 3.0000 mL | INTRAVENOUS | Status: DC | PRN

## 2023-08-23 MED ORDER — NITROGLYCERIN IN D5W 200-5 MCG/ML-% IV SOLN
INTRAVENOUS | Status: AC
  Filled 2023-08-23: qty 250

## 2023-08-23 MED ORDER — HYDROMORPHONE HCL 1 MG/ML IJ SOLN
1.0000 mg | Freq: Once | INTRAMUSCULAR | Status: AC
  Administered 2023-08-23: 1 mg via INTRAVENOUS

## 2023-08-23 MED ORDER — AMITRIPTYLINE HCL 10 MG PO TABS: 10.0000 mg | ORAL_TABLET | Freq: Every day | ORAL | Status: DC

## 2023-08-23 MED ORDER — HEPARIN (PORCINE) IN NACL 1000-0.9 UT/500ML-% IV SOLN
INTRAVENOUS | Status: DC | PRN
  Administered 2023-08-23 (×2): 500 mL

## 2023-08-23 MED ORDER — SODIUM CHLORIDE 0.9 % IV SOLN
INTRAVENOUS | Status: AC | PRN
  Administered 2023-08-23: 100 mL/h via INTRAVENOUS

## 2023-08-23 MED ORDER — SODIUM CHLORIDE 0.9 % IV SOLN
INTRAVENOUS | Status: AC
Start: 2023-08-23 — End: 2023-08-24

## 2023-08-23 MED ORDER — ATORVASTATIN CALCIUM 80 MG PO TABS
80.0000 mg | ORAL_TABLET | Freq: Every day | ORAL | Status: DC
  Administered 2023-08-23 – 2023-08-25 (×3): 80 mg via ORAL
  Filled 2023-08-23 (×3): qty 1

## 2023-08-23 MED ORDER — VENLAFAXINE HCL ER 75 MG PO CP24
225.0000 mg | ORAL_CAPSULE | Freq: Every day | ORAL | Status: DC
  Administered 2023-08-24 – 2023-08-25 (×2): 225 mg via ORAL
  Filled 2023-08-23 (×3): qty 1

## 2023-08-23 MED ORDER — IOHEXOL 350 MG/ML SOLN
INTRAVENOUS | Status: DC | PRN
  Administered 2023-08-23: 125 mL

## 2023-08-23 MED ORDER — MORPHINE SULFATE (PF) 4 MG/ML IV SOLN
4.0000 mg | Freq: Once | INTRAVENOUS | Status: AC
  Administered 2023-08-23: 4 mg via INTRAVENOUS

## 2023-08-23 MED ORDER — FENTANYL CITRATE (PF) 100 MCG/2ML IJ SOLN
INTRAMUSCULAR | Status: DC | PRN
  Administered 2023-08-23 (×2): 25 ug via INTRAVENOUS

## 2023-08-23 MED ORDER — CHLORHEXIDINE GLUCONATE CLOTH 2 % EX PADS
6.0000 | MEDICATED_PAD | Freq: Every day | CUTANEOUS | Status: DC
  Administered 2023-08-23 – 2023-08-25 (×3): 6 via TOPICAL

## 2023-08-23 MED ORDER — VENLAFAXINE HCL ER 150 MG PO CP24
150.0000 mg | ORAL_CAPSULE | Freq: Every day | ORAL | Status: DC
  Filled 2023-08-23: qty 1

## 2023-08-23 MED ORDER — HYDRALAZINE HCL 20 MG/ML IJ SOLN: 10.0000 mg | INTRAMUSCULAR | Status: AC | PRN

## 2023-08-23 MED ORDER — VERAPAMIL HCL 2.5 MG/ML IV SOLN
INTRAVENOUS | Status: AC
Start: 2023-08-23 — End: ?
  Filled 2023-08-23: qty 2

## 2023-08-23 MED ORDER — LIDOCAINE HCL (PF) 1 % IJ SOLN
INTRAMUSCULAR | Status: DC | PRN
  Administered 2023-08-23: 2 mL via INTRADERMAL

## 2023-08-23 MED ORDER — CANGRELOR TETRASODIUM 50 MG IV SOLR
INTRAVENOUS | Status: AC
  Filled 2023-08-23: qty 50

## 2023-08-23 MED ORDER — SODIUM CHLORIDE 0.9 % IV SOLN
INTRAVENOUS | Status: DC | PRN
  Administered 2023-08-23: 4 ug/kg/min via INTRAVENOUS

## 2023-08-23 MED ORDER — ORAL CARE MOUTH RINSE: 15.0000 mL | OROMUCOSAL | Status: DC | PRN

## 2023-08-23 MED ORDER — HEPARIN SODIUM (PORCINE) 1000 UNIT/ML IJ SOLN
INTRAMUSCULAR | Status: DC | PRN
  Administered 2023-08-23: 5000 [IU] via INTRAVENOUS

## 2023-08-23 MED ORDER — ONDANSETRON HCL 4 MG/2ML IJ SOLN: 4.0000 mg | Freq: Four times a day (QID) | INTRAMUSCULAR | Status: DC | PRN

## 2023-08-23 MED ORDER — NITROGLYCERIN IN D5W 200-5 MCG/ML-% IV SOLN: 0.0000 ug/min | INTRAVENOUS | Status: DC

## 2023-08-23 MED ORDER — TICAGRELOR 90 MG PO TABS
ORAL_TABLET | ORAL | Status: AC
  Filled 2023-08-23: qty 2

## 2023-08-23 MED ORDER — MORPHINE SULFATE (PF) 2 MG/ML IV SOLN
INTRAVENOUS | Status: AC
  Filled 2023-08-23: qty 2

## 2023-08-23 MED ORDER — BUPROPION HCL ER (XL) 150 MG PO TB24: 150.0000 mg | ORAL_TABLET | Freq: Every day | ORAL | Status: DC

## 2023-08-23 MED ORDER — FENTANYL CITRATE (PF) 100 MCG/2ML IJ SOLN
INTRAMUSCULAR | Status: AC
  Filled 2023-08-23: qty 2

## 2023-08-23 MED ORDER — ACETAMINOPHEN 325 MG PO TABS
650.0000 mg | ORAL_TABLET | ORAL | Status: DC | PRN
Start: 2023-08-23 — End: 2023-08-25
  Administered 2023-08-24 – 2023-08-25 (×3): 650 mg via ORAL
  Filled 2023-08-23 (×3): qty 2

## 2023-08-23 MED ORDER — SODIUM CHLORIDE 0.9 % IV SOLN: 250.0000 mL | INTRAVENOUS | Status: DC | PRN

## 2023-08-23 MED ORDER — HYDROMORPHONE HCL 1 MG/ML IJ SOLN
INTRAMUSCULAR | Status: AC
  Filled 2023-08-23: qty 1

## 2023-08-23 MED ORDER — CANGRELOR BOLUS VIA INFUSION
INTRAVENOUS | Status: DC | PRN
  Administered 2023-08-23: 2952 ug via INTRAVENOUS

## 2023-08-23 MED ORDER — ASPIRIN 81 MG PO CHEW: 81.0000 mg | CHEWABLE_TABLET | Freq: Every day | ORAL | Status: DC

## 2023-08-23 MED ORDER — MIDAZOLAM HCL 2 MG/2ML IJ SOLN
INTRAMUSCULAR | Status: DC | PRN
  Administered 2023-08-23: 2 mg via INTRAVENOUS

## 2023-08-23 MED ORDER — MIDAZOLAM HCL 2 MG/2ML IJ SOLN
INTRAMUSCULAR | Status: AC
  Filled 2023-08-23: qty 2

## 2023-08-23 SURGICAL SUPPLY — 16 items
BALLN EMERGE MR 2.5X12 (BALLOONS) ×1
BALLOON EMERGE MR 2.5X12 (BALLOONS) IMPLANT
CATH INFINITI 5FR ANG PIGTAIL (CATHETERS) IMPLANT
CATH INFINITI AMBI 5FR TG (CATHETERS) IMPLANT
CATH INFINITI JR4 5F (CATHETERS) IMPLANT
CATH LAUNCHER 6FR EBU3.5 (CATHETERS) IMPLANT
DEVICE RAD COMP TR BAND LRG (VASCULAR PRODUCTS) IMPLANT
GLIDESHEATH SLEND SS 6F .021 (SHEATH) IMPLANT
GUIDEWIRE INQWIRE 1.5J.035X260 (WIRE) IMPLANT
INQWIRE 1.5J .035X260CM (WIRE) ×1
KIT ENCORE 26 ADVANTAGE (KITS) IMPLANT
PACK CARDIAC CATHETERIZATION (CUSTOM PROCEDURE TRAY) ×1 IMPLANT
SET ATX-X65L (MISCELLANEOUS) IMPLANT
SHEATH PROBE COVER 6X72 (BAG) IMPLANT
STENT ONYX FRONTIER 2.5X18 (Permanent Stent) IMPLANT
WIRE ASAHI PROWATER 180CM (WIRE) IMPLANT

## 2023-08-23 NOTE — ED Provider Notes (Signed)
[place] EMERGENCY DEPARTMENT AT [facility] Provider Note   CSN: [id number] Arrival date & time: 08/23/23  1620     History {Add pertinent medical, surgical, social history, OB history to HPI:1} Chief Complaint  Patient presents with   Code STEMI    Alicia Rosario is a 42 y.o. female.  HPI Patient has history of hypertension.  She developed chest pain at about 2 PM.  The pain is severe and central.  Burning and radiating through the epigastrium chest and back.  Patient denies history of similar pain.  She reports she does feel short of breath and nauseated.  She tried to wait the pain out a while but it kept coming back and getting worse again.  EMS was called.  EMS identified ST segment elevation inferiorly and activated code STEMI.  Patient was given aspirin and 2 sublingual nitroglycerin prior to arrival.  She did not experience any relief.  Patient does smoke marijuana.  Denies other drug use.  Denies recent fever cough or exertional chest pain.    Home Medications Prior to Admission medications   Medication Sig Start Date End Date Taking? Authorizing Provider  amitriptyline (ELAVIL) 10 MG tablet Take 1 tablet (10 mg total) by mouth at bedtime. 02/19/21   Hoy Register, MD  buPROPion (WELLBUTRIN XL) 150 MG 24 hr tablet Take 1 tablet (150 mg total) by mouth daily. 02/19/21   Hoy Register, MD  famotidine (PEPCID) 20 MG tablet Take 20 mg by mouth daily.    [provider]  omeprazole (PRILOSEC) 20 MG capsule Take 1 capsule (20 mg total) by mouth daily. 12/02/20 01/01/21  Carroll Sage, PA-C  traZODone (DESYREL) 50 MG tablet Take 1 tablet (50 mg total) by mouth at bedtime as needed for sleep. 02/19/21   Hoy Register, MD  venlafaxine XR (EFFEXOR XR) 150 MG 24 hr capsule Take 1 capsule (150 mg total) by mouth daily with breakfast. 08/12/21 10/11/21  Cathlyn Parsons, NP      Allergies    Patient has no known allergies.    Review of Systems   Review of  Systems  Physical Exam Updated Vital Signs BP 113/89   Pulse 90   Temp 98.2 F (36.8 C) (Axillary)   Resp 17   SpO2 100%  Physical Exam Constitutional:      Comments: Patient is alert.  Nontoxic.  Very uncomfortable in appearance.  No respiratory distress.  HENT:     Mouth/Throat:     Pharynx: Oropharynx is clear.  Eyes:     Extraocular Movements: Extraocular movements intact.  Cardiovascular:     Rate and Rhythm: Normal rate and regular rhythm.  Pulmonary:     Effort: Pulmonary effort is normal.     Breath sounds: Normal breath sounds.  Abdominal:     Palpations: Abdomen is soft.     Comments: Patient endorses some epigastric pain to palpation.  No guarding.  Musculoskeletal:        General: No swelling or tenderness. Normal range of motion.     Right lower leg: No edema.     Left lower leg: No edema.  Skin:    General: Skin is warm and dry.  Neurological:     General: No focal deficit present.     Mental Status: She is oriented to person, place, and time.     Motor: No weakness.     Coordination: Coordination normal.  Psychiatric:     Comments: Very anxious and tearful  ED Results / Procedures / Treatments   Labs (all labs ordered are listed, but only abnormal results are displayed) Labs Reviewed  CBC WITH DIFFERENTIAL/PLATELET - Abnormal; Notable for the following components:      Result Value   RBC 5.31 (*)    MCV 74.0 (*)    MCH 24.9 (*)    All other components within normal limits  PROTIME-INR  APTT  COMPREHENSIVE METABOLIC PANEL  LIPID PANEL  HCG, SERUM, QUALITATIVE  URINALYSIS, ROUTINE W REFLEX MICROSCOPIC  RAPID URINE DRUG SCREEN, HOSP PERFORMED  LIPASE, BLOOD  I-STAT CG4 LACTIC ACID, ED  TROPONIN I (HIGH SENSITIVITY)    EKG EKG Interpretation Date/Time:  Sunday August 23 2023 16:28:20 EDT Ventricular Rate:  90 PR Interval:  151 QRS Duration:  99 QT Interval:  360 QTC Calculation: 441 R Axis:   -13  Text Interpretation: Sinus  rhythm Abnormal R-wave progression, early transition Inferior infarct, acute (RCA) Lateral leads are also involved Probable RV involvement, suggest recording right precordial leads >>> Acute MI <<< st elevation compared to older tracings Confirmed by Arby Barrette [phone]) on 08/23/2023 4:49:55 PM  Radiology No results found.  Procedures Procedures  {Document cardiac monitor, telemetry assessment procedure when appropriate:1} CRITICAL CARE Performed by: Arby Barrette   Total critical care time: 30 minutes  Critical care time was exclusive of separately billable procedures and treating other patients.  Critical care was necessary to treat or prevent imminent or life-threatening deterioration.  Critical care was time spent personally by me on the following activities: development of treatment plan with patient and/or surrogate as well as nursing, discussions with consultants, evaluation of patient's response to treatment, examination of patient, obtaining history from patient or surrogate, ordering and performing treatments and interventions, ordering and review of laboratory studies, ordering and review of radiographic studies, pulse oximetry and re-evaluation of patient's condition.  Medications Ordered in ED Medications  aspirin chewable tablet 324 mg ( Oral MAR Hold 08/23/23 1652)  sodium chloride flush (NS) 0.9 % injection 10 mL ( Intravenous Automatically Held 08/31/23 2200)  nitroGLYCERIN 0.2 mg/mL in dextrose 5 % infusion (has no administration in time range)  nitroGLYCERIN 50 mg in dextrose 5 % 250 mL (0.2 mg/mL) infusion ( Intravenous MAR Hold 08/23/23 1652)  morphine (PF) 4 MG/ML injection 4 mg (4 mg Intravenous Given 08/23/23 1628)  heparin injection 4,000 Units (4,000 Units Intravenous Given 08/23/23 1645)  HYDROmorphone (DILAUDID) injection 1 mg (1 mg Intravenous Given 08/23/23 1638)    ED Course/ Medical Decision Making/ A&P   {   Click here for ABCD2, HEART and other  calculatorsREFRESH Note before signing :1}                              Medical Decision Making Amount and/or Complexity of Data Reviewed Labs: ordered. Radiology: ordered.  Risk OTC drugs. Prescription drug management.   Patient presents as outlined.  No known cardiac history.  Transmitted EKG from EMS shows inferior ST elevation concerning for acute MI.  On arrival patient exhibits a lot of pain.  She is alert with clear mental status and no respiratory distress although hyperventilating.  Patient had aspirin prior to arrival and nitroglycerin without significant improvement.  Patient given morphine and sublingual nitroglycerin for pain control.  No significant subjective improvement.  Bedside portable chest x-ray reviewed by myself, no mediastinal widening.  No appearance of pneumothorax or focal consolidations.  Possible mild vascular congestion but no  significant acute findings.  Dr. Herbie Baltimore of cardiology is at bedside evaluating the patient.  He has reviewed EKG and concerns for ACS and cardiac catheterization.  Patient was given additional dose of Dilaudid for pain control.  She did get some improvement with Dilaudid but still somewhat uncomfortable.  I-STAT lactic 1.4.  White count 8.4 H&H 13.2 and 39.  Other labs still pending.  Will Continue to observe closely for vital sign stability and pain control.  Anticipate patient will be going to Cath Lab.  {Document critical care time when appropriate:1} {Document review of labs and clinical decision tools ie heart score, Chads2Vasc2 etc:1}  {Document your independent review of radiology images, and any outside records:1} {Document your discussion with family members, caretakers, and with consultants:1} {Document social determinants of health affecting pt's care:1} {Document your decision making why or why not admission, treatments were needed:1} Final Clinical Impression(s) / ED Diagnoses Final diagnoses:  ACS (acute coronary  syndrome) (HCC)    Rx / DC Orders ED Discharge Orders     None

## 2023-08-23 NOTE — ED Triage Notes (Signed)
Pot BIB [facility] as a code STEMI. Hx HTN. Pt said center cp described as pressure started at 2pm today. Ems 324 asa and 2 NITROGLYCERIN. And 4 zofran w no relief. Pt smokes weeds.   BP 150/100 HR 100  99% RA

## 2023-08-23 NOTE — Brief Op Note (Signed)
BRIEF CARDIAC CATHETERIZATION/PCI-ACUTE REVASCULARIZATION NOTE  NAME: Alicia Rosario    MRN: [id number] 08/23/2023; 5:51 PM  PCP: Pcp, No Shon Baton, PA-[facility] CARDIOLOGIST: Bryan Lemma, MD (new)  SURGEON:  Surgeons and Role:   * Marykay Lex, MD - Primary  PROCEDURE:  Procedure(s): Coronary/Graft Acute MI Revascularization (N/A)  PATIENT:  Alicia Rosario is a obese 42 y.o. female with PMH notable for hypertension and GERD who presented via EMS to [facility], ER on the afternoon of 08/23/2023.  She began having chest pain roughly 2-30.  Symptoms were significant and very worrisome to her.  She Promus her husband to call EMS.  Upon EMS arrival, she was having significant 7/10 chest pain but initial EKG was nondiagnostic.  Pain worsened to 9/10 and repeat EKG showed ST elevations in 2 3 and aVF concerning for inferior STEMI.  Code STEMI was called.  Upon arrival to the ER she was still having significant pain but repeat EKG showed less prominent ST elevations but still noted reciprocal changes in aVL and V1, V2.  She was given 2 doses of sublingual NTG by EMS with no improvement of symptoms.  Based on her clinical presentation I felt it prudent to take her to the Cath Lab as an inferior STEMI.  She was still having 9/10 chest pain after receiving 4000 units IV heparin, 4 mg IV morphine and 1 mg IV Dilaudid as well as 4 mg Zofran in the ER.  PRE-OPERATIVE DIAGNOSIS: Inferior STEMI  POST-OPERATIVE DIAGNOSIS:  Severe single-vessel CAD with culprit lesion being 90% subtotal occlusion of distal LCx into OM 2 crossing AV groove LCx with TIMI I flow and dye staining. Successful DES PCI revascularization of distal LCx into OM 3 using Onyx frontier DES 2.5 mm x 18 mm deployed to 16 ATM x 30 seconds.  Did not require postdilation as this was adequate postdilation to 2.6 mm.  TIMI-3 flow restored. Otherwise no significant CAD, 40% mid  RCA and minimal luminal irregularities in the LAD with a large bifurcating diagonal branch. Preserved LVEF with mild inferolateral hypokinesis on LV gram.  LVEDP initially was 28 mmHg, post PCI reduced to 17 mmHg.  PROCEDURE PERFORMED Time Out: Verified patient identification, verified procedure, site/side was marked, verified correct patient position, special equipment/implants available, medications/allergies/relevent history reviewed, required imaging and test results available. Performed.  Access:  *RIGHT Radial Artery: 6 Fr sheath -- Seldinger technique using Micropuncture Kit -- Direct ultrasound guidance used.  Permanent image obtained and placed on chart. -- 10 mL radial cocktail IA; 5000 Units IV Heparin  Left Heart Catheterization: 5 &6 Fr Catheters advanced or exchanged over a J-wire under direct fluoroscopic guidance into the ascending aorta; TIG 4.0 catheter advanced first.  * LV Hemodynamics (LV Gram): LV hemodynamics with TIG 4.0 catheter, LV gram with angled pigtail catheter post PCI * Left Coronary Artery Cineangiography: TIG 4.0 catheter; exchange for 6 [place] EBU guide catheter for PCI * Right Coronary Artery Cineangiography: TIG 4.0 catheter, then JR4 catheter post PCI for better imaging.  Review of initial angiography revealed: Culprit lesion being subtotal occlusion of the distal LCx and AV groove/OM 3  Preparations are made for PCI of the LCx -> Brilinta 180 mg administered, ACT> 250 sec confirmed  PCI performed: See FINDINGS 6 French EBU 3.5 guide catheter, Prowater wire, predilation with 2.5 mm x 12 mm balloon 8 ATM x 20 seconds,  DES PCI: Onyx frontier DES 2.5 mm x 18 mm  deployed at 16 ATM x 30 seconds => TIMI-3 flow restored.  Adequately deployed to 2.6 mm.  Upon completion of post PCI Angiogaphy, additional images of the RCA were performed with a JR4 catheter followed by left ventriculography with the angled pigtail catheter.  Finally, the catheter was removed  completely out of the body over a wire, without complication.  Radial sheath removed in the Cardiac Catheterization lab with TR Band placed for hemostasis.  TR Band: 1745 Hours; 12 mL air; reverse Barbeau C  MEDICATIONS SQ Lidocaine 4 mL Radial Cocktail: 3 mg Verapmil in 10 mL NS Heparin: 5000 units (was given 4000 units in the ER) IV NS bolus 500 mL Cangrelor bolus and drip administered to allow for PCI given extensive preprocedure Brilinta 180 mg p.o.   ANESTHESIA:   local and IV sedation; 4 mL SQ lidocaine, in the Cath Lab was given 2 mg IV Versed and 50 mcg fentanyl (in the ER and received 4 mg morphine and 1 mg Dilaudid)  EBL:  <50 mL   COUNTS:  YES  PATIENT DISPOSITION:  ICU - extubated and stable.  DICTATION: .Note written in EPIC  PLAN OF CARE: Admit to inpatient  -> Check 2D echo.  If doing well with no major issues could be early discharge probably 24 hours.    Delay start of Pharmacological VTE agent (>24hrs) due to surgical blood loss or risk of bleeding: no    Bryan Lemma, MD

## 2023-08-23 NOTE — H&P (Addendum)
Cardiology Admission History and Physical   Patient ID: Alicia Rosario MRN: [id number]; DOB: Dec 30, 1980   Admission date: 08/23/2023  PCP:  Drenda Freeze   [facility] Providers Cardiologist:  None       Chief Complaint: Severe central chest pain  Patient Profile:   Alicia Rosario is a obese 42 y.o. female with PMH notable for hypertension and GERD who is being seen 08/23/2023 for the evaluation of severe chest pain with ST elevations on EKG by EMS-code STEMI.  History of Present Illness:   Alicia Rosario was in her usual state of health until roughly 2:00 in the afternoon of 08/23/2023 when she started having left-sided chest discomfort radiating to left arm that was initially modest roughly 6-7/10.  The pain did not go away and was radiating down the left arm.  She and her husband discussed that they decided to call EMS.  Upon initial evaluation by EMS, her EKG was nondiagnostic, however when pain worsened to 9/10 there was evidence of ST elevations in II,III and aVF with depressions in I, and aVF as well as septal leads V1 and V2.  She was given 4 baby aspirin by her husband who works at a pharmacy and has not [employer] manager.  EMS gave her 2 of sublingual nitroglycerin with no resolution of pain.  In the ER she was still having pain, but her EKG was less diagnostic with only subtle ST elevations in II, 3 and aVF.  She was given 4 mg morphine with no relief, but did have some relief with 1 of Dilaudid.  Given her presentation concerning for ACS with at least dynamic ST changes, I chose to take her to the Cath Lab as a possible inferior STEMI.  CV ROS Cardiovascular ROS: positive for - chest pain, dyspnea on exertion, shortness of breath, and pain radiates to the left arm.  Getting worse.  Noted lightheadedness.  Had a headache with nitroglycerin. negative for - edema, irregular heartbeat, orthopnea, paroxysmal nocturnal dyspnea, rapid heart rate, or syncope/near syncope  or TIA/amaurosis fugax, claudication .  Prior to today, she had not had any symptoms at all.  He is taking medications including amlodipine 10 mg and losartan 25 mg for blood pressure, omeprazole for GERD, Wellbutrin for bipolar as well as Elavil/trazodone as needed for sleep.   Past Medical History:  Diagnosis Date   Bipolar II disorder (HCC)    Depression    Hypertension    Irritable bowel syndrome (IBS)     Past Surgical History:  Procedure Laterality Date   CESAREAN SECTION N/A 02/17/2017   Procedure: CESAREAN SECTION;  Surgeon: Adam Phenix, MD;  Location: [facility] BIRTHING SUITES;  Service: Obstetrics;  Laterality: N/A;   CHOLECYSTECTOMY     MOUTH SURGERY       Medications Prior to Admission: Prior to Admission medications   Medication Sig Start Date End Date Taking? Authorizing Provider  amitriptyline (ELAVIL) 10 MG tablet Take 1 tablet (10 mg total) by mouth at bedtime. 02/19/21   Hoy Register, MD  buPROPion (WELLBUTRIN XL) 150 MG 24 hr tablet Take 1 tablet (150 mg total) by mouth daily. 02/19/21   Hoy Register, MD  famotidine (PEPCID) 20 MG tablet Take 20 mg by mouth daily.    [provider]  omeprazole (PRILOSEC) 20 MG capsule Take 1 capsule (20 mg total) by mouth daily. 12/02/20 01/01/21  Carroll Sage, PA-C  traZODone (DESYREL) 50 MG tablet Take 1 tablet (50 mg total)  by mouth at bedtime as needed for sleep. 02/19/21   Hoy Register, MD  venlafaxine XR (EFFEXOR XR) 150 MG 24 hr capsule Take 1 capsule (150 mg total) by mouth daily with breakfast. 08/12/21 10/11/21  Cathlyn Parsons, NP     Allergies:   No Known Allergies  Social History:   Social History   Socioeconomic History   Marital status: Married    Spouse name: Not on file   Number of children: Not on file   Years of education: Not on file   Highest education level: Not on file  Occupational History   Not on file  Tobacco Use   Smoking status: Former   Smokeless tobacco: Never  Vaping Use    Vaping status: Never Used  Substance and Sexual Activity   Alcohol use: No    Alcohol/week: 3.0 standard drinks of alcohol    Types: 3 Standard drinks or equivalent per week    Comment: social   Drug use: No   Sexual activity: Yes    Birth control/protection: I.U.D.  Other Topics Concern   Not on file  Social History Narrative   Not on file   Social Determinants of Health   Financial Resource Strain: Not on file  Food Insecurity: Food Insecurity Present (09/02/2022)   [facility] to [facility] visits prior to 12/27/2022.   Hunger Vital Sign    Worried About Running Out of Food in the Last Year: Often true    Ran Out of Food in the Last Year: Sometimes true  Transportation Needs: Unmet Transportation Needs (09/02/2022)   Received from [facility] visits prior to 12/27/2022., [facility] visits prior to 12/27/2022.   PRAPARE - Administrator, Civil Service (Medical): Yes    Lack of Transportation (Non-Medical): Yes  Physical Activity: Not on file  Stress: Not on file  Social Connections: Not on file  Intimate Partner Violence: Not on file    Family History:   The patient's family history includes Cancer in her paternal grandmother; Hyperlipidemia in her father; Mental illness in her mother.    ROS:  Please see the history of present illness.  Review of Systems - Negative except notably anxious severe pain today.  Some nausea associated with her chest pain.   All other ROS reviewed and negative.     Physical Exam/Data:   Vitals:   08/23/23 1654 08/23/23 1700 08/23/23 1800 08/23/23 1830  BP:    111/84  Pulse:    93  Resp:    18  Temp:   97.9 F (36.6 C)   TempSrc:   Oral   SpO2: 100%   100%  Weight:  98.4 kg    Height:  5\' 4"  (1.626 m)     No intake or output data in the 24 hours ending 08/23/23 1849     08/23/2023    5:00 PM 03/10/2022    1:06 PM 02/19/2021   10:31 AM  Last 3 Weights  Weight (lbs) 217 lb 231 lb 217 lb 9.6 oz  Weight (kg) 98.431 kg 104.781 kg 98.703 kg     Body mass index is 37.25 kg/m.  General:  Well nourished, well developed, in significant acute distress; morbidly obese HEENT: normal Neck: no JVD Vascular: No carotid bruits; Distal pulses 2+ bilaterally   Cardiac: RRR; no distant heart sounds but sounds like  normal S1, S2; no murmur  Lungs:  clear to auscultation bilaterally, no wheezing, rhonchi or rales-distant breath sounds due to body habitus Abd: soft, nontender, no hepatomegaly  Ext: no clubbing/cyanosis or edema Musculoskeletal:  No deformities, BUE and BLE strength normal and equal Skin: warm and dry  Neuro:  CNs 2-12 intact, no focal abnormalities noted Psych:  Normal affect    EKG:  The ECG that was done by EMS was personally reviewed and demonstrates sinus rhythm with 1.5 to 2 mm ST elevations in I, II and aVF with ST depressions in the lateral and precordial leads .  Repeat EKG here at [facility] rhythm, 90 bpm less prominent 1 to 1.5 mm ST elevations in [place] 1, normal in 2 and aVF, with ST depressions only in aVF and V2.  Relevant CV Studies: No prior cardiac evaluations  Laboratory Data:-Data reviewed following PCI  High Sensitivity Troponin:   Recent Labs  Lab 08/23/23 1629  TROPONINIHS 17      Chemistry Recent Labs  Lab 08/23/23 1629 08/23/23 1712  NA 139 140  K 3.4* 3.1*  CL 107 103  CO2 22  --   GLUCOSE 107* 123*  BUN 9 8  CREATININE 0.71 0.60  CALCIUM 8.8*  --   GFRNONAA >60  --   ANIONGAP 10  --     Recent Labs  Lab 08/23/23 1629  PROT 7.3  ALBUMIN 3.7  AST 17  ALT 18  ALKPHOS 65  BILITOT 0.3   Lipids  Recent Labs  Lab 08/23/23 1629  CHOL 155  TRIG 95  HDL 35*  LDLCALC 101*  CHOLHDL 4.4   Hematology Recent Labs  Lab 08/23/23 1629 08/23/23 1712  WBC 8.4  --   RBC 5.31*  --   HGB 13.2 12.9   HCT 39.3 38.0  MCV 74.0*  --   MCH 24.9*  --   MCHC 33.6  --   RDW 13.2  --   PLT 259  --    Thyroid No results for input(s): "TSH", "FREET4" in the last 168 hours. BNPNo results for input(s): "BNP", "PROBNP" in the last 168 hours.  DDimer No results for input(s): "DDIMER" in the last 168 hours.   Radiology/Studies:  DG Chest Portable 1 View  Result Date: 08/23/2023 CLINICAL DATA:  Myocardial infarction. EXAM: PORTABLE CHEST 1 VIEW COMPARISON:  None Available. FINDINGS: Normal mediastinum and cardiac silhouette. Normal pulmonary vasculature. No evidence of effusion, infiltrate, or pneumothorax. No acute bony abnormality. IMPRESSION: No acute cardiopulmonary process. Electronically Signed   By: Genevive Bi M.D.   On: 08/23/2023 17:08     Assessment and Plan:   Principal Problem:   Acute ST elevation myocardial infarction (STEMI) of inferior wall (HCC) Active Problems:   Coronary artery disease involving native coronary artery of native heart with unstable angina pectoris (HCC)   Presence of drug coated stent in left circumflex coronary artery   Essential (primary) hypertension   Hyperlipidemia with target low density lipoprotein (LDL) cholesterol less than 55 mg/dL   MDD (major depressive disorder)   GAD (generalized anxiety disorder)  Principal Problem:   Acute ST elevation myocardial infarction (STEMI) of inferior wall (HCC) Coronary artery 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  Procedure.  Was bolused with Brilinta 180 mg p.o., will continue 90 mg p.o. twice daily along with ASA 81 mg DAPT x 1 year Gentle post cath hydration Check 2D Echo to assess EF Initiate high-dose  statin (LDL 101) Target < 55 Borderline BP-holding home meds until tomorrow, hold amlodipine pending determination of using beta-blocker over a calcium channel blocker in setting of MI/CAD.      Essential (primary) hypertension Was borderline hypotensive in the Cath Lab, now more stable. Will hold off on reinitiating BP meds but will start losartan tomorrow Reassess pressures in the morning to see if she would benefit from beta-blocker in addition to ARB. Hold home dose amlodipine    Hyperlipidemia with target low density lipoprotein (LDL) cholesterol less than 55 mg/dL Not previously diagnosed.  Will start atorvastatin 80 mg daily and reassess in 3 months    MDD (major depressive disorder) /  GAD (generalized anxiety disorder) Had significant anxiety upon initial presentation.  Feeling much better post PCI. Continue home dose Wellbutrin as well as as needed sleep aids (uses amitriptyline and trazodone)    Dispo: Relatively stable hemodynamically PCI involving a distal LCx-OM 3 branch.  Pending assessment of EF and monitoring overnight, anticipate she could be ready for discharge within 24 hours.  Determine appropriateness in the morning 08/16/2023.     Risk Assessment/Risk Scores:    TIMI Risk Score for ST  Elevation MI:   The patient's TIMI risk score is 1, which indicates a 1.6% risk of all cause mortality at 30 days.     Code Status: Full Code  Severity of Illness: The appropriate patient status for this patient is INPATIENT. Inpatient status is judged to be reasonable and necessary in order to provide the required intensity of service to ensure the patient's safety. The patient's presenting symptoms, physical exam findings, and initial radiographic and laboratory data in the context of their chronic comorbidities is felt to place them at high risk for further clinical deterioration. Furthermore, it is not anticipated that the patient will be medically stable for discharge from the  hospital within 2 midnights of admission.   * I certify that at the point of admission it is my clinical judgment that the patient will require inpatient hospital care spanning beyond 2 midnights from the point of admission due to high intensity of service, high risk for further deterioration and high frequency of surveillance required.*   For questions or updates, please contact [facility] Please consult [email or web address] for contact info under       Signed, Bryan Lemma, MD  08/23/2023 6:49 PM

## 2023-08-24 ENCOUNTER — Other Ambulatory Visit (HOSPITAL_COMMUNITY): Payer: Self-pay

## 2023-08-24 ENCOUNTER — Inpatient Hospital Stay (HOSPITAL_COMMUNITY): Payer: Medicaid Other

## 2023-08-24 ENCOUNTER — Encounter (HOSPITAL_COMMUNITY): Payer: Self-pay | Admitting: Cardiology

## 2023-08-24 DIAGNOSIS — I2119 ST elevation (STEMI) myocardial infarction involving other coronary artery of inferior wall: Secondary | ICD-10-CM | POA: Diagnosis not present

## 2023-08-24 DIAGNOSIS — F411 Generalized anxiety disorder: Secondary | ICD-10-CM | POA: Diagnosis not present

## 2023-08-24 DIAGNOSIS — F322 Major depressive disorder, single episode, severe without psychotic features: Secondary | ICD-10-CM

## 2023-08-24 DIAGNOSIS — I10 Essential (primary) hypertension: Secondary | ICD-10-CM | POA: Diagnosis not present

## 2023-08-24 DIAGNOSIS — I25110 Atherosclerotic heart disease of native coronary artery with unstable angina pectoris: Secondary | ICD-10-CM | POA: Diagnosis not present

## 2023-08-24 LAB — LIPID PANEL
Cholesterol: 131 mg/dL (ref 0–200)
HDL: 32 mg/dL — ABNORMAL LOW (ref >40)
LDL Cholesterol: 86 mg/dL (ref 0–99)
Total CHOL/HDL Ratio: 4.1 {ratio}
Triglycerides: 63 mg/dL (ref <150)
VLDL: 13 mg/dL (ref 0–40)

## 2023-08-24 LAB — BASIC METABOLIC PANEL
Anion gap: 6 (ref 5–15)
BUN: 5 mg/dL — ABNORMAL LOW (ref 6–20)
CO2: 21 mmol/L — ABNORMAL LOW (ref 22–32)
Calcium: 8.2 mg/dL — ABNORMAL LOW (ref 8.9–10.3)
Chloride: 107 mmol/L (ref 98–111)
Creatinine, Ser: 0.64 mg/dL (ref 0.44–1.00)
GFR, Estimated: >60 mL/min (ref >60)
Glucose, Bld: 89 mg/dL (ref 70–99)
Potassium: 3.6 mmol/L (ref 3.5–5.1)
Sodium: 134 mmol/L — ABNORMAL LOW (ref 135–145)

## 2023-08-24 LAB — ECHOCARDIOGRAM COMPLETE
AR max vel: 2.12 cm2
AV Area VTI: 2.35 cm2
AV Area mean vel: 1.88 cm2
AV Mean grad: 3 mm[Hg]
AV Peak grad: 5.1 mm[Hg]
Ao pk vel: 1.13 m/s
Area-P 1/2: 4.68 cm2
Height: 64 in
S' Lateral: 3.9 cm
Weight: 3472 [oz_av]

## 2023-08-24 LAB — IRON AND TIBC
Iron: 50 ug/dL (ref 28–170)
Saturation Ratios: 18 % (ref 10.4–31.8)
TIBC: 273 ug/dL (ref 250–450)
UIBC: 223 ug/dL

## 2023-08-24 LAB — CBC
HCT: 36.3 % (ref 36.0–46.0)
Hemoglobin: 12.2 g/dL (ref 12.0–15.0)
MCH: 24.8 pg — ABNORMAL LOW (ref 26.0–34.0)
MCHC: 33.6 g/dL (ref 30.0–36.0)
MCV: 73.8 fL — ABNORMAL LOW (ref 80.0–100.0)
Platelets: 242 10*3/uL (ref 150–400)
RBC: 4.92 MIL/uL (ref 3.87–5.11)
RDW: 13.2 % (ref 11.5–15.5)
WBC: 10.2 10*3/uL (ref 4.0–10.5)
nRBC: 0 % (ref 0.0–0.2)

## 2023-08-24 LAB — TROPONIN I (HIGH SENSITIVITY): Troponin I (High Sensitivity): >24000 ng/L (ref <18)

## 2023-08-24 MED ORDER — METOPROLOL TARTRATE 12.5 MG HALF TABLET
12.5000 mg | ORAL_TABLET | Freq: Two times a day (BID) | ORAL | Status: DC
  Administered 2023-08-24 (×2): 12.5 mg via ORAL
  Filled 2023-08-24 (×2): qty 1

## 2023-08-24 NOTE — Plan of Care (Signed)
  Problem: Clinical Measurements: Goal: Ability to maintain clinical measurements within normal limits will improve Outcome: Progressing Goal: Will remain free from infection Outcome: Progressing Goal: Diagnostic test results will improve Outcome: Progressing Goal: Respiratory complications will improve Outcome: Progressing Goal: Cardiovascular complication will be avoided Outcome: Progressing   Problem: Activity: Goal: Risk for activity intolerance will decrease Outcome: Progressing   Problem: Nutrition: Goal: Adequate nutrition will be maintained Outcome: Progressing   Problem: Coping: Goal: Level of anxiety will decrease Outcome: Progressing   Problem: Elimination: Goal: Will not experience complications related to urinary retention Outcome: Completed/Met   Problem: Pain Management: Goal: General experience of comfort will improve Outcome: Progressing   Problem: Safety: Goal: Ability to remain free from injury will improve Outcome: Progressing   Problem: Skin Integrity: Goal: Risk for impaired skin integrity will decrease Outcome: Progressing   Problem: Activity: Goal: Ability to return to baseline activity level will improve Outcome: Progressing   Problem: Cardiovascular: Goal: Ability to achieve and maintain adequate cardiovascular perfusion will improve Outcome: Progressing Goal: Vascular access site(s) Level 0-1 will be maintained Outcome: Progressing   Problem: Health Behavior/Discharge Planning: Goal: Ability to safely manage health-related needs after discharge will improve Outcome: Progressing

## 2023-08-24 NOTE — Plan of Care (Signed)
  Problem: Education: Goal: Knowledge of General Education information will improve Description: Including pain rating scale, medication(s)/side effects and non-pharmacologic comfort measures Outcome: Progressing   Problem: Health Behavior/Discharge Planning: Goal: Ability to manage health-related needs will improve Outcome: Progressing   Problem: Clinical Measurements: Goal: Ability to maintain clinical measurements within normal limits will improve Outcome: Progressing Goal: Will remain free from infection Outcome: Progressing Goal: Diagnostic test results will improve Outcome: Progressing Goal: Respiratory complications will improve Outcome: Progressing Goal: Cardiovascular complication will be avoided Outcome: Progressing   Problem: Activity: Goal: Risk for activity intolerance will decrease Outcome: Progressing   Problem: Coping: Goal: Level of anxiety will decrease Outcome: Progressing   Problem: Elimination: Goal: Will not experience complications related to bowel motility Outcome: Progressing   Problem: Safety: Goal: Ability to remain free from injury will improve Outcome: Progressing   Problem: Skin Integrity: Goal: Risk for impaired skin integrity will decrease Outcome: Progressing

## 2023-08-24 NOTE — Progress Notes (Signed)
Report call to the receiving RN on 6E. Pt transferred to 6E25 via wheelchair. Accompanied by RN and family member. All belonging sent with pt. Receiving RN at bedside.

## 2023-08-24 NOTE — Progress Notes (Signed)
CARDIAC REHAB PHASE I   PRE:  Rate/Rhythm: 76 NSR  BP:  Sitting: 121/83      SpO2: 96 RA  MODE:  Ambulation: 370 ft    POST:  Rate/Rhythm: 104 ST  BP:  Sitting: 132/86      SpO2: 98 RA  Pt amb with supervision assistance, pt denies chest pain and dyspnea   Pt was educated on stent card, stent location, Antiplatelet and ASA use, wt restrictions, no baths/daily wash-ups, s/s of infection, ex guidelines, s/s to stop exercising, NTG use and calling 911, heart healthy diet, smoking cessation, risk factors and CRPII. Pt received MI book materials on exercise, diet, and CRPII. Will refer to [facility].   Pt might not be able to attend d/t work schedule.   Faustino Congress  MS, ACSM-CEP 9:10 AM 08/24/2023    Service time is from 0830 to 0940.

## 2023-08-24 NOTE — Progress Notes (Signed)
Rounding Note    Patient Name: Alicia Rosario Date of Encounter: 08/24/2023  [facility] Cardiologist: Dr. Herbie Baltimore  Subjective   No recurrent anginal pain  Inpatient Medications    Scheduled Meds:  aspirin EC  81 mg Oral Daily   atorvastatin  80 mg Oral Daily   Chlorhexidine Gluconate Cloth  6 each Topical Daily   losartan  25 mg Oral Daily   pantoprazole  40 mg Oral Daily   sodium chloride flush  3 mL Intravenous Q12H   ticagrelor  90 mg Oral BID   venlafaxine XR  225 mg Oral Q breakfast   Continuous Infusions:  PRN Meds: acetaminophen, HYDROmorphone (DILAUDID) injection, ondansetron (ZOFRAN) IV, mouth rinse, sodium chloride flush   Vital Signs    Vitals:   08/24/23 0400 08/24/23 0500 08/24/23 0600 08/24/23 0700  BP: 129/71 118/82 132/78   Pulse: 78 65 68 83  Resp: 15 19 18 17   Temp:      TempSrc:      SpO2: 99% 99% 99% 99%  Weight:      Height:        Intake/Output Summary (Last 24 hours) at 08/24/2023 0823 Last data filed at 08/24/2023 0800 Gross per 24 hour  Intake 1247.3 ml  Output --  Net 1247.3 ml      08/23/2023    5:00 PM 03/10/2022    1:06 PM 02/19/2021   10:31 AM  Last 3 Weights  Weight (lbs) 217 lb  217 lb 9.6 oz  Weight (kg) 98.431 kg  98.703 kg     Information is confidential and restricted. Go to Review Flowsheets to unlock data.      Telemetry    Sinus in 70s, rare PVCs - Personally Reviewed  ECG    08/04/23 ECG (independently read by me): NSR at 64, T wave inversion III, aVF  August 23, 2023 ECG (independently read by me): NSR 90, mild ST elevatin inferiorly, ST depression 1, aVL  Physical Exam   GEN: No acute distress.   Neck: No JVD Cardiac: RRR, no murmurs, rubs, or gallops.  Respiratory: Clear to auscultation bilaterally. GI: Soft, nontender, non-distended  MS: No edema; No deformity. Neuro:  Nonfocal  Psych: Normal affect   Labs    High Sensitivity Troponin:   Recent Labs  Lab 08/23/23 1629  08/23/23 1847 08/24/23 0240  TROPONINIHS 17 1,908* >24,000*     Chemistry Recent Labs  Lab 08/23/23 1629 08/23/23 1712 08/24/23 0240  NA 139 140 134*  K 3.4* 3.1* 3.6  CL 107 103 107  CO2 22  --  21*  GLUCOSE 107* 123* 89  BUN 9 8 5*  CREATININE 0.71 0.60 0.64  CALCIUM 8.8*  --  8.2*  PROT 7.3  --   --   ALBUMIN 3.7  --   --   AST 17  --   --   ALT 18  --   --   ALKPHOS 65  --   --   BILITOT 0.3  --   --   GFRNONAA >60  --  >60  ANIONGAP 10  --  6    Lipids  Recent Labs  Lab 08/24/23 0240  CHOL 131  TRIG 63  HDL 32*  LDLCALC 86  CHOLHDL 4.1    Hematology Recent Labs  Lab 08/23/23 1629 08/23/23 1712 08/24/23 0240  WBC 8.4  --  10.2  RBC 5.31*  --  4.92  HGB 13.2 12.9 12.2  HCT 39.3  38.0 36.3  MCV 74.0*  --  73.8*  MCH 24.9*  --  24.8*  MCHC 33.6  --  33.6  RDW 13.2  --  13.2  PLT 259  --  242   Thyroid No results for input(s): "TSH", "FREET4" in the last 168 hours.  BNPNo results for input(s): "BNP", "PROBNP" in the last 168 hours.  DDimer No results for input(s): "DDIMER" in the last 168 hours.   Radiology    CARDIAC CATHETERIZATION  Result Date: 08/23/2023   Dist Cx lesion is 99% stenosed.   Mid RCA lesion is 45% stenosed.   A drug-eluting stent was successfully placed using a STENT ONYX FRONTIER 2.5X18.   Post intervention, there is a 0% residual stenosis. POST-OPERATIVE DIAGNOSIS: Severe single-vessel CAD with culprit lesion being 90% subtotal occlusion of distal LCx into OM 2 crossing AV groove LCx with TIMI I flow and dye staining. Successful DES PCI revascularization of distal LCx into OM 3 using Onyx frontier DES 2.5 mm x 18 mm deployed to 16 ATM x 30 seconds.  Did not require postdilation as this was adequate postdilation to 2.6 mm.  TIMI-3 flow restored. Otherwise no significant CAD, 40% mid RCA and minimal luminal irregularities in the LAD with a large bifurcating diagonal branch. Preserved LVEF with mild inferolateral hypokinesis on LV gram.   LVEDP initially was 28 mmHg, post PCI reduced to 17 mmHg. RECOMMENDATIONS As long as the patient continues to meet low risk STEMI criteria and in the absence of any other complications or medical issues, we expect the patient to be ready for discharge on 08/24/2023. Recommend uninterrupted dual antiplatelet therapy with Aspirin 81mg  daily and Ticagrelor 90mg  twice daily for a minimum of 12 months (ACS-Class I recommendation). Gentle post cath hydration Check 2D Echo to assess EF Initiate high-dose statin (LDL 101) Target < 55 Borderline BP-holding home meds until tomorrow, hold amlodipine pending determination of using beta-blocker over a calcium channel blocker in setting of MI/CAD. Bryan Lemma, MD   DG Chest Portable 1 View  Result Date: 08/23/2023 CLINICAL DATA:  Myocardial infarction. EXAM: PORTABLE CHEST 1 VIEW COMPARISON:  None Available. FINDINGS: Normal mediastinum and cardiac silhouette. Normal pulmonary vasculature. No evidence of effusion, infiltrate, or pneumothorax. No acute bony abnormality. IMPRESSION: No acute cardiopulmonary process. Electronically Signed   By: Genevive Bi M.D.   On: 08/23/2023 17:08    Cardiac Studies   CATH/PCI: 08/23/23   Dist Cx lesion is 99% stenosed.   Mid RCA lesion is 45% stenosed.   A drug-eluting stent was successfully placed using a STENT ONYX FRONTIER 2.5X18.   Post intervention, there is a 0% residual stenosis.   POST-OPERATIVE DIAGNOSIS:  Severe single-vessel CAD with culprit lesion being 90% subtotal occlusion of distal LCx into OM 2 crossing AV groove LCx with TIMI I flow and dye staining. Successful DES PCI revascularization of distal LCx into OM 3 using Onyx frontier DES 2.5 mm x 18 mm deployed to 16 ATM x 30 seconds.  Did not require postdilation as this was adequate postdilation to 2.6 mm.  TIMI-3 flow restored. Otherwise no significant CAD, 40% mid RCA and minimal luminal irregularities in the LAD with a large bifurcating diagonal  branch. Preserved LVEF with mild inferolateral hypokinesis on LV gram.  LVEDP initially was 28 mmHg, post PCI reduced to 17 mmHg.     RECOMMENDATIONS As long as the patient continues to meet low risk STEMI criteria and in the absence of any other complications or medical issues,  we expect the patient to be ready for discharge on 08/24/2023. Recommend uninterrupted dual antiplatelet therapy with Aspirin 81mg  daily and Ticagrelor 90mg  twice daily for a minimum of 12 months (ACS-Class I recommendation).  Gentle post cath hydration  Check 2D Echo to assess EF Initiate high-dose statin (LDL 101) Target < 55 Borderline BP-holding home meds until tomorrow, hold amlodipine pending determination of using beta-blocker over a calcium channel blocker in setting of MI/CAD.    Intervention      Patient Profile     42 y.o. female   Assessment & Plan    Day 1 s/p STEMI: Patient developed new onset chest pain yesterday.  Emergent catheterization showed subtotal occlusion of the distal left circumflex coronary artery responsible for her inferior and lateral ST-T abnormalities.  She underwent successful stenting to the left circumflex vessel.  She has mild 45% mid RCA stenosis and minimal plaque in her LAD.  Troponin peak greater than 24,000.  She is currently pain-free.  Plan to start post MI beta-blocker with heart rate increasing to 100 with minimal walking and will start metoprolol titrate 12.5 mg twice a day today and transition to succinate.  I discussed the importance of DAPT for minimum of a year.  She is on losartan 25 mg daily.  Plan for 2D echo Doppler study today. Depression: Patient has been on Effexor. Hyperlipidemia: Target LDL less than 55.  Lipid panel shows LDL cholesterol at 86.  Will start high potency statin therapy, now on atorvastatin.  Will check LP(a) today she is associated with increased risk for vulnerable plaque if elevated. Microcytic indices: Will check studies, if normal may  be due to hemoglobinopathy.   Ambulate with cardiac rehab in ICU this morning.  Probable transfer to cardiac telemetry this afternoon.  For questions or updates, please contact [facility] Please consult [email or web address] for contact info under        Signed, Nicki Guadalaj[place], MD  08/24/2023, 8:23 AM

## 2023-08-24 NOTE — Progress Notes (Signed)
*  PRELIMINARY RESULTS* Echocardiogram 2D Echocardiogram has been performed.  Alicia Rosario 08/24/2023, 11:21 AM

## 2023-08-24 NOTE — TOC Initial Note (Signed)
Transition of Care [facility]) - Initial/Assessment Note    Patient Details  Name: Alicia Rosario MRN: [id number] Date of Birth: [birth year]  [facility] CM/SW Contact:    Elliot Cousin, RN Phone Number: [phone] 08/24/2023, 1:53 PM  Clinical Narrative:    CM spoke to pt at bedside. 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 States she will need note for work. She will contact her Human Resources to check on FMLA and STD benefits. Sent [employer] for benefit check.               Expected Discharge Plan: Home/Self Care Barriers to Discharge: Continued Medical Work up   Patient Goals and CMS Choice Patient states their goals for this hospitalization and ongoing recovery are:: wants to get better and stay independent          Expected Discharge Plan and Services   Discharge Planning Services: CM Consult       Prior Living Arrangements/Services   Lives with:: Spouse Patient language and need for interpreter reviewed:: Yes Do you feel safe going back to the place where you live?: Yes      Need for Family Participation in Patient Care: No (Comment) Care giver support system in place?: Yes (comment)   Criminal Activity/Legal Involvement Pertinent to Current Situation/Hospitalization: No - Comment as needed  Activities of Daily Living   ADL Screening (condition at time of admission) Independently performs ADLs?: Yes (appropriate for developmental age) Is the patient deaf or have difficulty hearing?: No Does the patient have difficulty seeing, even when wearing glasses/contacts?: No Does the patient have difficulty concentrating, remembering, or making decisions?: No  Permission Sought/Granted Permission sought to share information with : Case Manager, Family Supports, PCP Permission granted to share information with : Yes, Verbal Permission Granted  Share Information with NAME: Osvaldo Shipper     Permission granted to share info w  Relationship: husband  Permission granted to share info w Contact Information: [phone]  Emotional Assessment Appearance:: Appears stated age Attitude/Demeanor/Rapport: Engaged Affect (typically observed): Accepting Orientation: : Oriented to Self, Oriented to Place, Oriented to  Time, Oriented to Situation Alcohol / Substance Use: Tobacco Use Psych Involvement: No (comment)  Admission diagnosis:  Acute ST elevation myocardial infarction (STEMI) of inferior wall (HCC) [I21.19] Patient Active Problem List   Diagnosis Date Noted   Acute ST elevation myocardial infarction (STEMI) of inferior wall (HCC) 08/23/2023   Essential (primary) hypertension 08/23/2023   Coronary artery disease involving native coronary artery of native heart with unstable angina pectoris (HCC) 08/23/2023   Presence of drug coated stent in left circumflex coronary artery 08/23/2023   Hyperlipidemia with target low density lipoprotein (LDL) cholesterol less than 55 mg/dL 59/56/3875   History of bilateral tubal ligation 03/31/2017   Hemoglobin C (Hb-C) (HCC) 01/14/2017   Supervision of other normal pregnancy, antepartum 10/06/2016   Self-mutilation 10/02/2014   Bipolar II disorder (HCC) 08/02/2014   GAD (generalized anxiety disorder) 08/02/2014   Mood disorder (HCC) 06/26/2014   IBS (irritable bowel syndrome) 04/19/2014   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   MDD (major depressive disorder) 04/19/2014   Carpal tunnel syndrome 02/22/2014   PCP:  Shon Baton, PA-C Pharmacy:   [id number] [place], [place] - [address] AT [facility] OF [address] [place] [address] Phone: [phone] Fax: [phone]  [facility] - [place], [place] - [address] [place] [address] Phone: [phone] Fax: [phone]  [facility] [address], [address] [place] [address] Phone: [phone] Fax: [phone]  [facility] [address] [place] [address] Phone: [phone] Fax: [phone]     Social Determinants of Health (SDOH) Social History: SDOH Screenings   Food Insecurity: No Food Insecurity (08/23/2023)  Housing: Low Risk  (08/23/2023)  Transportation Needs: No Transportation Needs (08/23/2023)  Utilities: Not At Risk (08/23/2023)  Depression (PHQ2-9): Medium Risk (02/19/2021)  Tobacco Use: Medium Risk (08/23/2023)   SDOH Interventions:     Readmission Risk Interventions     No data to display

## 2023-08-25 ENCOUNTER — Telehealth: Payer: Self-pay | Admitting: Cardiology

## 2023-08-25 ENCOUNTER — Other Ambulatory Visit (HOSPITAL_COMMUNITY): Payer: Self-pay

## 2023-08-25 DIAGNOSIS — I2119 ST elevation (STEMI) myocardial infarction involving other coronary artery of inferior wall: Secondary | ICD-10-CM | POA: Diagnosis not present

## 2023-08-25 LAB — CBC
HCT: 39 % (ref 36.0–46.0)
Hemoglobin: 13 g/dL (ref 12.0–15.0)
MCH: 24.7 pg — ABNORMAL LOW (ref 26.0–34.0)
MCHC: 33.3 g/dL (ref 30.0–36.0)
MCV: 74 fL — ABNORMAL LOW (ref 80.0–100.0)
Platelets: 236 10*3/uL (ref 150–400)
RBC: 5.27 MIL/uL — ABNORMAL HIGH (ref 3.87–5.11)
RDW: 13.1 % (ref 11.5–15.5)
WBC: 7.7 10*3/uL (ref 4.0–10.5)
nRBC: 0 % (ref 0.0–0.2)

## 2023-08-25 LAB — BASIC METABOLIC PANEL
Anion gap: 12 (ref 5–15)
BUN: 8 mg/dL (ref 6–20)
CO2: 21 mmol/L — ABNORMAL LOW (ref 22–32)
Calcium: 8.9 mg/dL (ref 8.9–10.3)
Chloride: 105 mmol/L (ref 98–111)
Creatinine, Ser: 0.65 mg/dL (ref 0.44–1.00)
GFR, Estimated: >60 mL/min (ref >60)
Glucose, Bld: 95 mg/dL (ref 70–99)
Potassium: 3.9 mmol/L (ref 3.5–5.1)
Sodium: 138 mmol/L (ref 135–145)

## 2023-08-25 LAB — LIPOPROTEIN A (LPA): Lipoprotein (a): 90.4 nmol/L — ABNORMAL HIGH (ref <75.0)

## 2023-08-25 MED ORDER — METOPROLOL SUCCINATE ER 25 MG PO TB24
25.0000 mg | ORAL_TABLET | Freq: Every day | ORAL | Status: DC
  Administered 2023-08-25: 25 mg via ORAL
  Filled 2023-08-25: qty 1

## 2023-08-25 MED ORDER — TICAGRELOR 90 MG PO TABS
90.0000 mg | ORAL_TABLET | Freq: Two times a day (BID) | ORAL | 2 refills | Status: DC
  Filled 2023-08-25: qty 180, 90d supply, fill #0

## 2023-08-25 MED ORDER — NITROGLYCERIN 0.4 MG SL SUBL
0.4000 mg | SUBLINGUAL_TABLET | SUBLINGUAL | 2 refills | Status: AC | PRN
  Filled 2023-08-25: qty 25, 8d supply, fill #0

## 2023-08-25 MED ORDER — METOPROLOL SUCCINATE ER 25 MG PO TB24
25.0000 mg | ORAL_TABLET | Freq: Every day | ORAL | 1 refills | Status: DC
  Filled 2023-08-25: qty 90, 90d supply, fill #0

## 2023-08-25 MED ORDER — ASPIRIN 81 MG PO TBEC
81.0000 mg | DELAYED_RELEASE_TABLET | Freq: Every day | ORAL | 2 refills | Status: AC
  Filled 2023-08-25: qty 90, 90d supply, fill #0

## 2023-08-25 MED ORDER — ATORVASTATIN CALCIUM 80 MG PO TABS
80.0000 mg | ORAL_TABLET | Freq: Every day | ORAL | 1 refills | Status: DC
  Filled 2023-08-25: qty 90, 90d supply, fill #0

## 2023-08-25 NOTE — Progress Notes (Addendum)
   Patient Name: Alicia Rosario Date of Encounter: 08/25/2023 [facility] Cardiologist: None   Interval Summary  .    Feels well this morning. No chest pain.   Vital Signs .    Vitals:   08/24/23 2100 08/24/23 2115 08/24/23 2144 08/25/23 0532  BP: 115/80 115/80 (!) 119/91 123/89  Pulse:  70 73 68  Resp:   18 18  Temp: 98.4 F (36.9 C)  98.2 F (36.8 C) 98.2 F (36.8 C)  TempSrc: Oral  Oral Oral  SpO2:   100% 99%  Weight:   99.9 kg   Height:   5\' 4"  (1.626 m)     Intake/Output Summary (Last 24 hours) at 08/25/2023 0755 Last data filed at 08/24/2023 1600 Gross per 24 hour  Intake 1500 ml  Output --  Net 1500 ml      08/24/2023    9:44 PM 08/23/2023    5:00 PM 03/10/2022    1:06 PM  Last 3 Weights  Weight (lbs) 220 lb 3.2 oz 217 lb   Weight (kg) 99.882 kg 98.431 kg      Information is confidential and restricted. Go to Review Flowsheets to unlock data.      Telemetry/ECG    Sinus Rhythm - Personally Reviewed  Physical Exam .   GEN: No acute distress.   Neck: No JVD Cardiac: RRR, no murmurs, rubs, or gallops.  Respiratory: Clear to auscultation bilaterally. GI: Soft, nontender, non-distended  MS: No edema.  Skin: Right radial cath site stable.   Assessment & Plan .     42 y.o. female with PMH notable for hypertension and GERD who is being seen 08/23/2023 for the evaluation of severe chest pain with ST elevations on EKG by EMS-code STEMI.   STEMI -- s/p cardiac cath with PCI/DES x1 to dLcx. hsTn peaked at >24000. Recommendations for DAPT with ASA/Brilinta for at least one year. Echo showed LVEF of 50-55%, no rWMA, normal RV. Working with CR, no recurrent chest pain. -- continue ASA, Brilinta, consolidate metoprolol 12.5mg  BID to Toprol 25mg  daily, losartan 25mg  daily, atorvastatin 80mg  daily   HLD -- LDL 101, HDL 35 -- started on atorvastatin 80mg  daily  -- will need FLP/LFTs in 8 weeks   HTN -- well controlled -- continue Toprol XL and  losartan  For questions or updates, please contact [facility] Please consult [email or web address] for contact info under   Signed, Laverda Page, NP    Patient seen and examined. Agree with assessment and plan.  No recurrent chest pain.  She is day 2 status post inferolateral MI secondary to total occlusion of the distal circumflex.  She has mild 45% mid RCA stenosis with minimal plaque in her LAD.  She was started on metoprolol tartrate yesterday, will transition to metoprolol succinate today.  She will need DAPT for minimum of 1 year.  Echo Doppler study has shown fairly normal preserved LV function with EF 50 to 55% without definitive wall motion abnormality.  Now on high potency statin therapy.  LP(a) is pending.  Heart rate today is improved with sinus rhythm in the 70s.  Patient is stable for discharge.  Will plan discharge today with office follow-up with Dr. Herbie Baltimore.   [name], MD, [facility] 08/25/2023 11:07 AM

## 2023-08-25 NOTE — Discharge Summary (Addendum)
Discharge Summary    Patient ID: Alicia Rosario MRN: [id number]; DOB: 03-03-1981  Admit date: 08/23/2023 Discharge date: 08/25/2023  PCP:  Drenda Freeze   [facility] Providers Cardiologist:  Bryan Lemma, MD     Discharge Diagnoses    Principal Problem:   Acute ST elevation myocardial infarction (STEMI) of inferior wall (HCC) Active Problems:   MDD (major depressive disorder)   GAD (generalized anxiety disorder)   Essential (primary) hypertension   Coronary artery disease involving native coronary artery of native heart with unstable angina pectoris (HCC)   Presence of drug coated stent in left circumflex coronary artery   Hyperlipidemia with target low density lipoprotein (LDL) cholesterol less than 55 mg/dL   Diagnostic Studies/Procedures    Cath: 08/23/2023    Dist Cx lesion is 99% stenosed.   Mid RCA lesion is 45% stenosed.   A drug-eluting stent was successfully placed using a STENT ONYX FRONTIER 2.5X18.   Post intervention, there is a 0% residual stenosis.   POST-OPERATIVE DIAGNOSIS:  Severe single-vessel CAD with culprit lesion being 90% subtotal occlusion of distal LCx into OM 2 crossing AV groove LCx with TIMI I flow and dye staining. Successful DES PCI revascularization of distal LCx into OM 3 using Onyx frontier DES 2.5 mm x 18 mm deployed to 16 ATM x 30 seconds.  Did not require postdilation as this was adequate postdilation to 2.6 mm.  TIMI-3 flow restored. Otherwise no significant CAD, 40% mid RCA and minimal luminal irregularities in the LAD with a large bifurcating diagonal branch. Preserved LVEF with mild inferolateral hypokinesis on LV gram.  LVEDP initially was 28 mmHg, post PCI reduced to 17 mmHg.     RECOMMENDATIONS As long as the patient continues to meet low risk STEMI criteria and in the absence of any other complications or medical issues, we expect the patient to be ready for discharge on 08/24/2023. Recommend  uninterrupted dual antiplatelet therapy with Aspirin 81mg  daily and Ticagrelor 90mg  twice daily for a minimum of 12 months (ACS-Class I recommendation).  Gentle post cath hydration  Check 2D Echo to assess EF Initiate high-dose statin (LDL 101) Target < 55 Borderline BP-holding home meds until tomorrow, hold amlodipine pending determination of using beta-blocker over a calcium channel blocker in setting of MI/CAD.     Bryan Lemma, MD  Echo: 08/24/2023  IMPRESSIONS     1. Left ventricular ejection fraction, by estimation, is 50 to 55%. The  left ventricle has low normal function. The left ventricle has no regional  wall motion abnormalities. Left ventricular diastolic parameters were  normal.   2. Right ventricular systolic function is normal. The right ventricular  size is normal. There is normal pulmonary artery systolic pressure.   3. The mitral valve is normal in structure. No evidence of mitral valve  regurgitation. No evidence of mitral stenosis.   4. The aortic valve is normal in structure. Aortic valve regurgitation is  not visualized. No aortic stenosis is present.   5. The inferior vena cava is normal in size with greater than 50%  respiratory variability, suggesting right atrial pressure of 3 mmHg.   FINDINGS   Left Ventricle: Left ventricular ejection fraction, by estimation, is 50  to 55%. The left ventricle has low normal function. The left ventricle has  no regional wall motion abnormalities. The left ventricular internal  cavity size was normal in size.  There is no left ventricular hypertrophy. Left ventricular diastolic  parameters were normal.  Right Ventricle: The right ventricular size is normal. No increase in  right ventricular wall thickness. Right ventricular systolic function is  normal. There is normal pulmonary artery systolic pressure. The tricuspid  regurgitant velocity is 1.78 m/s, and   with an assumed right atrial pressure of 3 mmHg, the  estimated right  ventricular systolic pressure is 15.7 mmHg.   Left Atrium: Left atrial size was normal in size.   Right Atrium: Right atrial size was normal in size.   Pericardium: There is no evidence of pericardial effusion.   Mitral Valve: The mitral valve is normal in structure. No evidence of  mitral valve regurgitation. No evidence of mitral valve stenosis.   Tricuspid Valve: The tricuspid valve is normal in structure. Tricuspid  valve regurgitation is not demonstrated. No evidence of tricuspid  stenosis.   Aortic Valve: The aortic valve is normal in structure. Aortic valve  regurgitation is not visualized. No aortic stenosis is present. Aortic  valve mean gradient measures 3.0 mmHg. Aortic valve peak gradient measures  5.1 mmHg. Aortic valve area, by VTI  measures 2.35 cm.   Pulmonic Valve: The pulmonic valve was normal in structure. Pulmonic valve  regurgitation is not visualized. No evidence of pulmonic stenosis.   Aorta: The aortic root is normal in size and structure.   Venous: The inferior vena cava is normal in size with greater than 50%  respiratory variability, suggesting right atrial pressure of 3 mmHg.   IAS/Shunts: No atrial level shunt detected by color flow Doppler.   _____________   History of Present Illness     Alicia Rosario is a 42 y.o. female with  PMH notable for hypertension and GERD who was seen 08/23/2023 for the evaluation of severe chest pain with ST elevations on EKG by EMS-code STEMI.   Alicia Rosario was in her usual state of health until roughly 2:00 in the afternoon of 08/23/2023 when she started having left-sided chest discomfort radiating to left arm that was initially modest roughly 6-7/10.  The pain did not go away and was radiating down the left arm.  She and her husband discussed that they decided to call EMS.  Upon initial evaluation by EMS, her EKG was nondiagnostic, however when pain worsened to 9/10 there was evidence of ST  elevations in II,III and aVF with depressions in I, and aVF as well as septal leads V1 and V2.  She was given 4 baby aspirin by her husband who works at a pharmacy and has not [employer].  EMS gave her 2 of sublingual nitroglycerin with no resolution of pain.  In the ER she was still having pain, but her EKG was less diagnostic with only subtle ST elevations in II, 3 and aVF.  She was given 4 mg morphine with no relief, but did have some relief with 1 of Dilaudid.  Given her presentation concerning for ACS with at least dynamic ST changes, she was seen by Dr. Herbie Baltimore chose to take her to the Cath Lab as a possible inferior STEMI.   Hospital Course      STEMI -- s/p cardiac cath with PCI/DES x1 to dLcx. hsTn peaked at >24000. Recommendations for DAPT with ASA/Brilinta for at least one year. Echo showed LVEF of 50-55%, no rWMA, normal RV. Worked with CR, no recurrent chest pain. -- continue ASA, Brilinta, Toprol 25mg  daily, losartan 25mg  daily, atorvastatin 80mg  daily    HLD -- LDL 101, HDL 35, LP (a) pending at discharge  -- started  on atorvastatin 80mg  daily  -- will need FLP/LFTs in 8 weeks    HTN -- well controlled -- continue Toprol XL and losartan  Depression -- continue on Effexor  Patient was seen by Dr. Tresa Endo and deemed stable for discharge home. Follow up arranged in the office. Medications sent to [facility] pharmacy. Educated by pharmD prior to discharge.   Did the patient have an acute coronary syndrome (MI, NSTEMI, STEMI, etc) this admission?:  Yes                               AHA/ACC ACS Clinical Performance & Quality Measures: Aspirin prescribed? - Yes ADP Receptor Inhibitor (Plavix/Clopidogrel, Brilinta/Ticagrelor or Effient/Prasugrel) prescribed (includes medically managed patients)? - Yes Beta Blocker prescribed? - Yes High Intensity Statin (Lipitor 40-80mg  or Crestor 20-40mg ) prescribed? - Yes EF assessed during THIS hospitalization? - Yes For EF <40%, was ACEI/ARB  prescribed? - Not Applicable (EF >/= 40%) For EF <40%, Aldosterone Antagonist (Spironolactone or Eplerenone) prescribed? - Not Applicable (EF >/= 40%) Cardiac Rehab Phase II ordered (including medically managed patients)? - Yes   The patient will be scheduled for a TOC follow up appointment in 10-14 days.  A message has been sent to the [facility] and Scheduling Pool at the office where the patient should be seen for follow up.  _____________  Discharge Vitals Blood pressure (!) 130/91, pulse 94, temperature 98.3 F (36.8 C), temperature source Oral, resp. rate 16, height 5\' 4"  (1.626 m), weight 99.9 kg, SpO2 100%.  Filed Weights   08/23/23 1700 08/24/23 2144  Weight: 98.4 kg 99.9 kg    Labs & Radiologic Studies    CBC Recent Labs    08/23/23 1629 08/23/23 1712 08/24/23 0240 08/25/23 0459  WBC 8.4  --  10.2 7.7  NEUTROABS 4.0  --   --   --   HGB 13.2   < > 12.2 13.0  HCT 39.3   < > 36.3 39.0  MCV 74.0*  --  73.8* 74.0*  PLT 259  --  242 236   < > = values in this interval not displayed.   Basic Metabolic Panel Recent Labs    16/10/96 0240 08/25/23 0459  NA 134* 138  K 3.6 3.9  CL 107 105  CO2 21* 21*  GLUCOSE 89 95  BUN 5* 8  CREATININE 0.64 0.65  CALCIUM 8.2* 8.9   Liver Function Tests Recent Labs    08/23/23 1629  AST 17  ALT 18  ALKPHOS 65  BILITOT 0.3  PROT 7.3  ALBUMIN 3.7   Recent Labs    08/23/23 1847  LIPASE 31   High Sensitivity Troponin:   Recent Labs  Lab 08/23/23 1629 08/23/23 1847 08/24/23 0240  TROPONINIHS 17 1,908* >24,000*    BNP Invalid input(s): "POCBNP" D-Dimer No results for input(s): "DDIMER" in the last 72 hours. Hemoglobin A1C No results for input(s): "HGBA1C" in the last 72 hours. Fasting Lipid Panel Recent Labs    08/24/23 0240  CHOL 131  HDL 32*  LDLCALC 86  TRIG 63  CHOLHDL 4.1   Thyroid Function Tests No results for input(s): "TSH", "T4TOTAL", "T3FREE", "THYROIDAB" in the last 72 hours.  Invalid  input(s): "FREET3" _____________  ECHOCARDIOGRAM COMPLETE  Result Date: 08/24/2023    ECHOCARDIOGRAM REPORT   Patient Name:   Alicia Rosario Date of Exam: 08/24/2023 Medical Rec #:  [id number]        Height:  64.0 in Accession #:    [id number]       Weight:       217.0 lb Date of Birth:  [birth year]        BSA:          2.025 m Patient Age:    42 years         BP:           114/78 mmHg Patient Gender: F                HR:           60 bpm. Exam Location:  Inpatient Procedure: 2D Echo, Cardiac Doppler and Color Doppler Indications:    Acute myocardial infarction  History:        Patient has no prior history of Echocardiogram examinations.                 Arrythmias:PAC.  Sonographer:    Karma Ganja Referring Phys: [id number] DAVID W HARDING IMPRESSIONS  1. Left ventricular ejection fraction, by estimation, is 50 to 55%. The left ventricle has low normal function. The left ventricle has no regional wall motion abnormalities. Left ventricular diastolic parameters were normal.  2. Right ventricular systolic function is normal. The right ventricular size is normal. There is normal pulmonary artery systolic pressure.  3. The mitral valve is normal in structure. No evidence of mitral valve regurgitation. No evidence of mitral stenosis.  4. The aortic valve is normal in structure. Aortic valve regurgitation is not visualized. No aortic stenosis is present.  5. The inferior vena cava is normal in size with greater than 50% respiratory variability, suggesting right atrial pressure of 3 mmHg. FINDINGS  Left Ventricle: Left ventricular ejection fraction, by estimation, is 50 to 55%. The left ventricle has low normal function. The left ventricle has no regional wall motion abnormalities. The left ventricular internal cavity size was normal in size. There is no left ventricular hypertrophy. Left ventricular diastolic parameters were normal. Right Ventricle: The right ventricular size is normal. No increase in right  ventricular wall thickness. Right ventricular systolic function is normal. There is normal pulmonary artery systolic pressure. The tricuspid regurgitant velocity is 1.78 m/s, and  with an assumed right atrial pressure of 3 mmHg, the estimated right ventricular systolic pressure is 15.7 mmHg. Left Atrium: Left atrial size was normal in size. Right Atrium: Right atrial size was normal in size. Pericardium: There is no evidence of pericardial effusion. Mitral Valve: The mitral valve is normal in structure. No evidence of mitral valve regurgitation. No evidence of mitral valve stenosis. Tricuspid Valve: The tricuspid valve is normal in structure. Tricuspid valve regurgitation is not demonstrated. No evidence of tricuspid stenosis. Aortic Valve: The aortic valve is normal in structure. Aortic valve regurgitation is not visualized. No aortic stenosis is present. Aortic valve mean gradient measures 3.0 mmHg. Aortic valve peak gradient measures 5.1 mmHg. Aortic valve area, by VTI measures 2.35 cm. Pulmonic Valve: The pulmonic valve was normal in structure. Pulmonic valve regurgitation is not visualized. No evidence of pulmonic stenosis. Aorta: The aortic root is normal in size and structure. Venous: The inferior vena cava is normal in size with greater than 50% respiratory variability, suggesting right atrial pressure of 3 mmHg. IAS/Shunts: No atrial level shunt detected by color flow Doppler.  LEFT VENTRICLE PLAX 2D LVIDd:         5.10 cm   Diastology LVIDs:         3.90 cm  LV e' medial:    9.46 cm/s LV PW:         0.90 cm   LV E/e' medial:  9.9 LV IVS:        0.70 cm   LV e' lateral:   10.10 cm/s LVOT diam:     2.10 cm   LV E/e' lateral: 9.2 LV SV:         47 LV SV Index:   23 LVOT Area:     3.46 cm  RIGHT VENTRICLE             IVC RV Basal diam:  3.50 cm     IVC diam: 1.40 cm RV S prime:     12.30 cm/s TAPSE (M-mode): 2.0 cm LEFT ATRIUM             Index        RIGHT ATRIUM           Index LA diam:        3.70 cm  1.83 cm/m   RA Area:     19.40 cm LA Vol (A2C):   42.6 ml 21.03 ml/m  RA Volume:   61.10 ml  30.17 ml/m LA Vol (A4C):   20.8 ml 10.27 ml/m LA Biplane Vol: 29.5 ml 14.57 ml/m  AORTIC VALVE AV Area (Vmax):    2.12 cm AV Area (Vmean):   1.88 cm AV Area (VTI):     2.35 cm AV Vmax:           113.00 cm/s AV Vmean:          80.100 cm/s AV VTI:            0.202 m AV Peak Grad:      5.1 mmHg AV Mean Grad:      3.0 mmHg LVOT Vmax:         69.20 cm/s LVOT Vmean:        43.500 cm/s LVOT VTI:          0.137 m LVOT/AV VTI ratio: 0.68  AORTA Ao Root diam: 3.10 cm Ao Asc diam:  3.10 cm MITRAL VALVE               TRICUSPID VALVE MV Area (PHT): 4.68 cm    TR Peak grad:   12.7 mmHg MV Decel Time: 162 msec    TR Vmax:        178.00 cm/s MV E velocity: 93.40 cm/s MV A velocity: 68.30 cm/s  SHUNTS MV E/A ratio:  1.37        Systemic VTI:  0.14 m                            Systemic Diam: 2.10 cm Aditya Sabharwal Electronically signed by Dorthula Nettles Signature Date/Time: 08/24/2023/2:39:17 PM    Final    CARDIAC CATHETERIZATION  Result Date: 08/23/2023   Dist Cx lesion is 99% stenosed.   Mid RCA lesion is 45% stenosed.   A drug-eluting stent was successfully placed using a STENT ONYX FRONTIER 2.5X18.   Post intervention, there is a 0% residual stenosis. POST-OPERATIVE DIAGNOSIS: Severe single-vessel CAD with culprit lesion being 90% subtotal occlusion of distal LCx into OM 2 crossing AV groove LCx with TIMI I flow and dye staining. Successful DES PCI revascularization of distal LCx into OM 3 using Onyx frontier DES 2.5 mm x 18 mm deployed to 16 ATM x 30 seconds.  Did not require  postdilation as this was adequate postdilation to 2.6 mm.  TIMI-3 flow restored. Otherwise no significant CAD, 40% mid RCA and minimal luminal irregularities in the LAD with a large bifurcating diagonal branch. Preserved LVEF with mild inferolateral hypokinesis on LV gram.  LVEDP initially was 28 mmHg, post PCI reduced to 17 mmHg. RECOMMENDATIONS  As long as the patient continues to meet low risk STEMI criteria and in the absence of any other complications or medical issues, we expect the patient to be ready for discharge on 08/24/2023. Recommend uninterrupted dual antiplatelet therapy with Aspirin 81mg  daily and Ticagrelor 90mg  twice daily for a minimum of 12 months (ACS-Class I recommendation). Gentle post cath hydration Check 2D Echo to assess EF Initiate high-dose statin (LDL 101) Target < 55 Borderline BP-holding home meds until tomorrow, hold amlodipine pending determination of using beta-blocker over a calcium channel blocker in setting of MI/CAD. Bryan Lemma, MD   DG Chest Portable 1 View  Result Date: 08/23/2023 CLINICAL DATA:  Myocardial infarction. EXAM: PORTABLE CHEST 1 VIEW COMPARISON:  None Available. FINDINGS: Normal mediastinum and cardiac silhouette. Normal pulmonary vasculature. No evidence of effusion, infiltrate, or pneumothorax. No acute bony abnormality. IMPRESSION: No acute cardiopulmonary process. Electronically Signed   By: Genevive Bi M.D.   On: 08/23/2023 17:08   Disposition   Pt is being discharged home today in good condition.  Follow-up Plans & Appointments     Follow-up Information     Leilani Able, MD Follow up.   Specialty: Family Medicine Why: hospital follow up appt scheduled Contact information: [address] [place] [address] [phone]                Discharge Instructions     Amb Referral to Cardiac Rehabilitation   Complete by: As directed    Diagnosis:  Coronary Stents STEMI     After initial evaluation and assessments completed: Virtual Based Care may be provided alone or in conjunction with Phase 2 Cardiac Rehab based on patient barriers.: Yes   Intensive Cardiac Rehabilitation (ICR) [facility] location only OR Traditional Cardiac Rehabilitation (TCR) *If criteria for ICR are not met will enroll in TCR [facility] only): Yes   Call MD for:  difficulty breathing, headache or  visual disturbances   Complete by: As directed    Call MD for:  persistant dizziness or light-headedness   Complete by: As directed    Call MD for:  redness, tenderness, or signs of infection (pain, swelling, redness, odor or green/yellow discharge around incision site)   Complete by: As directed    Diet - low sodium heart healthy   Complete by: As directed    Increase activity slowly   Complete by: As directed         Discharge Medications   Allergies as of 08/25/2023   No Known Allergies      Medication List     STOP taking these medications    amLODipine 10 MG tablet Commonly known as: NORVASC   ibuprofen 200 MG tablet Commonly known as: ADVIL       TAKE these medications    aspirin EC 81 MG tablet Take 1 tablet (81 mg total) by mouth daily. Swallow whole. Start taking on: August 26, 2023   atorvastatin 80 MG tablet Commonly known as: LIPITOR Take 1 tablet (80 mg total) by mouth daily. Start taking on: August 26, 2023   losartan 25 MG tablet Commonly known as: COZAAR Take 25 mg by mouth daily.   metoprolol  succinate 25 MG 24 hr tablet Commonly known as: TOPROL-XL Take 1 tablet (25 mg total) by mouth daily. Start taking on: August 26, 2023   nitroGLYCERIN 0.4 MG SL tablet Commonly known as: Nitrostat Place 1 tablet (0.4 mg total) under the tongue every 5 (five) minutes as needed.   pantoprazole 40 MG tablet Commonly known as: PROTONIX Take 40 mg by mouth daily as needed (reflux).   ticagrelor 90 MG Tabs tablet Commonly known as: BRILINTA Take 1 tablet (90 mg total) by mouth 2 (two) times daily.   traZODone 50 MG tablet Commonly known as: DESYREL Take 1 tablet (50 mg total) by mouth at bedtime as needed for sleep.   venlafaxine 75 MG tablet Commonly known as: EFFEXOR Take 225 mg by mouth 2 (two) times daily.           Outstanding Labs/Studies   FLP/LFTs in 8 weeks   Duration of Discharge Encounter   Greater than 30 minutes  including physician time.  Signed, Laverda Page, NP 08/25/2023, 11:40 AM

## 2023-08-25 NOTE — Telephone Encounter (Signed)
   Transition of Care Follow-up Phone Call Request    Patient Name: Alicia Rosario Date of Birth: 11-25-1980 Date of Encounter: 08/25/2023  Primary Care Provider:  Shon Baton, PA-C Primary Cardiologist:  None  Alicia Rosario has been scheduled for a transition of care follow up appointment with a [facility] provider:  Robet Leu 11/6  Please reach out to Alicia Rosario within 48 hours of discharge to confirm appointment and review transition of care protocol questionnaire. Anticipated discharge date: 10/29  Laverda Page, NP  08/25/2023, 11:23 AM

## 2023-08-25 NOTE — Progress Notes (Signed)
CARDIAC REHAB PHASE I   PRE:  Rate/Rhythm: 93 NSR  BP:  Sitting: 130/91      SpO2: 100 RA  MODE:  Ambulation: 470 ft    POST:  Rate/Rhythm: 104 ST  BP:  Sitting: 131/99      SpO2: 100 RA  Pt amb with supervision assistance, pt walked well w/o chest pain and dyspnea. Pt denies questions, referral placed yesterday.    Faustino Congress  MS, ACSM-CEP 9:01 AM 08/25/2023    Service time is from 0845 to 0903.

## 2023-08-26 NOTE — Telephone Encounter (Signed)
Patient contacted regarding discharge from [facility] on 08/25/23.  Patient understands to follow up with provider Robet Leu on 09/02/2023 at 10:55 am at [facility]. Patient understands discharge instructions? Yes Patient understands medications and regiment? Yes Patient understands to bring all medications to this visit? Yes Ask patient:  Are you enrolled in My Chart? Yes

## 2023-08-27 ENCOUNTER — Telehealth (HOSPITAL_COMMUNITY): Payer: Self-pay

## 2023-08-27 NOTE — Telephone Encounter (Signed)
Pt insurance is active and benefits verified through [facility] Medicaid Co-pay 0, DED 0/0 met, out of pocket 0/0 met, co-insurance 0%. no pre-authorization required, Debbie/[employer] 08/27/23@1210 , REF# [id number]   How many CR sessions are covered? (for ICR)36 visits Is this a lifetime maximum or an annual maximum? Per 12 months Has the member used any of these services to date? no Is there a time limit (weeks/months) on start of program and/or program completion? no

## 2023-08-27 NOTE — Telephone Encounter (Signed)
Called patient to see if she is interested in the Cardiac Rehab Program. Patient expressed interest. Explained scheduling process and went over insurance, patient verbalized understanding. Will contact patient for scheduling once f/u has been completed. 

## 2023-08-27 NOTE — Telephone Encounter (Signed)
Pt insurance is active through [place]. Ref# [id number]   Will fax over Medicaid Reimbursement form to Dr. Herbie Baltimore   Will contact pt to see if she is interested in the Cardiac Rehab program. If interested, pt will need to complete f/u appt on. Once completed, pt will be contacted for scheduling upon review by the RN Navigator.

## 2023-08-28 NOTE — Progress Notes (Unsigned)
Cardiology Office Note:  .   Date:  09/02/2023  ID:  Alicia Rosario, DOB 05/31/1981, MRN [id number] PCP: [facility] Providers Cardiologist:  Bryan Lemma, MD  History of Present Illness: Alicia Rosario is a 42 y.o. female with a past medical history of recent STEMI, major depressive disorder, generalized anxiety disorder, hypertension, hyperlipidemia.  Patient followed by Dr. Herbie Baltimore and presents today for follow-up after STEMI.  Patient presented on 10/27 with acute left-sided chest discomfort that radiated to the left arm. While in the ER, her pain intensified and EKG showed evidence of ST elevations in leads II, III, and aVF with depressions in leads I, aVL, V1, V2.  She was taken to the Cath Lab for concern of possible inferior STEMI.  Catheterization on 10/27 showed severe single-vessel CAD with culprit lesion being 90% subtotal occlusion of distal left circumflex. Underwent successful DES revascularization of distal left circumflex.  There was otherwise 40% mid RCA disease and minimal luminal irregularities in the LAD.  Recommended uninterrupted dual antiplatelet therapy with aspirin and Brilinta for minimum of 12 months.  She underwent echocardiogram on 08/24/2023 that showed EF 50-55%, no regional wall motion abnormalities, normal RV systolic function, normal PA systolic pressure, no significant valvular disease.  She was discharged on aspirin, Brilinta, Toprol 25 mg daily, losartan 25 mg daily, atorvastatin 80 mg daily.  Today, patient reports that she has been doing well from a cardiac standpoint since her procedure.  Reports some soreness in her left shoulder, this is very mild and does not bother her.  She denies chest pain, shortness of breath.  She has been walking a bit and has been tolerating activity well.  Denies pain near her right radial cath site.  She does note that she has been having headaches since getting discharged from the  hospital.  Thinks that this is due to her medication adjustments.  I offered holding metoprolol for a while to see if headaches improved, but patient declined.  She denies blood in urine, stools.  This morning, she spit up a little bit of blood.  She is unable to tell where this blood came from, could not see any bleeding in her mouth but could taste blood.  Denies any other issues with bleeding.  Patient reports that her job is very stressful and she has some concerns about going back.  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  Believes that stress is one of her bigger health concerns.  She has been working to make some dietary changes and cut out salt from her diet.  ROS: Denies chest pain, shortness of breath, dizziness, syncope, near syncope. Has headaches.   Studies Reviewed: .   Cardiac Studies & Procedures   CARDIAC CATHETERIZATION  CARDIAC CATHETERIZATION 08/23/2023  Narrative   Dist Cx lesion is 99% stenosed.   Mid RCA lesion is 45% stenosed.   A drug-eluting stent was successfully placed using a STENT ONYX FRONTIER 2.5X18.   Post intervention, there is a 0% residual stenosis.  POST-OPERATIVE DIAGNOSIS: Severe single-vessel CAD with culprit lesion being 90% subtotal occlusion of distal LCx into OM 2 crossing AV groove LCx with TIMI I flow and dye staining. Successful DES PCI revascularization of distal LCx into OM 3 using Onyx frontier DES 2.5 mm x 18 mm deployed to 16 ATM x 30 seconds.  Did not require postdilation as this was adequate  postdilation to 2.6 mm.  TIMI-3 flow restored. Otherwise no significant CAD, 40% mid RCA and minimal luminal irregularities in the LAD with a large bifurcating diagonal branch. Preserved LVEF with mild inferolateral hypokinesis on LV gram.  LVEDP initially was 28 mmHg, post PCI reduced to 17 mmHg.   RECOMMENDATIONS As long as the patient continues to meet low risk STEMI criteria and in the  absence of any other complications or medical issues, we expect the patient to be ready for discharge on 08/24/2023. Recommend uninterrupted dual antiplatelet therapy with Aspirin 81mg  daily and Ticagrelor 90mg  twice daily for a minimum of 12 months (ACS-Class I recommendation). Gentle post cath hydration Check 2D Echo to assess EF Initiate high-dose statin (LDL 101) Target < 55 Borderline BP-holding home meds until tomorrow, hold amlodipine pending determination of using beta-blocker over a calcium channel blocker in setting of MI/CAD.   Bryan Lemma, MD  Findings Coronary Findings Diagnostic  Dominance: Co-dominant  Left Main Vessel was injected. Vessel is large. Vessel is angiographically normal.  Left Anterior Descending Vessel is large. The vessel exhibits minimal luminal irregularities.  First Diagonal Branch Vessel is large in size.  Left Circumflex Vessel is large. Dist Cx lesion is 99% stenosed. Vessel is the culprit lesion. The lesion is type B1, located at the bifurcation, segmental, irregular and ulcerative.  First Obtuse Marginal Branch Vessel is small in size.  Second Obtuse Marginal Branch Vessel is large in size.  Left Posterior Atrioventricular Artery Vessel is small in size.  Right Coronary Artery Vessel was injected. Vessel is moderate in size. There is moderate focal disease in the vessel. Mid RCA lesion is 45% stenosed.  Acute Marginal Branch Vessel is small in size.  Right Ventricular Branch Vessel is small in size.  Right Posterior Descending Artery Vessel is small in size.  Intervention  Dist Cx lesion Stent A drug-eluting stent was successfully placed using a STENT ONYX FRONTIER 2.5X18. Maximum pressure: 16 atm. Inflation time: 30 sec. Minimum lumen area:  2.6 mm. Stent strut is well apposed. Deployed to high atmospheres-adequately deployed Post-stent angioplasty was not performed. Post-Intervention Lesion Assessment The intervention  was successful. Pre-interventional TIMI flow is 1. Post-intervention TIMI flow is 3. Treated lesion length:  18 mm. No complications occurred at this lesion. There is a 0% residual stenosis post intervention.     ECHOCARDIOGRAM  ECHOCARDIOGRAM COMPLETE 08/24/2023  Narrative ECHOCARDIOGRAM REPORT    Patient Name:   Alicia Rosario Date of Exam: 08/24/2023 Medical Rec #:  [id number]        Height:       64.0 in Accession #:    [id number]       Weight:       217.0 lb Date of Birth:  1981-01-26        BSA:          2.025 m Patient Age:    42 years         BP:           114/78 mmHg Patient Gender: F                HR:           60 bpm. Exam Location:  Inpatient  Procedure: 2D Echo, Cardiac Doppler and Color Doppler  Indications:    Acute myocardial infarction  History:        Patient has no prior history of Echocardiogram examinations. Arrythmias:PAC.  Sonographer:    Karma Ganja Referring Phys: [phone] DAVID  W HARDING  IMPRESSIONS   1. Left ventricular ejection fraction, by estimation, is 50 to 55%. The left ventricle has low normal function. The left ventricle has no regional wall motion abnormalities. Left ventricular diastolic parameters were normal. 2. Right ventricular systolic function is normal. The right ventricular size is normal. There is normal pulmonary artery systolic pressure. 3. The mitral valve is normal in structure. No evidence of mitral valve regurgitation. No evidence of mitral stenosis. 4. The aortic valve is normal in structure. Aortic valve regurgitation is not visualized. No aortic stenosis is present. 5. The inferior vena cava is normal in size with greater than 50% respiratory variability, suggesting right atrial pressure of 3 mmHg.  FINDINGS Left Ventricle: Left ventricular ejection fraction, by estimation, is 50 to 55%. The left ventricle has low normal function. The left ventricle has no regional wall motion abnormalities. The left ventricular internal  cavity size was normal in size. There is no left ventricular hypertrophy. Left ventricular diastolic parameters were normal.  Right Ventricle: The right ventricular size is normal. No increase in right ventricular wall thickness. Right ventricular systolic function is normal. There is normal pulmonary artery systolic pressure. The tricuspid regurgitant velocity is 1.78 m/s, and with an assumed right atrial pressure of 3 mmHg, the estimated right ventricular systolic pressure is 15.7 mmHg.  Left Atrium: Left atrial size was normal in size.  Right Atrium: Right atrial size was normal in size.  Pericardium: There is no evidence of pericardial effusion.  Mitral Valve: The mitral valve is normal in structure. No evidence of mitral valve regurgitation. No evidence of mitral valve stenosis.  Tricuspid Valve: The tricuspid valve is normal in structure. Tricuspid valve regurgitation is not demonstrated. No evidence of tricuspid stenosis.  Aortic Valve: The aortic valve is normal in structure. Aortic valve regurgitation is not visualized. No aortic stenosis is present. Aortic valve mean gradient measures 3.0 mmHg. Aortic valve peak gradient measures 5.1 mmHg. Aortic valve area, by VTI measures 2.35 cm.  Pulmonic Valve: The pulmonic valve was normal in structure. Pulmonic valve regurgitation is not visualized. No evidence of pulmonic stenosis.  Aorta: The aortic root is normal in size and structure.  Venous: The inferior vena cava is normal in size with greater than 50% respiratory variability, suggesting right atrial pressure of 3 mmHg.  IAS/Shunts: No atrial level shunt detected by color flow Doppler.   LEFT VENTRICLE PLAX 2D LVIDd:         5.10 cm   Diastology LVIDs:         3.90 cm   LV e' medial:    9.46 cm/s LV PW:         0.90 cm   LV E/e' medial:  9.9 LV IVS:        0.70 cm   LV e' lateral:   10.10 cm/s LVOT diam:     2.10 cm   LV E/e' lateral: 9.2 LV SV:         47 LV SV Index:    23 LVOT Area:     3.46 cm   RIGHT VENTRICLE             IVC RV Basal diam:  3.50 cm     IVC diam: 1.40 cm RV S prime:     12.30 cm/s TAPSE (M-mode): 2.0 cm  LEFT ATRIUM             Index        RIGHT ATRIUM  Index LA diam:        3.70 cm 1.83 cm/m   RA Area:     19.40 cm LA Vol (A2C):   42.6 ml 21.03 ml/m  RA Volume:   61.10 ml  30.17 ml/m LA Vol (A4C):   20.8 ml 10.27 ml/m LA Biplane Vol: 29.5 ml 14.57 ml/m AORTIC VALVE AV Area (Vmax):    2.12 cm AV Area (Vmean):   1.88 cm AV Area (VTI):     2.35 cm AV Vmax:           113.00 cm/s AV Vmean:          80.100 cm/s AV VTI:            0.202 m AV Peak Grad:      5.1 mmHg AV Mean Grad:      3.0 mmHg LVOT Vmax:         69.20 cm/s LVOT Vmean:        43.500 cm/s LVOT VTI:          0.137 m LVOT/AV VTI ratio: 0.68  AORTA Ao Root diam: 3.10 cm Ao Asc diam:  3.10 cm  MITRAL VALVE               TRICUSPID VALVE MV Area (PHT): 4.68 cm    TR Peak grad:   12.7 mmHg MV Decel Time: 162 msec    TR Vmax:        178.00 cm/s MV E velocity: 93.40 cm/s MV A velocity: 68.30 cm/s  SHUNTS MV E/A ratio:  1.37        Systemic VTI:  0.14 m Systemic Diam: 2.10 cm  Aditya Sabharwal Electronically signed by Dorthula Nettles Signature Date/Time: 08/24/2023/2:39:17 PM    Final             Risk Assessment/Calculations:             Physical Exam:   VS:  BP 114/88   Pulse 83   Ht 5\' 4"  (1.626 m)   Wt 221 lb (100.2 kg)   SpO2 98%   BMI 37.93 kg/m    Wt Readings from Last 3 Encounters:  09/02/23 221 lb (100.2 kg)  08/24/23 220 lb 3.2 oz (99.9 kg)  02/19/21 217 lb 9.6 oz (98.7 kg)    GEN: Well nourished, well developed in no acute distress. Sitting comfortably on the exam table in no acute distress  NECK: No JVD CARDIAC: RRR, no murmurs, rubs, gallops. Radial pulses 2+ bilaterally. Right radial cath site is soft, nontender  RESPIRATORY:  Clear to auscultation without rales, wheezing or rhonchi. Normal work of breathing  on room air   ABDOMEN: Soft, non-tender, non-distended EXTREMITIES:  No edema in BLE; No deformity   ASSESSMENT AND PLAN: .    STEMI CAD -Underwent cardiac catheterization for acute STEMI on 10/27.  Treated with PCI/DES x 1 to distal left circumflex.  High-sensitivity troponin peaked at greater than 24,000. -Echocardiogram showed EF 50-55%, no regional wall motion abnormalities, normal RV function - Patient denies chest pain, shortness of breath. She has been starting to walk a bit and is tolerating activity well. Does have some left shoulder pain that is worse with moving her shoulder. Pain is relieved by tylenol and does not bother her. Suspect this is musculoskeletal pain  - EKG today showed normal sinus rhythm with incomplete RBBB, inverted T waves in leads III, aVF. Unchanged from EKG post-catheterization  on 10/29  - Right radial cath site soft,  nontender. Healing well  - Continue DAPT with ASA, Brilinta - patient had one episode of mild mouth bleeding this AM. No bleeding otherwise.  - Continue Troprol-XL 25 mg daily  - Continue lipitor 80 mg daily   HLD  -In 07/2023, LDL 101, HDL 35.  Lipoprotein a 90.4 - Now on lipitor 80 mg daily  - Needs Lipid panel, LFTs in 8 weeks   HTN  - BP well controlled  - Continue metoprolol succinate 25 mg daily and losartan 25 mg daily  - Creatinine 0.65 and K 3.9 on 10/29  - Discussed low sodium diet and encouraged her to increase physical activity as tolerated   Depression Generalized Anxiety Disorder  - Continue effexor   Headache - Patient reports having headaches since cardiac catheterization. Headaches feel like a "squeeze", sometimes last up to 2 hours at a time. Denies vision changes, dizziness, weakness - Patient wondered if headache is a side effect from medications. She is not taking sl nitroglycerin. Offered trial off metoprolol, but patient would prefer to keep taking all of her cardiac medications  - Follow up with PCP      Cardiac Rehabilitation Eligibility Assessment  The patient is ready to start cardiac rehabilitation from a cardiac standpoint.       Dispo: Follow up in 2 months with APP   Signed, Jonita Albee, PA-C

## 2023-09-02 ENCOUNTER — Encounter: Payer: Self-pay | Admitting: Cardiology

## 2023-09-02 ENCOUNTER — Ambulatory Visit: Payer: Medicaid Other | Attending: Cardiology | Admitting: Cardiology

## 2023-09-02 VITALS — BP 114/88 | HR 83 | Ht 64.0 in | Wt 221.0 lb

## 2023-09-02 DIAGNOSIS — E785 Hyperlipidemia, unspecified: Secondary | ICD-10-CM

## 2023-09-02 DIAGNOSIS — I10 Essential (primary) hypertension: Secondary | ICD-10-CM | POA: Diagnosis not present

## 2023-09-02 DIAGNOSIS — F411 Generalized anxiety disorder: Secondary | ICD-10-CM

## 2023-09-02 DIAGNOSIS — F322 Major depressive disorder, single episode, severe without psychotic features: Secondary | ICD-10-CM

## 2023-09-02 DIAGNOSIS — I2119 ST elevation (STEMI) myocardial infarction involving other coronary artery of inferior wall: Secondary | ICD-10-CM

## 2023-09-02 DIAGNOSIS — I2510 Atherosclerotic heart disease of native coronary artery without angina pectoris: Secondary | ICD-10-CM

## 2023-09-02 NOTE — Patient Instructions (Signed)
Medication Instructions:  No changes *If you need a refill on your cardiac medications before your next appointment, please call your pharmacy*   Lab Work: No labs   Testing/Procedures: No testing   Follow-Up: At [facility], you and your health needs are our priority.  As part of our continuing mission to provide you with exceptional heart care, we have created designated Provider Care Teams.  These Care Teams include your primary Cardiologist (physician) and Advanced Practice Providers (APPs -  Physician Assistants and Nurse Practitioners) who all work together to provide you with the care you need, when you need it.  We recommend signing up for the patient portal called "MyChart".  Sign up information is provided on this After Visit Summary.  MyChart is used to connect with patients for Virtual Visits (Telemedicine).  Patients are able to view lab/test results, encounter notes, upcoming appointments, etc.  Non-urgent messages can be sent to your provider as well.   To learn more about what you can do with MyChart, go to [email or web address].    Your next appointment:   6-8 week(s)  Provider:   Any APP

## 2023-09-03 ENCOUNTER — Telehealth: Payer: Self-pay | Admitting: Cardiology

## 2023-09-03 NOTE — Telephone Encounter (Signed)
Patient stated she will need to get paperwork releasing her to return to work which shows she was out on medical leave.  Patient stated she will need to drop off form to be completed before she can return to work next week.

## 2023-09-03 NOTE — Telephone Encounter (Signed)
Left voicemail to return call to office.  Scheduling when patient return call to office please advise her to bring form to front desk for Alicia Rosario to complete.

## 2023-09-03 NOTE — Telephone Encounter (Signed)
Spoke with patient and she states she will need FMLA paperwork to be completed. She states she was seen yesterday but the provider told her to take to the front desk.   She states she the lady at the front desk did not help her and asked if she can help the gentleman behind her first so she left. She would like to know who can fill out her FMLA paperwork.  Will Dr. Herbie Baltimore complete it since he saw her in the hospital?

## 2023-09-08 ENCOUNTER — Telehealth: Payer: Self-pay | Admitting: Cardiology

## 2023-09-08 DIAGNOSIS — Z0279 Encounter for issue of other medical certificate: Secondary | ICD-10-CM

## 2023-09-08 NOTE — Telephone Encounter (Signed)
Pt completed all FMLA forms (ROI & Billing) + $29 processing fee.   Billing forms faxed. Paperwork given to [employer] for Alicia Rosario.   JB, 09-08-23

## 2023-09-08 NOTE — Telephone Encounter (Signed)
Completed paperwork by Adin Hector faxed to [facility]. Pt made aware of faxed information & will be contacted if further information is needed.  JB, 09-08-23

## 2023-10-08 NOTE — Progress Notes (Signed)
Cardiology Office Note:  .   Date:  10/13/2023  ID:  Alicia Rosario, DOB January 04, 1981, MRN [id number] PCP: [facility] Providers Cardiologist:  Bryan Lemma, MD }   History of Present Illness: Alicia Rosario is a 42 y.o. female with a past medical history of recent STEMI, Catheterization on 08/23/2023 showed severe single-vessel CAD with culprit lesion being 90% subtotal occlusion of distal left circumflex. Underwent successful DES revascularization of distal left circumflex.  There was otherwise 40% mid RCA disease and minimal luminal irregularities in the LAD.  Recommended uninterrupted dual antiplatelet therapy with aspirin and Brilinta for minimum of 12 months.   major depressive disorder, generalized anxiety disorder, hypertension, hyperlipidemia. Last seen by Robet Leu, PA on 09/02/2023.  At that time she was stable from cardiac standpoint she was having some shoulder pain worse with range of motion and not related to cardiac etiology.  No medication changes were made and no new testing was ordered.  She comes today feeling about the same.  She has been noticing that her heart rate has been elevated.  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  She states that she notices during stress or when she has been on her feet for a while, her blood pressure does go up and her heart rate is elevated.  She goes and sits down and relaxes and it does get some better but her heart rate remains elevated.  She states that the "bottom number" still is running in the 80s despite current medication regimen.  She denies any syncope, dizziness, fatigue, or shortness of breath.  She denies any bleeding, bruising, or hemoptysis on Brilinta.    ROS: As above otherwise negative  Studies Reviewed: Marland Kitchen      [facility] 08/23/2023 Dist Cx lesion is 99% stenosed.   Mid RCA lesion is 45% stenosed.   A drug-eluting stent was successfully placed using a STENT ONYX  FRONTIER 2.5X18.   Post intervention, there is a 0% residual stenosis.   POST-OPERATIVE DIAGNOSIS:  Severe single-vessel CAD with culprit lesion being 90% subtotal occlusion of distal LCx into OM 2 crossing AV groove LCx with TIMI I flow and dye staining. Successful DES PCI revascularization of distal LCx into OM 3 using Onyx frontier DES 2.5 mm x 18 mm deployed to 16 ATM x 30 seconds.  Did not require postdilation as this was adequate postdilation to 2.6 mm.  TIMI-3 flow restored. Otherwise no significant CAD, 40% mid RCA and minimal luminal irregularities in the LAD with a large bifurcating diagonal branch. Preserved LVEF with mild inferolateral hypokinesis on LV gram.  LVEDP initially was 28 mmHg, post PCI reduced to 17 mmHg.   Diagnostic Dominance: Co-dominant  Echocardiogram 08/24/2023 1. Left ventricular ejection fraction, by estimation, is 50 to 55%. The  left ventricle has low normal function. The left ventricle has no regional  wall motion abnormalities. Left ventricular diastolic parameters were  normal.   2. Right ventricular systolic function is normal. The right ventricular  size is normal. There is normal pulmonary artery systolic pressure.   3. The mitral valve is normal in structure. No evidence of mitral valve  regurgitation. No evidence of mitral stenosis.   4. The aortic valve is normal in structure. Aortic valve regurgitation is  not visualized. No aortic stenosis is present.   5. The inferior vena cava is normal in size with greater than 50%  respiratory variability, suggesting right  atrial pressure of 3 mmHg.   Physical Exam:   VS:  BP 120/80   Pulse 100   Ht 5\' 4"  (1.626 m)   Wt 221 lb 9.6 oz (100.5 kg)   SpO2 98%   BMI 38.04 kg/m    Wt Readings from Last 3 Encounters:  10/13/23 221 lb 9.6 oz (100.5 kg)  09/02/23 221 lb (100.2 kg)  08/24/23 220 lb 3.2 oz (99.9 kg)    GEN: Well nourished, well developed in no acute distress NECK: No JVD; No carotid  bruits CARDIAC: RRR, tachycardic , no murmurs, rubs, gallops RESPIRATORY:  Clear to auscultation without rales, wheezing or rhonchi  ABDOMEN: Soft, non-tender, non-distended EXTREMITIES:  No edema; No deformity   ASSESSMENT AND PLAN: .   CAD: History of severe single-vessel coronary artery disease with lesion of 90% subtotal occlusion of the distal left circumflex.  The patient does have a DES in the distal left circumflex which was placed on 08/23/2023.  She continues on Brilinta and aspirin and is tolerating the medication without any issues concerning bleeding or excessive bruising.  Continue secondary prevention with lipid management, blood pressure control, heart rate control, and weight loss.  2.  Hypertension: Patient's blood pressure has been essentially stable until she is stressed at work or very busy.  She notices that her blood pressure goes up and her heart rate becomes elevated.  At rest her heart rate does remain in the 90s to 100 bpm.  I am going to increase her metoprolol succinate to 37. 2 5 milligrams daily (1-1/2 tablet of the 25 mg dose).  She will follow her blood pressure at home and record this.  She will report blood pressures less than 110/60, or continued elevated heart rates greater than 100 at rest.  3.  Hypercholesterolemia: The patient remains on atorvastatin 80 mg daily.  Goal of LDL less than 70.  The patient will have repeat lipid panel 1 following up in 3 months.      Cardiac Rehabilitation Eligibility Assessment           Signed, Bettey Mare. Liborio Nixon, ANP, AACC

## 2023-10-13 ENCOUNTER — Ambulatory Visit: Payer: Medicaid Other | Attending: Adult Health | Admitting: Adult Health

## 2023-10-13 ENCOUNTER — Encounter: Payer: Self-pay | Admitting: Adult Health

## 2023-10-13 ENCOUNTER — Other Ambulatory Visit (HOSPITAL_COMMUNITY): Payer: Self-pay

## 2023-10-13 VITALS — BP 120/80 | HR 100 | Ht 64.0 in | Wt 221.6 lb

## 2023-10-13 DIAGNOSIS — R000 Tachycardia, unspecified: Secondary | ICD-10-CM | POA: Diagnosis not present

## 2023-10-13 DIAGNOSIS — I10 Essential (primary) hypertension: Secondary | ICD-10-CM | POA: Diagnosis not present

## 2023-10-13 MED ORDER — METOPROLOL SUCCINATE ER 25 MG PO TB24
37.5000 mg | ORAL_TABLET | Freq: Every day | ORAL | 3 refills | Status: DC
  Filled 2023-10-13: qty 45, 30d supply, fill #0

## 2023-10-13 NOTE — Patient Instructions (Signed)
Medication Instructions:  Increase Metoprolol to 37.5 mg ( Take 1 1/2 Tablets Daily) *If you need a refill on your cardiac medications before your next appointment, please call your pharmacy*   Lab Work: No Labs If you have labs (blood work) drawn today and your tests are completely normal, you will receive your results only by: MyChart Message (if you have MyChart) OR A paper copy in the mail If you have any lab test that is abnormal or we need to change your treatment, we will call you to review the results.   Testing/Procedures: No Testing   Follow-Up: At [facility], you and your health needs are our priority.  As part of our continuing mission to provide you with exceptional heart care, we have created designated Provider Care Teams.  These Care Teams include your primary Cardiologist (physician) and Advanced Practice Providers (APPs -  Physician Assistants and Nurse Practitioners) who all work together to provide you with the care you need, when you need it.  We recommend signing up for the patient portal called "MyChart".  Sign up information is provided on this After Visit Summary.  MyChart is used to connect with patients for Virtual Visits (Telemedicine).  Patients are able to view lab/test results, encounter notes, upcoming appointments, etc.  Non-urgent messages can be sent to your provider as well.   To learn more about what you can do with MyChart, go to [email or web address].    Your next appointment:   3 month(s)  Provider:   Bryan Lemma, MD

## 2023-10-14 ENCOUNTER — Encounter (HOSPITAL_COMMUNITY): Payer: Self-pay | Admitting: Emergency Medicine

## 2023-10-14 ENCOUNTER — Other Ambulatory Visit: Payer: Self-pay

## 2023-10-14 ENCOUNTER — Emergency Department (HOSPITAL_COMMUNITY): Payer: Medicaid Other

## 2023-10-14 ENCOUNTER — Non-Acute Institutional Stay (HOSPITAL_COMMUNITY): Admit: 2023-10-14 | Payer: Medicaid Other | Admitting: Cardiology

## 2023-10-14 ENCOUNTER — Emergency Department (HOSPITAL_COMMUNITY)
Admission: EM | Admit: 2023-10-14 | Discharge: 2023-10-14 | Discharge status: Against Medical Advice | Payer: Medicaid Other | Attending: Emergency Medicine | Admitting: Emergency Medicine

## 2023-10-14 DIAGNOSIS — R0602 Shortness of breath: Secondary | ICD-10-CM | POA: Diagnosis not present

## 2023-10-14 DIAGNOSIS — Z5321 Procedure and treatment not carried out due to patient leaving prior to being seen by health care provider: Secondary | ICD-10-CM | POA: Diagnosis not present

## 2023-10-14 DIAGNOSIS — R112 Nausea with vomiting, unspecified: Secondary | ICD-10-CM | POA: Insufficient documentation

## 2023-10-14 DIAGNOSIS — R079 Chest pain, unspecified: Secondary | ICD-10-CM | POA: Diagnosis present

## 2023-10-14 LAB — CBC
HCT: 40.8 % (ref 36.0–46.0)
Hemoglobin: 13.5 g/dL (ref 12.0–15.0)
MCH: 24.8 pg — ABNORMAL LOW (ref 26.0–34.0)
MCHC: 33.1 g/dL (ref 30.0–36.0)
MCV: 74.9 fL — ABNORMAL LOW (ref 80.0–100.0)
Platelets: 205 10*3/uL (ref 150–400)
RBC: 5.45 MIL/uL — ABNORMAL HIGH (ref 3.87–5.11)
RDW: 13.5 % (ref 11.5–15.5)
WBC: 7.3 10*3/uL (ref 4.0–10.5)
nRBC: 0 % (ref 0.0–0.2)

## 2023-10-14 LAB — BASIC METABOLIC PANEL
Anion gap: 13 (ref 5–15)
BUN: 7 mg/dL (ref 6–20)
CO2: 21 mmol/L — ABNORMAL LOW (ref 22–32)
Calcium: 8.6 mg/dL — ABNORMAL LOW (ref 8.9–10.3)
Chloride: 100 mmol/L (ref 98–111)
Creatinine, Ser: 0.72 mg/dL (ref 0.44–1.00)
GFR, Estimated: >60 mL/min (ref >60)
Glucose, Bld: 90 mg/dL (ref 70–99)
Potassium: 3.6 mmol/L (ref 3.5–5.1)
Sodium: 134 mmol/L — ABNORMAL LOW (ref 135–145)

## 2023-10-14 LAB — HCG, SERUM, QUALITATIVE: Preg, Serum: NEGATIVE

## 2023-10-14 LAB — TROPONIN I (HIGH SENSITIVITY): Troponin I (High Sensitivity): 7 ng/L (ref <18)

## 2023-10-14 NOTE — ED Triage Notes (Signed)
Pt reports hx of MI.  Had cards routine appt yesterday and was tachycardic and told to increase her metoprolol.  No chest pain at that time. Today has not been able to find a position of comfort, left sided chest pain radiating to left arm, and 1 episode of vomiting which she states had blood in it. Pt lying flat in triage d/t discomfort.

## 2023-10-14 NOTE — ED Provider Triage Note (Signed)
Emergency Medicine Provider Triage Evaluation Note  Alicia Rosario , a 42 y.o. female  was evaluated in triage.  Pt complains of left-sided chest pain that radiates down the left arm.  She states that initially started in her back but is now since progressed to the left side of her chest.  Patient does have a recent MI back in October requiring a cardiac stent.  She has been taking Brilinta and has not missed any doses.  She reports associated shortness of breath, nausea, vomiting, but denies any diaphoresis or diarrhea.  Review of Systems  Positive:  Negative: See above   Physical Exam  BP 131/86   Pulse (!) 106   Temp 98.4 F (36.9 C) (Oral)   Resp 17   SpO2 100%  Gen:   Awake, no distress   Resp:  Normal effort  MSK:   Moves extremities without difficulty  Other:  Tachycardic.  Medical Decision Making  Medically screening exam initiated at 5:45 PM.  Appropriate orders placed.  [name] was informed that the remainder of the evaluation will be completed by another provider, this initial triage assessment does not replace that evaluation, and the importance of remaining in the ED until their evaluation is complete.  Concerning story for ACS.   Honor Loh [place], [place] 10/14/23 1746

## 2023-10-15 ENCOUNTER — Emergency Department (HOSPITAL_COMMUNITY)
Admission: EM | Admit: 2023-10-15 | Discharge: 2023-10-15 | Disposition: A | Discharge status: Home / Self Care | Payer: Medicaid Other | Attending: Emergency Medicine | Admitting: Emergency Medicine

## 2023-10-15 ENCOUNTER — Emergency Department (HOSPITAL_COMMUNITY): Payer: Medicaid Other

## 2023-10-15 ENCOUNTER — Telehealth: Payer: Medicaid Other

## 2023-10-15 ENCOUNTER — Other Ambulatory Visit: Payer: Self-pay

## 2023-10-15 DIAGNOSIS — R0602 Shortness of breath: Secondary | ICD-10-CM | POA: Diagnosis not present

## 2023-10-15 DIAGNOSIS — R1013 Epigastric pain: Secondary | ICD-10-CM | POA: Insufficient documentation

## 2023-10-15 DIAGNOSIS — Z7982 Long term (current) use of aspirin: Secondary | ICD-10-CM | POA: Diagnosis not present

## 2023-10-15 DIAGNOSIS — R112 Nausea with vomiting, unspecified: Secondary | ICD-10-CM | POA: Insufficient documentation

## 2023-10-15 DIAGNOSIS — K529 Noninfective gastroenteritis and colitis, unspecified: Secondary | ICD-10-CM

## 2023-10-15 DIAGNOSIS — R0789 Other chest pain: Secondary | ICD-10-CM | POA: Diagnosis not present

## 2023-10-15 DIAGNOSIS — K2970 Gastritis, unspecified, without bleeding: Secondary | ICD-10-CM

## 2023-10-15 DIAGNOSIS — R197 Diarrhea, unspecified: Secondary | ICD-10-CM | POA: Insufficient documentation

## 2023-10-15 LAB — COMPREHENSIVE METABOLIC PANEL
ALT: 18 U/L (ref 0–44)
AST: 22 U/L (ref 15–41)
Albumin: 3.6 g/dL (ref 3.5–5.0)
Alkaline Phosphatase: 81 U/L (ref 38–126)
Anion gap: 7 (ref 5–15)
BUN: 5 mg/dL — ABNORMAL LOW (ref 6–20)
CO2: 23 mmol/L (ref 22–32)
Calcium: 8.8 mg/dL — ABNORMAL LOW (ref 8.9–10.3)
Chloride: 105 mmol/L (ref 98–111)
Creatinine, Ser: 0.71 mg/dL (ref 0.44–1.00)
GFR, Estimated: >60 mL/min (ref >60)
Glucose, Bld: 91 mg/dL (ref 70–99)
Potassium: 3.4 mmol/L — ABNORMAL LOW (ref 3.5–5.1)
Sodium: 135 mmol/L (ref 135–145)
Total Bilirubin: 0.7 mg/dL (ref <1.2)
Total Protein: 7.3 g/dL (ref 6.5–8.1)

## 2023-10-15 LAB — CBC
HCT: 41.9 % (ref 36.0–46.0)
Hemoglobin: 14 g/dL (ref 12.0–15.0)
MCH: 24.9 pg — ABNORMAL LOW (ref 26.0–34.0)
MCHC: 33.4 g/dL (ref 30.0–36.0)
MCV: 74.4 fL — ABNORMAL LOW (ref 80.0–100.0)
Platelets: 243 10*3/uL (ref 150–400)
RBC: 5.63 MIL/uL — ABNORMAL HIGH (ref 3.87–5.11)
RDW: 13.5 % (ref 11.5–15.5)
WBC: 6.3 10*3/uL (ref 4.0–10.5)
nRBC: 0 % (ref 0.0–0.2)

## 2023-10-15 LAB — TROPONIN I (HIGH SENSITIVITY)
Troponin I (High Sensitivity): 9 ng/L (ref <18)
Troponin I (High Sensitivity): 9 ng/L (ref <18)

## 2023-10-15 LAB — URINALYSIS, ROUTINE W REFLEX MICROSCOPIC
Bacteria, UA: NONE SEEN
Bilirubin Urine: NEGATIVE
Glucose, UA: NEGATIVE mg/dL
Ketones, ur: NEGATIVE mg/dL
Leukocytes,Ua: NEGATIVE
Nitrite: NEGATIVE
Protein, ur: NEGATIVE mg/dL
Specific Gravity, Urine: 1.008 (ref 1.005–1.030)
pH: 6 (ref 5.0–8.0)

## 2023-10-15 LAB — LIPASE, BLOOD: Lipase: 22 U/L (ref 11–51)

## 2023-10-15 MED ORDER — TICAGRELOR 90 MG PO TABS
90.0000 mg | ORAL_TABLET | Freq: Once | ORAL | Status: AC
Start: 2023-10-15 — End: 2023-10-15
  Administered 2023-10-15: 90 mg via ORAL
  Filled 2023-10-15: qty 1

## 2023-10-15 MED ORDER — MORPHINE SULFATE (PF) 4 MG/ML IV SOLN
4.0000 mg | Freq: Once | INTRAVENOUS | Status: AC
Start: 2023-10-15 — End: 2023-10-15
  Administered 2023-10-15: 4 mg via INTRAVENOUS
  Filled 2023-10-15: qty 1

## 2023-10-15 MED ORDER — FAMOTIDINE IN NACL 20-0.9 MG/50ML-% IV SOLN
20.0000 mg | Freq: Once | INTRAVENOUS | Status: AC
  Administered 2023-10-15: 20 mg via INTRAVENOUS
  Filled 2023-10-15: qty 50

## 2023-10-15 MED ORDER — LIDOCAINE VISCOUS HCL 2 % MT SOLN
15.0000 mL | Freq: Once | OROMUCOSAL | Status: AC
  Administered 2023-10-15: 15 mL via ORAL
  Filled 2023-10-15: qty 15

## 2023-10-15 MED ORDER — ALUM & MAG HYDROXIDE-SIMETH 200-200-20 MG/5ML PO SUSP
30.0000 mL | Freq: Once | ORAL | Status: AC
  Administered 2023-10-15: 30 mL via ORAL
  Filled 2023-10-15: qty 30

## 2023-10-15 MED ORDER — PANTOPRAZOLE SODIUM 40 MG IV SOLR
40.0000 mg | Freq: Once | INTRAVENOUS | Status: AC
  Administered 2023-10-15: 40 mg via INTRAVENOUS
  Filled 2023-10-15: qty 10

## 2023-10-15 MED ORDER — ONDANSETRON 4 MG PO TBDP: 4.0000 mg | ORAL_TABLET | Freq: Three times a day (TID) | ORAL | 0 refills | Status: DC | PRN

## 2023-10-15 MED ORDER — SUCRALFATE 1 G PO TABS: 1.0000 g | ORAL_TABLET | Freq: Three times a day (TID) | ORAL | 0 refills | Status: DC

## 2023-10-15 MED ORDER — PANTOPRAZOLE SODIUM 40 MG PO TBEC
40.0000 mg | DELAYED_RELEASE_TABLET | Freq: Every day | ORAL | 0 refills | Status: AC
Start: 2023-10-15 — End: ?

## 2023-10-15 MED ORDER — CALCIUM CARBONATE ANTACID 500 MG PO CHEW
2.0000 | CHEWABLE_TABLET | Freq: Once | ORAL | Status: AC
  Administered 2023-10-15: 400 mg via ORAL
  Filled 2023-10-15: qty 2

## 2023-10-15 MED ORDER — SUCRALFATE 1 G PO TABS
1.0000 g | ORAL_TABLET | Freq: Once | ORAL | Status: AC
  Administered 2023-10-15: 1 g via ORAL
  Filled 2023-10-15: qty 1

## 2023-10-15 MED ORDER — IOHEXOL 350 MG/ML SOLN
75.0000 mL | Freq: Once | INTRAVENOUS | Status: AC | PRN
  Administered 2023-10-15: 75 mL via INTRAVENOUS

## 2023-10-15 MED ORDER — ONDANSETRON HCL 4 MG/2ML IJ SOLN
4.0000 mg | Freq: Once | INTRAMUSCULAR | Status: AC
  Administered 2023-10-15: 4 mg via INTRAVENOUS
  Filled 2023-10-15: qty 2

## 2023-10-15 MED ORDER — ATORVASTATIN CALCIUM 40 MG PO TABS
80.0000 mg | ORAL_TABLET | Freq: Every day | ORAL | Status: DC
  Administered 2023-10-15: 80 mg via ORAL
  Filled 2023-10-15: qty 2

## 2023-10-15 NOTE — ED Provider Notes (Signed)
Handoff from [place]. C/o epigastric pain, N/V/D. Hx of MI, EKG and labs are reassuring. Given GI cocktail, and pending xray. 3 negative troponins in last 24 hours.   Physical Exam  BP 116/85 (BP Location: Left Arm)   Pulse 97   Temp 98.9 F (37.2 C) (Oral)   Resp 18   SpO2 97%   Physical Exam Vitals and nursing note reviewed.  Constitutional:      General: She is not in acute distress.    Appearance: She is well-developed.  HENT:     Head: Normocephalic and atraumatic.  Eyes:     Conjunctiva/sclera: Conjunctivae normal.  Cardiovascular:     Rate and Rhythm: Normal rate and regular rhythm.     Heart sounds: No murmur heard. Pulmonary:     Effort: Pulmonary effort is normal. No respiratory distress.     Breath sounds: Normal breath sounds.  Abdominal:     Palpations: Abdomen is soft.     Tenderness: There is no abdominal tenderness.  Musculoskeletal:        General: No swelling.     Cervical back: Neck supple.  Skin:    General: Skin is warm and dry.     Capillary Refill: Capillary refill takes less than 2 seconds.  Neurological:     Mental Status: She is alert.  Psychiatric:        Mood and Affect: Mood normal.     Procedures  Procedures  ED Course / MDM   Clinical Course as of 10/15/23 1613  Thu Oct 15, 2023  1507 Glucose, UA: NEGATIVE [BS]    Clinical Course User Index [BS] Kaelan Amble, [place], PA   Medical Decision Making Patient is a 42 year old female, here for epigastric pain, she has had multiple negative troponins, I reassessed her after a GI cocktail, and she still states that she has severe epigastric pain, she has no shortness of breath thus I think this is unlikely to be respiratory wise.  We will obtain a CT abdomen pelvis, and further evaluate.  I have also added Carafate, and Tums, to help with her abdominal discomfort  Amount and/or Complexity of Data Reviewed Labs: ordered. Decision-making details documented in ED Course.    Details: Labs  unremarkable Radiology: ordered.    Details: KUB shows findings concerning for ileus, CT abdomen pelvis ordered, shows colitis, and findings concerning for gastritis Discussion of management or test interpretation with external provider(s): Discussed with patient, she is feeling better after the Tums, and Carafate.  A CT abdomen pelvis was ordered, given the KUB findings, which showed colitis and findings concerning for gastritis.  I started on pantoprazole as well as some Carafate for home, in case of peptic ulcer disease, and encouraged her to follow-up with the PCP.  We discussed return precautions and she was discharged home.  Tolerated p.o. trial  Risk OTC drugs. Prescription drug management.         Pete Pelt, [place] 10/15/23 2145    Anders Simmonds T, DO 10/16/23 1755

## 2023-10-15 NOTE — Discharge Instructions (Addendum)
Please take the Protonix as prescribed, as well as the Carafate, and will help with your acid reflux/gastritis.  Make sure you are eating very bland foods, to help with your diarrhea.  Additionally I have provided you information, for food choices, that should be changed, for your gastritis.  Return to the ER if you have any rectal bleeding, intractable nausea, vomiting, or severe abdominal pain

## 2023-10-15 NOTE — ED Notes (Signed)
Pt asking if she can "cancel the CT, she is feeling better", EDPA notified.

## 2023-10-15 NOTE — ED Notes (Signed)
ED PA at BS 

## 2023-10-15 NOTE — ED Notes (Signed)
Asking for ice water, PO challenged.

## 2023-10-15 NOTE — ED Notes (Signed)
Pain returned with movement after CT.

## 2023-10-15 NOTE — ED Notes (Signed)
Pt agreeable to CT, in CT.

## 2023-10-15 NOTE — ED Triage Notes (Signed)
Pt. Stated, I feel like my stomach is on fire it hurts really bad with diarrhea. I do have some SOB. I was here yesterday and then I left didn't want to wait any longer.

## 2023-10-15 NOTE — ED Provider Notes (Signed)
[place] EMERGENCY DEPARTMENT AT [facility] Provider Note   CSN: [id number] Arrival date & time: 10/15/23  8657     History  Chief Complaint  Patient presents with   Abdominal Pain   Shortness of Breath    Alicia Rosario is a 42 y.o. female.  Alicia Rosario is a 42 y.o. female with a history of recent MI, IBS, bipolar disorder, who presents to the emergency department for evaluation of epigastric and left-sided abdominal pain.  She reports that symptoms have been present for the past 2 days.  She had some vomiting yesterday.  She also reports multiple episodes of nonbloody diarrhea today, denies melena.  She reports she had some chest discomfort yesterday but now pain is more persistent in the epigastric region.  She reports that she has been compliant with all of her cardiac meds, is on aspirin and Brilinta, has not taken any additional NSAIDs.  She denies alcohol or drug use.  Reports that she initially presented to the emergency department yesterday but left prior to being seen due to long wait.  Returned today due to persistent symptoms.  The history is provided by the patient and medical records.  Abdominal Pain Associated symptoms: diarrhea, nausea, shortness of breath and vomiting   Associated symptoms: no chills and no dysuria   Shortness of Breath Associated symptoms: abdominal pain and vomiting        Home Medications Prior to Admission medications   Medication Sig Start Date End Date Taking? Authorizing Provider  acetaminophen (TYLENOL) 500 MG tablet Take 1,000 mg by mouth 2 (two) times daily as needed for moderate pain (pain score 4-6) or headache.   Yes [provider]  aspirin EC 81 MG tablet Take 1 tablet (81 mg total) by mouth daily. Swallow whole. 08/26/23  Yes Arty Baumgartner, NP  atorvastatin (LIPITOR) 80 MG tablet Take 1 tablet (80 mg total) by mouth daily. Patient taking differently: Take 80 mg by mouth at bedtime. 08/26/23  Yes  Arty Baumgartner, NP  losartan (COZAAR) 25 MG tablet Take 25 mg by mouth daily.   Yes [provider]  metoprolol succinate (TOPROL XL) 25 MG 24 hr tablet Take 1.5 tablets (37.5 mg total) by mouth daily. 10/13/23  Yes Jodelle Gross, NP  nitroGLYCERIN (NITROSTAT) 0.4 MG SL tablet Place 1 tablet (0.4 mg total) under the tongue every 5 (five) minutes as needed. 08/25/23  Yes Laverda Page B, NP  pantoprazole (PROTONIX) 40 MG tablet Take 40 mg by mouth daily as needed (reflux).   Yes [provider]  ticagrelor (BRILINTA) 90 MG TABS tablet Take 1 tablet (90 mg total) by mouth 2 (two) times daily. 08/25/23  Yes Laverda Page B, NP  venlafaxine XR (EFFEXOR-XR) 75 MG 24 hr capsule Take 225 mg by mouth daily.   Yes [provider]      Allergies    Patient has no known allergies.    Review of Systems   Review of Systems  Constitutional:  Negative for chills and unexpected weight change.  Respiratory:  Positive for shortness of breath.   Gastrointestinal:  Positive for abdominal pain, diarrhea, nausea and vomiting. Negative for blood in stool.  Genitourinary:  Negative for dysuria and frequency.  Musculoskeletal:  Negative for arthralgias and myalgias.  Neurological:  Negative for syncope and light-headedness.  All other systems reviewed and are negative.   Physical Exam Updated Vital Signs BP 116/85 (BP Location: Left Arm)   Pulse 97  Temp 98.9 F (37.2 C) (Oral)   Resp 18   SpO2 97%  Physical Exam Vitals and nursing note reviewed.  Constitutional:      General: She is not in acute distress.    Appearance: Normal appearance. She is well-developed. She is not ill-appearing or diaphoretic.  HENT:     Head: Normocephalic and atraumatic.  Eyes:     General:        Right eye: No discharge.        Left eye: No discharge.     Pupils: Pupils are equal, round, and reactive to light.  Cardiovascular:     Rate and Rhythm: Normal rate and regular  rhythm.     Pulses: Normal pulses.     Heart sounds: Normal heart sounds.  Pulmonary:     Effort: Pulmonary effort is normal. No respiratory distress.     Breath sounds: Normal breath sounds. No wheezing or rales.     Comments: Respirations equal and unlabored, patient able to speak in full sentences, lungs clear to auscultation bilaterally  Abdominal:     General: Bowel sounds are normal. There is no distension.     Palpations: Abdomen is soft. There is no mass.     Tenderness: There is abdominal tenderness in the epigastric area and left upper quadrant. There is no guarding.     Comments: Abdomen soft, nondistended, bowel sounds present, tenderness noted in the epigastric region and left upper quadrant without guarding or rebound tenderness, all other quadrants nontender to palpation  Musculoskeletal:        General: No deformity.     Cervical back: Neck supple.  Skin:    General: Skin is warm and dry.     Capillary Refill: Capillary refill takes less than 2 seconds.  Neurological:     Mental Status: She is alert and oriented to person, place, and time.     Coordination: Coordination normal.     Comments: Speech is clear, able to follow commands CN III-XII intact Normal strength in upper and lower extremities bilaterally including dorsiflexion and plantar flexion, strong and equal grip strength Sensation normal to light and sharp touch Moves extremities without ataxia, coordination intact  Psychiatric:        Mood and Affect: Mood normal.        Behavior: Behavior normal.     ED Results / Procedures / Treatments   Labs (all labs ordered are listed, but only abnormal results are displayed) Labs Reviewed  COMPREHENSIVE METABOLIC PANEL - Abnormal; Notable for the following components:      Result Value   Potassium 3.4 (*)    BUN 5 (*)    Calcium 8.8 (*)    All other components within normal limits  CBC - Abnormal; Notable for the following components:   RBC 5.63 (*)    MCV  74.4 (*)    MCH 24.9 (*)    All other components within normal limits  URINALYSIS, ROUTINE W REFLEX MICROSCOPIC - Abnormal; Notable for the following components:   Hgb urine dipstick SMALL (*)    All other components within normal limits  LIPASE, BLOOD  TROPONIN I (HIGH SENSITIVITY)  TROPONIN I (HIGH SENSITIVITY)    EKG None  Radiology DG Chest 2 View Result Date: 10/14/2023 CLINICAL DATA:  Chest pain and tachycardia. EXAM: CHEST - 2 VIEW COMPARISON:  Chest radiograph dated 08/23/2023. FINDINGS: Renal bibasilar densities, left greater than right, likely atelectasis. Pneumonia is less likely but not excluded no focal  consolidation, pleural effusion, or pneumothorax. Stable cardiac silhouette. No acute osseous pathology. IMPRESSION: No focal consolidation.  Minimal bibasilar probable atelectasis. Electronically Signed   By: Elgie Collard M.D.   On: 10/14/2023 18:27    Procedures Procedures    Medications Ordered in ED Medications  alum & mag hydroxide-simeth (MAALOX/MYLANTA) 200-200-20 MG/5ML suspension 30 mL (has no administration in time range)    And  lidocaine (XYLOCAINE) 2 % viscous mouth solution 15 mL (has no administration in time range)  famotidine (PEPCID) IVPB 20 mg premix (20 mg Intravenous New Bag/Given 10/15/23 1511)  pantoprazole (PROTONIX) injection 40 mg (40 mg Intravenous Given 10/15/23 1511)  ondansetron (ZOFRAN) injection 4 mg (4 mg Intravenous Given 10/15/23 1511)    ED Course/ Medical Decision Making/ A&P                               Medical Decision Making Amount and/or Complexity of Data Reviewed Labs: ordered. Decision-making details documented in ED Course. Radiology: ordered.  Risk OTC drugs. Prescription drug management.   42 year old female presents with epigastric and left upper quadrant abdominal pain associated with some vomiting and diarrhea.  On arrival patient's vitals are stable and she is in no acute distress.  High clinical  suspicion for gastritis, gastroenteritis or peptic ulcer disease.  Patient had recent MI, troponin and EKG done yesterday were normal and troponins negative today as well.  Hemoglobin stable, no electrolyte derangements, normal renal and liver function and normal lipase.  Acute abdominal x-ray pending as well as symptomatic treatment.  Ordered IV Pepcid, Protonix, GI cocktail and Zofran  Care signed out to [facility] pending imaging and reassessment after symptomatic treatment, she will follow-up outpatient and disposition appropriately.        Final Clinical Impression(s) / ED Diagnoses Final diagnoses:  Epigastric abdominal pain    Rx / DC Orders ED Discharge Orders     None         Rosezella Rumpf, PA-C 10/15/23 1539    Franne Forts, DO 10/16/23 [phone]

## 2023-10-15 NOTE — ED Notes (Signed)
Emesis/ heaves after carafate and tums

## 2023-11-14 ENCOUNTER — Other Ambulatory Visit (HOSPITAL_COMMUNITY): Payer: Self-pay

## 2023-11-16 ENCOUNTER — Other Ambulatory Visit (HOSPITAL_COMMUNITY): Payer: Self-pay

## 2023-11-20 ENCOUNTER — Other Ambulatory Visit (HOSPITAL_COMMUNITY): Payer: Self-pay

## 2023-12-19 ENCOUNTER — Ambulatory Visit (HOSPITAL_COMMUNITY)
Admission: EM | Admit: 2023-12-19 | Discharge: 2023-12-19 | Disposition: A | Discharge status: Home / Self Care | Payer: Medicaid Other | Attending: Behavioral Health | Admitting: Behavioral Health

## 2023-12-19 DIAGNOSIS — F333 Major depressive disorder, recurrent, severe with psychotic symptoms: Secondary | ICD-10-CM | POA: Insufficient documentation

## 2023-12-19 DIAGNOSIS — F332 Major depressive disorder, recurrent severe without psychotic features: Secondary | ICD-10-CM

## 2023-12-19 DIAGNOSIS — T43216A Underdosing of selective serotonin and norepinephrine reuptake inhibitors, initial encounter: Secondary | ICD-10-CM | POA: Insufficient documentation

## 2023-12-19 DIAGNOSIS — F1220 Cannabis dependence, uncomplicated: Secondary | ICD-10-CM | POA: Insufficient documentation

## 2023-12-19 DIAGNOSIS — Z91138 Patient's unintentional underdosing of medication regimen for other reason: Secondary | ICD-10-CM | POA: Diagnosis not present

## 2023-12-19 DIAGNOSIS — F411 Generalized anxiety disorder: Secondary | ICD-10-CM | POA: Insufficient documentation
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
## 2023-12-19 NOTE — ED Provider Notes (Addendum)
 Behavioral Health Urgent Care Medical Screening Exam  Patient Name: Alicia Rosario MRN: [id number] Date of Evaluation: 12/19/23 Chief Complaint:  "I am about to run out of my medication, I have 20 days of medication." Diagnosis: Major depressive disorder recurrent with psychosis Final diagnoses: Major depressive disorder and with current without psychosis. Marijuana dependence Generalized anxiety disorder  History of Present illness: Alicia Rosario is a 43 y.o. AA female with prior psychiatric diagnoses significant for MDD recurrent without psychotic features and generalized anxiety disorder.  Patient presents voluntarily unaccompanied to [facility] behavioral health urgent care reporting she is about to run out of her Effexor SR 225 mg p.o. daily for depression in 20 days.  Patient requesting help for refill.  Patient seen face-to-face sitting up in a chair in the assessment room.  Chart reviewed and plan patient became treatment consult psychiatry.  Patient repeated the same information as per chart review in 2022, see below.  Patient review, in 2022: Patient presented to [facility] with with similar requests. Alicia Rosario is a 43 y.o. female. She ran out of her effexor several days ago and is experiencing nausea and night terrors. She has had similar symptoms in the past when she has abruptly run out of her medicine.  She has been on Effexor for a years.  She is having difficulty finding a psychiatrist to prescribe it for her.  She reports her current PCP is uncomfortable prescribing it.  She is frustrated that she is not able to get it on a consistent basis and initially requests a prescription to taper off of it.  After lengthy discussion with the patient about her depressive symptoms, the reason she is taking Effexor and how it works for her, patient changes her request to getting her Effexor refilled until she can get a new primary care provider and/or  psychiatrist who will prescribe it for her.  She reports she is hoping to have a new PCP appointment within the next 2 weeks.  Without Effexor, she reports crying spells and depressed mood.  1 reason she was initially placed on Effexor is that she would cut herself to relieve symptoms.  She has not cut herself in years.  She denies suicidal ideation or homicidal ideation.  Objective: Patient presents alert, calm, and oriented to person, place, time, and situation.  Mood appears sad, labile and depressed with congruent affect.  Speech clear and coherent with normal volume and pattern. Able to maintained good eye contact with this provider.  Thought process logical, coherent, & organized.  Thought content delusional thinking, paranoia.  Objectively not responding to internal, external stimuli.  She denies SI/HI/AVH.  Vital signs reviewed without critical.  Patient reports that she is here for referral for therapy to check if her medication will be filled.  Patient was referred to the [facility] which is opened from Monday through Friday, from 7 AM to 1 PM daily.  Outpatient therapy resources provided to the patient and she left the facility in stable condition.  Flowsheet Row ED from 10/15/2023 in [facility] Emergency Department at [facility] ED from 10/14/2023 in [facility] Emergency Department at [facility] ED to Hosp-Admission (Discharged) from 08/23/2023 in [facility]  C-SSRS RISK CATEGORY No Risk No Risk No Risk      Psychiatric Specialty Exam  Presentation  General Appearance:Appropriate for Environment; Casual  Eye Contact:Good  Speech:Clear and Coherent  Speech Volume:Normal  Handedness:Right  Mood and Affect  Mood: Anxious; Depressed  Affect: Appropriate; Congruent  Thought Process  Thought Processes: Coherent  Descriptions of Associations:Intact  Orientation:Full (Time, Place and Person)  Thought Content:Logical     Hallucinations:None  Ideas of Reference:None  Suicidal Thoughts:No  Homicidal Thoughts:No  Sensorium  Memory: Immediate Good; Recent Good  Judgment: Fair  Insight: Fair  Art therapist  Concentration: Fair  Attention Span: Fair  Recall: Fair  Fund of Knowledge: Good  Language: Good  Psychomotor Activity  Psychomotor Activity: Normal  Assets  Assets: Communication Skills; Desire for Improvement; Housing; Physical Health; Resilience  Sleep  Sleep: Good  Number of hours:  10  Physical Exam: Physical Exam Vitals and nursing note reviewed.  Constitutional:      Appearance: She is obese.  HENT:     Head: Normocephalic.     Nose: Nose normal.     Mouth/Throat:     Mouth: Mucous membranes are moist.     Pharynx: Oropharynx is clear.  Eyes:     Extraocular Movements: Extraocular movements intact.  Cardiovascular:     Rate and Rhythm: Normal rate.     Pulses: Normal pulses.  Pulmonary:     Effort: Pulmonary effort is normal.  Abdominal:     Comments: Deferred  Genitourinary:    Comments: Deferred Musculoskeletal:        General: Normal range of motion.     Cervical back: Normal range of motion.  Skin:    General: Skin is warm.  Neurological:     General: No focal deficit present.     Mental Status: She is alert and oriented to person, place, and time.  Psychiatric:        Mood and Affect: Mood normal.        Behavior: Behavior normal.        Thought Content: Thought content normal.    Review of Systems  Constitutional:  Negative for chills and fever.  HENT:  Negative for hearing loss and sore throat.   Eyes:  Negative for blurred vision.  Respiratory:  Negative for cough, sputum production, shortness of breath and wheezing.   Cardiovascular:  Negative for chest pain and palpitations.  Gastrointestinal:  Negative for abdominal pain, constipation, diarrhea, heartburn, nausea and vomiting.  Genitourinary:  Negative for dysuria,  frequency and urgency.  Musculoskeletal:  Negative for back pain, falls, joint pain, myalgias and neck pain.  Skin:  Negative for itching and rash.  Neurological:  Negative for dizziness and headaches.  Endo/Heme/Allergies:        See allergy listing  Psychiatric/Behavioral:  Positive for depression and substance abuse. Negative for hallucinations and suicidal ideas. The patient is nervous/anxious. The patient does not have insomnia.    Blood pressure (!) 135/91, pulse 87, temperature 98.6 F (37 C), temperature source Oral, resp. rate 18. There is no height or weight on file to calculate BMI.  Musculoskeletal: Strength & Muscle Tone: within normal limits Gait & Station: normal Patient leans: N/A  [facility] MSE Discharge Disposition for Follow up outpatient provide and therapy resources as noted below  Adult Mental Health Resources  [facility] Address: [address], [place], [place] [address] Phone: [phone]  [facility], [facility] Address: [address] [place], [place] [address] Hours:  Closed ? Opens 8?AM Mon Phone: [phone]  [facility] Address: [address], [place], [place] [address] Phone: [phone]  [facility], [facility] Address: [phone]  [address] [place], [place] [address] Phone: [phone]  [facility] Address: [place] [facility], [address] [place], [place] [address] Hours:  Closed ? Opens 8?AM Mon Phone: [phone]  [facility] Address: [address] [place], [place], [place] [address] Phone: [phone] INTAKE: [phone] Ext [phone] Counseling Group Address: [address], [place], [place] [address] Phone: [phone]  [facility] Address: [address], [place], [place] [address] Phone: [phone]  [facility] Address: [address], [place], [place]  [address] Phone: [phone]  [facility] Address: [address] [place], [place] [address] Phone: [phone]  [facility] Phone: [phone]   [facility]  Address: [address] [place], [place] [address] Hours: Open 24 hours Phone: [phone]  Cecilie Lowers, FNP 12/19/2023, 1:03 PM

## 2023-12-19 NOTE — Discharge Instructions (Signed)
.  Adult Mental Health Resources  [facility] Address: [address] [place], [place] [address] Phone: [phone]  [facility], [facility] Address: [address] [place], [place] [address] Hours:  Closed ? Opens 8?AM Mon Phone: [phone]  [facility] Address: [address], [place], [place] [address] Phone: [phone]  [facility], [facility] Address: [address], [place], [place] [address] Phone: [phone]  [facility] Address: [place] [facility], [address], [place], [place] [address] Hours:  Closed ? Opens 8?AM Mon Phone: [phone]  [facility] Address: [address] [place], [place], [place] [address] Phone: [phone] INTAKE: [phone] Ext [phone] Counseling Group Address: [address] [place], [place] [address] Phone: [phone]  [facility] Address: [address], [place], [place] [address] Phone: [phone]  [facility] Address: [address], [place], [place] [address] Phone: [phone]  [facility] Address: [address] [place], [place] [address] Phone: [phone]  [facility] Phone: [phone]   [facility]  Address: [address], [place] [address] Hours: Open 24 hours Phone: [phone]

## 2024-01-11 ENCOUNTER — Encounter: Payer: Self-pay | Admitting: Cardiology

## 2024-01-11 ENCOUNTER — Ambulatory Visit: Payer: Medicaid Other | Attending: Cardiology | Admitting: Cardiology

## 2024-01-11 VITALS — BP 130/88 | HR 72 | Ht 64.0 in | Wt 218.0 lb

## 2024-01-11 DIAGNOSIS — I10 Essential (primary) hypertension: Secondary | ICD-10-CM

## 2024-01-11 DIAGNOSIS — I25119 Atherosclerotic heart disease of native coronary artery with unspecified angina pectoris: Secondary | ICD-10-CM

## 2024-01-11 DIAGNOSIS — E785 Hyperlipidemia, unspecified: Secondary | ICD-10-CM

## 2024-01-11 DIAGNOSIS — K219 Gastro-esophageal reflux disease without esophagitis: Secondary | ICD-10-CM

## 2024-01-11 DIAGNOSIS — Z955 Presence of coronary angioplasty implant and graft: Secondary | ICD-10-CM

## 2024-01-11 DIAGNOSIS — R600 Localized edema: Secondary | ICD-10-CM

## 2024-01-11 DIAGNOSIS — I2119 ST elevation (STEMI) myocardial infarction involving other coronary artery of inferior wall: Secondary | ICD-10-CM

## 2024-01-11 DIAGNOSIS — F411 Generalized anxiety disorder: Secondary | ICD-10-CM

## 2024-01-11 MED ORDER — METOPROLOL SUCCINATE ER 50 MG PO TB24: 50.0000 mg | ORAL_TABLET | Freq: Every day | ORAL | 3 refills | Status: DC

## 2024-01-11 NOTE — Patient Instructions (Addendum)
 Medication Instructions:  Stop taking Aspirin  on April 31 , 2025 Continue with Brilinta   Increase  Metoprolol Succinate  ( Toprol) 50 mg  daily  *If you need a refill on your cardiac medications before your next appointment, please call your pharmacy*   Lab Work:fasting CBC CMP LIPID [id number] If you have labs (blood work) drawn today and your tests are completely normal, you will receive your results only by: MyChart Message (if you have MyChart) OR A paper copy in the mail If you have any lab test that is abnormal or we need to change your treatment, we will call you to review the results.   Testing/Procedures:  Not needed  Follow-Up: At [facility], you and your health needs are our priority.  As part of our continuing mission to provide you with exceptional heart care, we have created designated Provider Care Teams.  These Care Teams include your primary Cardiologist (physician) and Advanced Practice Providers (APPs -  Physician Assistants and Nurse Practitioners) who all work together to provide you with the care you need, when you need it.     Your next appointment:   7 month(s)  The format for your next appointment:   In Person  Provider:   Bryan Lemma, MD    Other Instructions

## 2024-01-11 NOTE — Progress Notes (Unsigned)
 Cardiology Office Note:  .   Date:  01/12/2024  ID:  Alicia Rosario, DOB October 05, 1981, MRN [id number] PCP: [facility] Providers Cardiologist:  Bryan Lemma, MD     Chief Complaint  Patient presents with   Follow-up    46-month follow-up   Coronary Artery Disease    Inferior STEMI in October 2024    Patient Profile: .     Alicia Rosario is a moderate to severely obese 43 y.o. female with a PMH notable for CAD-inferior STEMI with LCx PCI, HTN and HLD who presents here for 2-month follow-up at the request of Shon Baton, PA-C.  Inferior STEMI 08/23/2023: Distal LCx 100% (DES PCI), 40% mid RCA.   Alicia Rosario was last seen on October 13, 2023 by Joni Reining, NP for 1 month follow-up having been seen in November by Robet Leu, PA (noting some shoulder pain with reduced range of motion).  Similar sensations in December.  Noted that her heart rate was a lot higher than usual.  Increase stress (works as a Associate Professor at [employer]).  On her feet all day.  Noting intermittent episodes of increased heart rate and blood pressure with stress.  Continued on aspirin and Brilinta.  Toprol dose increased to 37.5 mg daily. Plan was to recheck lipids.  Subjective  Discussed the use of AI scribe software for clinical note transcription with the patient, who gave verbal consent to proceed.  History of Present Illness   Alicia Rosario is a 43 year old female with coronary artery disease who presents for follow-up after a heart attack.  She was hospitalized in October for a myocardial infarction and underwent stent placement for a 99% coronary artery blockage. She is now approximately six months post-stent placement and has been on dual antiplatelet therapy with aspirin and Brilinta. She experiences bruising, which she attributes to her medication regimen, and asked if she could discontinue aspirin sooner due to these concerns.  She experiences episodes of  tachycardia, which were noted during her recent hospital stay, leading to an increase in her metoprolol dose to 37.5 mg. No irregular heartbeats, palpitations, or paroxysmal nocturnal dyspnea. Her heart rate feels fast only when she measures her blood pressure.  In December, she experienced chest pain initially thought to be related to a pneumothorax but later attributed to gastrointestinal inflammation due to acid reflux and medication use. She was treated with sucralfate and Protonix, which she continues to take despite concerns about long-term use. No stroke-like symptoms or significant dizziness.  She experiences peripheral edema, particularly after prolonged standing at work, but the swelling goes down at night & is just a little tight around the socks. 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 She has three children aged 57, 42, and 48. She is experiencing significant stress at home, potentially related to marital issues (potential divorce) , and lacks close family support nearby.     Cardiovascular ROS: no chest pain or dyspnea on exertion positive for - edema and somewhat easy fatigue.  Gets short of breath walking up stairs negative for - irregular heartbeat, orthopnea, palpitations, paroxysmal nocturnal dyspnea, rapid heart rate, shortness of breath, or lightheadedness, dizziness, wooziness or syncope/near syncope, TIA/amaurosis fugax, claudication  ROS:  Review of Systems - Negative except symptoms noted in HPI (and reviewed in Social History)    Objective   Social History She works in a pharmacy - [employer] & is on her feet for long  hours. She has three children aged 24, 60, and 9. She is experiencing significant stress at home, potentially related to marital issues (potential divorce) , and lacks close family support nearby.  Medications CAD: BP: - Metoprolol Succinate (Toprol) 25 mg tab --> 1.5 tab (37.5 mg) daily; - Losartan 25 mg daily- PRN NTG - has not  used. DAPT: - Aspirin 81 mg daily; - Brilinta 90 mg BID - Protonix 40 mg daily - Carafate 1 g qachs  PRN - Effexor 75 mg  tab - 3 tab (225 mg) daily   Studies Reviewed: Marland Kitchen        CATH-PCI (08/23/2023): Inferior STEMI- dLCx 99% (Onyx frontier DES 2.5 x 18 mm postdilated to 2.6 mm.) mid RCA 45%.  Normal EF. Diagnostic: Dominance: Co-dominant     Intervention  ECHO 08/16/2023: (Post inferior STEMI) EF 50 to 55%.  "No "RWMA.  Normal valves.  Normal pressures.   Risk Assessment/Calculations:          Physical Exam:   VS:  BP 130/88   Pulse 72   Ht 5\' 4"  (1.626 m)   Wt 218 lb (98.9 kg)   SpO2 98%   BMI 37.42 kg/m    Wt Readings from Last 3 Encounters:  01/11/24 218 lb (98.9 kg)  10/13/23 221 lb 9.6 oz (100.5 kg)  09/02/23 221 lb (100.2 kg)    Physical Exam GENERAL: Alert, cooperative, well developed, no acute distress, normal appearance HEENT: Normocephalic, normal oropharynx, moist mucous membranes CHEST: Clear to auscultation bilaterally, no wheezes, rhonchi, or crackles CARDIOVASCULAR: Normal heart rate and rhythm, S1 and S2 normal without murmurs ABDOMEN: Soft, non-tender, non-distended, without organomegaly, normal bowel sounds EXTREMITIES: No cyanosis or edema NEUROLOGICAL: Cranial nerves grossly intact, moves all extremities without gross motor or sensory deficit    ASSESSMENT AND PLAN: .    Problem List Items Addressed This Visit       Cardiology Problems   Coronary artery disease involving native coronary artery of native heart with angina pectoris (HCC) (Chronic)   Six months post-stent placement.  No further anginal symptoms since PCI. Severe bruising likely from aspirin. Stent functioning well. Echocardiogram shows good heart function. - Discontinue aspirin at the end of April. - Continue Brilinta at treatment dose of 90 mg twice daily to complete 1 year at which time we can convert to Plavix 75 mg daily to complete here to 2.       Relevant Medications    metoprolol succinate (TOPROL-XL) 50 MG 24 hr tablet   Other Relevant Orders   Lipid panel   Comprehensive metabolic panel   Hemoglobin A1c   CBC   Essential (primary) hypertension (Chronic)   Blood pressure 130/88 mmHg.  Lightheadedness likely from medication.  Metoprolol preferred for heart rate control. - Increase metoprolol succinate to 50 mg because of issues with tachycardia. -Continue losartan 25 mg daily-this would be the next medication to titrate      Relevant Medications   metoprolol succinate (TOPROL-XL) 50 MG 24 hr tablet   Other Relevant Orders   Lipid panel   Comprehensive metabolic panel   Hemoglobin A1c   CBC   H/o STEMI involving oth coronary artery of inferior wall (HCC) (Chronic)   PCI during the day of August 23, 2023-distal circumflex PCI.  Has done relatively well since then.  Preserved low normal EF of 50 to 55%.  There was no regional wall motion abnormalities suggesting that this may have been her baseline EF.  She is newly  ready for cardiac rehab referral.  Is been sometime since her MI so we need to make sure that it not too far out from the MI for referral.      Relevant Medications   metoprolol succinate (TOPROL-XL) 50 MG 24 hr tablet   Other Relevant Orders   Lipid panel   Comprehensive metabolic panel   Hemoglobin A1c   CBC   Hyperlipidemia with target low density lipoprotein (LDL) cholesterol less than 55 mg/dL (Chronic)   On cholesterol medication. Need to assess efficacy and liver impact. - Order lipid panel. - Order liver function tests. -Continue to atorvastatin 80 mg for now.      Relevant Medications   metoprolol succinate (TOPROL-XL) 50 MG 24 hr tablet   Other Relevant Orders   Lipid panel   Comprehensive metabolic panel   Hemoglobin A1c   CBC     Other   GAD (generalized anxiety disorder) (Chronic)   Seems to be doing okay.  On high-dose Effexor.      GERD (gastroesophageal reflux disease) (Chronic)   GERD exacerbated  by aspirin, causing intestinal inflammation. Managed with Protonix, and PRN Carafate. - Continue Protonix. - Carafate for GI/PCP - Discontinue aspirin at the end of April.      Mild peripheral edema (Chronic)   Trivial leg swelling likely from losartan with prolonged standing..  Advised support socks for prolonged standing. - Advise use of support socks.      Presence of drug coated stent in left circumflex coronary artery - Primary (Chronic)   Significant bruising with aspirin and Brilinta.  She is now 6 months out.  I think were safe going with Brilinta SAPT to complete the full 6 months.  She will stop aspirin at the end of April.  Continue Brilinta 90 mg twice daily through October 2025 at which time we will convert to Plavix monotherapy for additional 1 year of SAPT -which can be interrupted.      Relevant Orders   Lipid panel   Comprehensive metabolic panel   Hemoglobin A1c   CBC    Recording duration: 15 minutes      I think Alicia Rosario would be a great candidate for cardiac rehab.  She does not have unstable angina and her blood pressure is controlled as is her sugars.  She is ambulatory.   The patient has been fully active for some time, but I think from a psychological and lifestyle modification standpoint this may help her some.  The biggest issue will be getting off of work.        Follow-Up: Return in about 7 months (around 08/12/2024) for Routine follow up with me.     Signed, Marykay Lex, MD, MS Bryan Lemma, M.D., M.S. Interventional Cardiologist  [facility]  Pager # [phone] Phone # [phone] [address] [place], [place] [address]

## 2024-01-12 ENCOUNTER — Encounter: Payer: Self-pay | Admitting: Cardiology

## 2024-01-12 DIAGNOSIS — K219 Gastro-esophageal reflux disease without esophagitis: Secondary | ICD-10-CM | POA: Insufficient documentation

## 2024-01-12 DIAGNOSIS — R600 Localized edema: Secondary | ICD-10-CM | POA: Insufficient documentation

## 2024-01-12 NOTE — Assessment & Plan Note (Signed)
 PCI during the day of August 23, 2023-distal circumflex PCI.  Has done relatively well since then.  Preserved low normal EF of 50 to 55%.  There was no regional wall motion abnormalities suggesting that this may have been her baseline EF.  She is newly ready for cardiac rehab referral.  Is been sometime since her MI so we need to make sure that it not too far out from the MI for referral.

## 2024-01-12 NOTE — Progress Notes (Signed)
 I think Alicia Rosario would be a great candidate for cardiac rehab.  She does not have unstable angina and her blood pressure is controlled as is her sugars.  She is ambulatory.  The patient has been fully active for some time, but I think from a psychological and lifestyle modification standpoint this may help her some.  The biggest issue will be getting off of work.  Bryan Lemma, MD

## 2024-01-12 NOTE — Assessment & Plan Note (Signed)
 Trivial leg swelling likely from losartan with prolonged standing..  Advised support socks for prolonged standing. - Advise use of support socks.

## 2024-01-12 NOTE — Assessment & Plan Note (Signed)
 Blood pressure 130/88 mmHg.  Lightheadedness likely from medication.  Metoprolol preferred for heart rate control. - Increase metoprolol succinate to 50 mg because of issues with tachycardia. -Continue losartan 25 mg daily-this would be the next medication to titrate

## 2024-01-12 NOTE — Assessment & Plan Note (Signed)
 On cholesterol medication. Need to assess efficacy and liver impact. - Order lipid panel. - Order liver function tests. -Continue to atorvastatin 80 mg for now.

## 2024-01-12 NOTE — Assessment & Plan Note (Signed)
 Seems to be doing okay.  On high-dose Effexor.

## 2024-01-12 NOTE — Assessment & Plan Note (Signed)
 Six months post-stent placement.  No further anginal symptoms since PCI. Severe bruising likely from aspirin. Stent functioning well. Echocardiogram shows good heart function. - Discontinue aspirin at the end of April. - Continue Brilinta at treatment dose of 90 mg twice daily to complete 1 year at which time we can convert to Plavix 75 mg daily to complete here to 2.

## 2024-01-12 NOTE — Assessment & Plan Note (Signed)
 GERD exacerbated by aspirin, causing intestinal inflammation. Managed with Protonix, and PRN Carafate. - Continue Protonix. - Carafate for GI/PCP - Discontinue aspirin at the end of April.

## 2024-01-12 NOTE — Assessment & Plan Note (Signed)
 Significant bruising with aspirin and Brilinta.  She is now 6 months out.  I think were safe going with Brilinta SAPT to complete the full 6 months.  She will stop aspirin at the end of April.  Continue Brilinta 90 mg twice daily through October 2025 at which time we will convert to Plavix monotherapy for additional 1 year of SAPT -which can be interrupted.

## 2024-01-21 ENCOUNTER — Ambulatory Visit (INDEPENDENT_AMBULATORY_CARE_PROVIDER_SITE_OTHER): Admitting: Student

## 2024-01-21 DIAGNOSIS — F332 Major depressive disorder, recurrent severe without psychotic features: Secondary | ICD-10-CM | POA: Diagnosis not present

## 2024-01-21 DIAGNOSIS — F411 Generalized anxiety disorder: Secondary | ICD-10-CM | POA: Diagnosis not present

## 2024-01-21 DIAGNOSIS — R0683 Snoring: Secondary | ICD-10-CM

## 2024-01-21 DIAGNOSIS — I10 Essential (primary) hypertension: Secondary | ICD-10-CM

## 2024-01-21 DIAGNOSIS — F4310 Post-traumatic stress disorder, unspecified: Secondary | ICD-10-CM

## 2024-01-21 DIAGNOSIS — I2119 ST elevation (STEMI) myocardial infarction involving other coronary artery of inferior wall: Secondary | ICD-10-CM

## 2024-01-21 DIAGNOSIS — R400 Somnolence: Secondary | ICD-10-CM

## 2024-01-21 MED ORDER — LAMOTRIGINE 25 MG PO TABS: ORAL_TABLET | ORAL | 0 refills | Status: DC

## 2024-01-21 MED ORDER — VENLAFAXINE HCL ER 75 MG PO CP24: 225.0000 mg | ORAL_CAPSULE | Freq: Every day | ORAL | 1 refills | Status: DC

## 2024-01-21 MED ORDER — CLONIDINE HCL 0.1 MG PO TABS: 0.1000 mg | ORAL_TABLET | Freq: Every day | ORAL | 1 refills | Status: DC
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
## 2024-01-22 ENCOUNTER — Encounter (HOSPITAL_COMMUNITY): Payer: Self-pay | Admitting: Student

## 2024-01-22 DIAGNOSIS — R400 Somnolence: Secondary | ICD-10-CM | POA: Insufficient documentation

## 2024-01-22 DIAGNOSIS — F4310 Post-traumatic stress disorder, unspecified: Secondary | ICD-10-CM | POA: Insufficient documentation

## 2024-01-22 DIAGNOSIS — R0683 Snoring: Secondary | ICD-10-CM | POA: Insufficient documentation

## 2024-01-24 ENCOUNTER — Encounter (HOSPITAL_COMMUNITY): Payer: Self-pay | Admitting: Student

## 2024-02-08 ENCOUNTER — Other Ambulatory Visit (INDEPENDENT_AMBULATORY_CARE_PROVIDER_SITE_OTHER)

## 2024-02-08 DIAGNOSIS — F332 Major depressive disorder, recurrent severe without psychotic features: Secondary | ICD-10-CM

## 2024-02-08 DIAGNOSIS — Z79899 Other long term (current) drug therapy: Secondary | ICD-10-CM | POA: Diagnosis not present

## 2024-02-08 DIAGNOSIS — I10 Essential (primary) hypertension: Secondary | ICD-10-CM

## 2024-02-08 DIAGNOSIS — F4310 Post-traumatic stress disorder, unspecified: Secondary | ICD-10-CM

## 2024-02-08 DIAGNOSIS — F411 Generalized anxiety disorder: Secondary | ICD-10-CM

## 2024-02-08 DIAGNOSIS — R0683 Snoring: Secondary | ICD-10-CM

## 2024-02-08 DIAGNOSIS — I2119 ST elevation (STEMI) myocardial infarction involving other coronary artery of inferior wall: Secondary | ICD-10-CM

## 2024-02-08 DIAGNOSIS — R400 Somnolence: Secondary | ICD-10-CM

## 2024-02-08 NOTE — Progress Notes (Signed)
 Pt tolerated labs well in left arm with no complaints.    JNL, CMA

## 2024-02-09 ENCOUNTER — Encounter (HOSPITAL_COMMUNITY): Payer: Self-pay | Admitting: Student

## 2024-02-09 LAB — T4 AND TSH
T4, Total: 7.9 ug/dL (ref 4.5–12.0)
TSH: 1.74 u[IU]/mL (ref 0.450–4.500)

## 2024-02-09 LAB — VITAMIN D 25 HYDROXY (VIT D DEFICIENCY, FRACTURES): Vit D, 25-Hydroxy: 12.2 ng/mL — ABNORMAL LOW (ref 30.0–100.0)

## 2024-02-09 LAB — B12 AND FOLATE PANEL
Folate: 7.4 ng/mL (ref >3.0)
Vitamin B-12: 600 pg/mL (ref 232–1245)

## 2024-02-11 NOTE — Addendum Note (Signed)
 Addended by: Ulysess Gang A on: 02/11/2024 03:09 PM   Modules accepted: Level of Service

## 2024-02-19 ENCOUNTER — Ambulatory Visit (HOSPITAL_COMMUNITY): Admitting: Student

## 2024-02-19 ENCOUNTER — Encounter (HOSPITAL_COMMUNITY): Payer: Self-pay | Admitting: Student

## 2024-02-19 VITALS — BP 137/100 | HR 78 | Ht 64.5 in | Wt 218.8 lb

## 2024-02-19 DIAGNOSIS — F332 Major depressive disorder, recurrent severe without psychotic features: Secondary | ICD-10-CM

## 2024-02-19 DIAGNOSIS — F4310 Post-traumatic stress disorder, unspecified: Secondary | ICD-10-CM

## 2024-02-19 DIAGNOSIS — R400 Somnolence: Secondary | ICD-10-CM

## 2024-02-19 DIAGNOSIS — F411 Generalized anxiety disorder: Secondary | ICD-10-CM

## 2024-02-19 DIAGNOSIS — R0683 Snoring: Secondary | ICD-10-CM

## 2024-02-19 MED ORDER — CLONIDINE HCL 0.1 MG PO TABS: 0.1000 mg | ORAL_TABLET | Freq: Every evening | ORAL | 1 refills | Status: DC

## 2024-02-19 MED ORDER — VENLAFAXINE HCL ER 75 MG PO CP24: 225.0000 mg | ORAL_CAPSULE | Freq: Every day | ORAL | 1 refills | Status: DC

## 2024-02-19 MED ORDER — LAMOTRIGINE 100 MG PO TABS: 100.0000 mg | ORAL_TABLET | Freq: Every evening | ORAL | 1 refills | Status: DC
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
## 2024-02-25 ENCOUNTER — Ambulatory Visit (HOSPITAL_COMMUNITY): Payer: MEDICAID | Admitting: Student

## 2024-02-29 ENCOUNTER — Institutional Professional Consult (permissible substitution): Admitting: Neurology

## 2024-03-09 ENCOUNTER — Telehealth (HOSPITAL_COMMUNITY): Payer: Self-pay

## 2024-03-09 DIAGNOSIS — F332 Major depressive disorder, recurrent severe without psychotic features: Secondary | ICD-10-CM

## 2024-03-09 DIAGNOSIS — F4310 Post-traumatic stress disorder, unspecified: Secondary | ICD-10-CM

## 2024-03-09 DIAGNOSIS — R400 Somnolence: Secondary | ICD-10-CM

## 2024-03-09 DIAGNOSIS — F411 Generalized anxiety disorder: Secondary | ICD-10-CM

## 2024-03-09 DIAGNOSIS — R0683 Snoring: Secondary | ICD-10-CM

## 2024-03-09 NOTE — Telephone Encounter (Signed)
 Hello,   Pt is requesting for a Refill of Lamotrigine  25mg  to be sent to on file pharmacy.

## 2024-03-10 MED ORDER — LAMOTRIGINE 100 MG PO TABS
100.0000 mg | ORAL_TABLET | Freq: Every evening | ORAL | 1 refills | Status: DC
Start: 2024-03-10 — End: 2024-03-24

## 2024-03-10 MED ORDER — CLONIDINE HCL 0.1 MG PO TABS: 0.1000 mg | ORAL_TABLET | Freq: Every evening | ORAL | 1 refills | Status: DC

## 2024-03-10 MED ORDER — VENLAFAXINE HCL ER 75 MG PO CP24: 225.0000 mg | ORAL_CAPSULE | Freq: Every day | ORAL | 1 refills | Status: DC

## 2024-03-10 NOTE — Addendum Note (Signed)
 Addended by: Ercel Pepitone on: 03/10/2024 04:38 PM   Modules accepted: Orders

## 2024-03-15 ENCOUNTER — Encounter: Payer: Self-pay | Admitting: Neurology

## 2024-03-15 ENCOUNTER — Ambulatory Visit (INDEPENDENT_AMBULATORY_CARE_PROVIDER_SITE_OTHER): Admitting: Neurology

## 2024-03-15 VITALS — BP 154/93 | HR 76 | Ht 64.0 in | Wt 218.0 lb

## 2024-03-15 DIAGNOSIS — Z9189 Other specified personal risk factors, not elsewhere classified: Secondary | ICD-10-CM | POA: Diagnosis not present

## 2024-03-15 DIAGNOSIS — R0683 Snoring: Secondary | ICD-10-CM

## 2024-03-15 DIAGNOSIS — G4719 Other hypersomnia: Secondary | ICD-10-CM | POA: Diagnosis not present

## 2024-03-15 DIAGNOSIS — E669 Obesity, unspecified: Secondary | ICD-10-CM | POA: Diagnosis not present

## 2024-03-15 DIAGNOSIS — Z955 Presence of coronary angioplasty implant and graft: Secondary | ICD-10-CM

## 2024-03-15 DIAGNOSIS — R351 Nocturia: Secondary | ICD-10-CM

## 2024-03-15 DIAGNOSIS — R519 Headache, unspecified: Secondary | ICD-10-CM

## 2024-03-15 DIAGNOSIS — R030 Elevated blood-pressure reading, without diagnosis of hypertension: Secondary | ICD-10-CM

## 2024-03-15 DIAGNOSIS — I252 Old myocardial infarction: Secondary | ICD-10-CM

## 2024-03-15 NOTE — Progress Notes (Signed)
 Subjective:    Patient ID: Alicia Rosario is a 43 y.o. female.  HPI    Alicia Fairy, MD, PhD [name] [address], [address] [address] [place], [place] [address]  Dear Dr. Eloy Half,   I saw your patient, Alicia Rosario, upon your kind request in my sleep clinic today for initial consultation of her sleep disorder, in particular, concern for underlying obstructive sleep apnea.  The patient is unaccompanied today.  As you know, [name] is a 43 year old female with an underlying medical history of coronary artery disease with history of STEMI, status post stent placement, mood disorder, including depression, anxiety, history of irritable bowel syndrome, hypertension, hyperlipidemia, AUD, and obesity, who reports snoring and excessive daytime somnolence.  She reports difficulty maintaining sleep.  Her Epworth sleepiness score is 15 out of 24, fatigue severity score is 53 out of 63.  I reviewed your office note from 01/21/2024.  She is not aware of any family history of sleep apnea.  She has significant nocturia about 3-4 times per average night.  Her weight has plateaued.  She has trouble losing weight.  She has woken up with a sense of gasping for air.  She works as a Associate Professor.  She reports stress and currently is going through a separation.  She lives with her significant other.  She also lives with her 36-year-old daughter and a 26 year old Museum/gallery conservator.  In addition, she has a 90 year old daughter and a 66 year old who lives with their grandmother.  She has a TV in her bedroom but likes her bedroom to be cool dark and quiet typically.  Bedtime is around 11 and rise time is around 6.  She has variable work hours and may work as late as 10 PM.  She quit smoking in 2024 after her heart attack.  She does not vape or use any THC products.  She has had occasional morning headaches.  They have no pets in the household.  She drinks alcohol occasionally and admits that she has  had heavy alcohol use in the past.  Her Past Medical History Is Significant For: Past Medical History:  Diagnosis Date   Bipolar II disorder (HCC)    Depression    Hypertension    Irritable bowel syndrome (IBS)     Her Past Surgical History Is Significant For: Past Surgical History:  Procedure Laterality Date   CESAREAN SECTION N/A 02/17/2017   Procedure: CESAREAN SECTION;  Surgeon: Tresia Fruit, MD;  Location: [facility] BIRTHING SUITES;  Service: Obstetrics;  Laterality: N/A;   CHOLECYSTECTOMY     CORONARY/GRAFT ACUTE MI REVASCULARIZATION N/A 08/23/2023   Procedure: Coronary/Graft Acute MI Revascularization;  Surgeon: Arleen Lacer, MD;  Location: [facility] INVASIVE CV LAB;  Service: Cardiovascular;  Laterality: N/A;   MOUTH SURGERY      Her Family History Is Significant For: Family History  Problem Relation Age of Onset   Mental illness Mother    Hyperlipidemia Father    Cancer Paternal Grandmother        Breast CA   Sleep apnea Neg Hx     Her Social History Is Significant For: Social History   Socioeconomic History   Marital status: Married    Spouse name: Not on file   Number of children: Not on file   Years of education: Not on file   Highest education level: Not on file  Occupational History   Not on file  Tobacco Use   Smoking status: Former  Smokeless tobacco: Never  Vaping Use   Vaping status: Never Used  Substance and Sexual Activity   Alcohol use: Yes    Alcohol/week: 3.0 standard drinks of alcohol    Types: 1 Glasses of wine, 2 Cans of beer per week    Comment: social   Drug use: No   Sexual activity: Yes    Birth control/protection: I.U.D.  Other Topics Concern   Not on file  Social History Narrative   Pt lives with family   Pt works    Social Drivers of Corporate investment banker Strain: Not on file  Food Insecurity: No Food Insecurity (08/23/2023)   Hunger Vital Sign    Worried About Running Out of Food in the Last Year: Never true    Ran Out  of Food in the Last Year: Never true  Transportation Needs: No Transportation Needs (08/23/2023)   PRAPARE - Administrator, Civil Service (Medical): No    Lack of Transportation (Non-Medical): No  Physical Activity: Not on file  Stress: Not on file  Social Connections: Not on file    Her Allergies Are:  No Known Allergies:   Her Current Medications Are:  Outpatient Encounter Medications as of 03/15/2024  Medication Sig   acetaminophen  (TYLENOL ) 500 MG tablet Take 1,000 mg by mouth 2 (two) times daily as needed for moderate pain (pain score 4-6) or headache.   aspirin  EC 81 MG tablet Take 1 tablet (81 mg total) by mouth daily. Swallow whole.   atorvastatin  (LIPITOR) 80 MG tablet Take 1 tablet (80 mg total) by mouth daily.   cloNIDine  (CATAPRES ) 0.1 MG tablet Take 1 tablet (0.1 mg total) by mouth at bedtime.   lamoTRIgine  (LAMICTAL ) 100 MG tablet Take 1 tablet (100 mg total) by mouth at bedtime.   losartan  (COZAAR ) 25 MG tablet Take 25 mg by mouth daily.   metoprolol  succinate (TOPROL -XL) 50 MG 24 hr tablet Take 1 tablet (50 mg total) by mouth daily. Take with or immediately following a meal.   nitroGLYCERIN  (NITROSTAT ) 0.4 MG SL tablet Place 1 tablet (0.4 mg total) under the tongue every 5 (five) minutes as needed.   pantoprazole  (PROTONIX ) 40 MG tablet Take 1 tablet (40 mg total) by mouth daily.   ticagrelor  (BRILINTA ) 90 MG TABS tablet Take 1 tablet (90 mg total) by mouth 2 (two) times daily.   venlafaxine  XR (EFFEXOR -XR) 75 MG 24 hr capsule Take 3 capsules (225 mg total) by mouth daily with breakfast.   No facility-administered encounter medications on file as of 03/15/2024.  :   Review of Systems:  Out of a complete 14 point review of systems, all are reviewed and negative with the exception of these symptoms as listed below:  Review of Systems  Neurological:        Pt here for sleep consult Pt snores,headaches,fatigue,hypertension Pt denies sleep study,cpap machine  Pt states had HeartAttack 2024    ESS:53 FSS:15    Objective:  Neurological Exam  Physical Exam Physical Examination:   Vitals:   03/15/24 1018  BP: (!) 154/93  Pulse: 76    General Examination: The patient is a very pleasant 43 y.o. female in no acute distress. She appears well-developed and well-nourished and well groomed.   HEENT: Normocephalic, atraumatic, pupils are equal, round and reactive to light, extraocular tracking is good without limitation to gaze excursion or nystagmus noted. Hearing is grossly intact. Face is symmetric with normal facial animation. Speech is clear with no  dysarthria noted. There is no hypophonia. There is no lip, neck/head, jaw or voice tremor. Neck is supple with full range of passive and active motion. There are no carotid bruits on auscultation. Oropharynx exam reveals: mild mouth dryness, adequate dental hygiene and mild airway crowding, due to small airway entry, tonsils on the smaller side, uvula slightly elongated, Mallampati class II.  Neck circumference 14-7/8 inches.  Mild overbite noted. Tongue protrudes centrally and palate elevates symmetrically.  Chest: Clear to auscultation without wheezing, rhonchi or crackles noted.  Heart: S1+S2+0, regular and normal without murmurs, rubs or gallops noted.   Abdomen: Soft, non-tender and non-distended.  Extremities: There is no pitting edema in the distal lower extremities bilaterally.   Skin: Warm and dry without trophic changes noted.   Musculoskeletal: exam reveals no obvious joint deformities.   Neurologically:  Mental status: The patient is awake, alert and oriented in all 4 spheres. Her immediate and remote memory, attention, language skills and fund of knowledge are appropriate. There is no evidence of aphasia, agnosia, apraxia or anomia. Speech is clear with normal prosody and enunciation. Thought process is linear. Mood is dysthymic and affect is blunted.  Near tears at times. Cranial  nerves II - XII are as described above under HEENT exam.  Motor exam: Normal bulk, strength and tone is noted. There is no obvious action or resting tremor.  Fine motor skills and coordination: grossly intact.  Cerebellar testing: No dysmetria or intention tremor. There is no truncal or gait ataxia.  Sensory exam: intact to light touch in the upper and lower extremities.  Gait, station and balance: She stands easily. No veering to one side is noted. No leaning to one side is noted. Posture is age-appropriate and stance is narrow based. Gait shows normal stride length and normal pace. No problems turning are noted.   Assessment and Plan:  In summary, CHEVIE BIRKHEAD is a very pleasant 43 y.o.-year old female with an underlying medical history of coronary artery disease with history of STEMI, status post stent placement, mood disorder, including depression, anxiety, history of irritable bowel syndrome, hypertension, hyperlipidemia, and obesity, whose history and physical exam are concerning for sleep disordered breathing, particularly obstructive sleep apnea (OSA). A laboratory attended sleep study is typically considered "gold standard" for evaluation of sleep disordered breathing.   I had a long chat with the patient about my findings and the diagnosis of sleep apnea, particularly OSA, its prognosis and treatment options. We talked about medical/conservative treatments, surgical interventions and non-pharmacological approaches for symptom control. I explained, in particular, the risks and ramifications of untreated moderate to severe OSA, especially with respect to developing cardiovascular disease down the road, including congestive heart failure (CHF), difficult to treat hypertension, cardiac arrhythmias (particularly A-fib), neurovascular complications including TIA, stroke and dementia. Even type 2 diabetes has, in part, been linked to untreated OSA. Symptoms of untreated OSA may include (but may not  be limited to) daytime sleepiness, nocturia (i.e. frequent nighttime urination), memory problems, mood irritability and suboptimally controlled or worsening mood disorder such as depression and/or anxiety, lack of energy, lack of motivation, physical discomfort, as well as recurrent headaches, especially morning or nocturnal headaches. We talked about the importance of maintaining a healthy lifestyle and striving for healthy weight.  The importance of ongoing complete smoking cessation was also addressed.  She was also advised to complete abstain from alcohol use.  In addition, we talked about the importance of striving for and maintaining good sleep hygiene. I  recommended a sleep study at this time. I outlined the differences between a laboratory attended sleep study which is considered more comprehensive and accurate over the option of a home sleep test (HST); the latter may lead to underestimation of sleep disordered breathing in some instances and does not help with diagnosing upper airway resistance syndrome and is not accurate enough to diagnose primary central sleep apnea typically. I outlined possible surgical and non-surgical treatment options of OSA, including the use of a positive airway pressure (PAP) device (i.e. CPAP, AutoPAP/APAP or BiPAP in certain circumstances), a custom-made dental device (aka oral appliance, which would require a referral to a specialist dentist or orthodontist typically, and is generally speaking not considered for patients with full dentures or edentulous state), upper airway surgical options, such as traditional UPPP (which is not considered a first-line treatment) or the Inspire device (hypoglossal nerve stimulator, which would involve a referral for consultation with an ENT surgeon, after careful selection, following inclusion criteria - also not first-line treatment). I explained the PAP treatment option to the patient in detail, as this is generally considered first-line  treatment.  The patient indicated that she would be willing to try PAP therapy, if the need arises. I explained the importance of being compliant with PAP treatment, not only for insurance purposes but primarily to improve patient's symptoms symptoms, and for the patient's long term health benefit, including to reduce Her cardiovascular risks longer-term.    We will pick up our discussion about the next steps and treatment options after testing.  We will keep her posted as to the test results by phone call and/or MyChart messaging where possible.  We will plan to follow-up in sleep clinic accordingly as well.  I answered all her questions today and the patient was in agreement.   I encouraged her to call with any interim questions, concerns, problems or updates or email us  through MyChart.  Generally speaking, sleep test authorizations may take up to 2 weeks, sometimes less, sometimes longer, the patient is encouraged to get in touch with us  if they do not hear back from the sleep lab staff directly within the next 2 weeks.  Thank you very much for allowing me to participate in the care of this nice patient. If I can be of any further assistance to you please do not hesitate to call me at [phone].  Sincerely,   Alicia Fairy, MD, PhD

## 2024-03-15 NOTE — Patient Instructions (Signed)

## 2024-03-16 ENCOUNTER — Telehealth: Payer: Self-pay | Admitting: Cardiology

## 2024-03-16 NOTE — Telephone Encounter (Signed)
*  STAT* If patient is at the pharmacy, call can be transferred to refill team.   1. Which medications need to be refilled? (please list name of each medication and dose if known) atorvastatin  (LIPITOR) 80 MG tablet    2. Would you like to learn more about the convenience, safety, & potential cost savings by using the [facility]?    3. Are you open to using the [facility] (Type [facility].  ).   4. Which pharmacy/location (including street and city if local pharmacy) is medication to be sent to? [facility] [address] [place], [place] - [address]    5. Do they need a 30 day or 90 day supply? 90 day

## 2024-03-17 ENCOUNTER — Telehealth: Payer: Self-pay | Admitting: Neurology

## 2024-03-17 MED ORDER — ATORVASTATIN CALCIUM 80 MG PO TABS: 80.0000 mg | ORAL_TABLET | Freq: Every day | ORAL | 3 refills | Status: AC

## 2024-03-17 NOTE — Telephone Encounter (Signed)
 [facility]PS[facility] ambetter pending & [facility] pending

## 2024-03-17 NOTE — Telephone Encounter (Signed)
 RX sent to requested Pharmacy

## 2024-03-24 ENCOUNTER — Encounter (HOSPITAL_COMMUNITY): Payer: Self-pay | Admitting: Student

## 2024-03-24 ENCOUNTER — Ambulatory Visit (INDEPENDENT_AMBULATORY_CARE_PROVIDER_SITE_OTHER): Admitting: Student

## 2024-03-24 DIAGNOSIS — F4310 Post-traumatic stress disorder, unspecified: Secondary | ICD-10-CM | POA: Diagnosis not present

## 2024-03-24 DIAGNOSIS — F411 Generalized anxiety disorder: Secondary | ICD-10-CM

## 2024-03-24 DIAGNOSIS — R400 Somnolence: Secondary | ICD-10-CM | POA: Diagnosis not present

## 2024-03-24 DIAGNOSIS — R0683 Snoring: Secondary | ICD-10-CM

## 2024-03-24 DIAGNOSIS — F332 Major depressive disorder, recurrent severe without psychotic features: Secondary | ICD-10-CM

## 2024-03-24 MED ORDER — LAMOTRIGINE 100 MG PO TABS: 100.0000 mg | ORAL_TABLET | Freq: Every evening | ORAL | 0 refills | Status: DC

## 2024-03-24 MED ORDER — VENLAFAXINE HCL ER 75 MG PO CP24: 225.0000 mg | ORAL_CAPSULE | Freq: Every day | ORAL | 0 refills | Status: AC

## 2024-03-24 MED ORDER — CLONIDINE HCL 0.1 MG PO TABS: 0.1000 mg | ORAL_TABLET | Freq: Every evening | ORAL | 0 refills | Status: DC
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
## 2024-03-29 NOTE — Telephone Encounter (Signed)
 NPSG [employer] denied the NPSG due to we are out of network- [employer] approved the NPSG auth: [id number] (exp. 03/17/24 to 04/16/24)

## 2024-03-31 ENCOUNTER — Telehealth (HOSPITAL_COMMUNITY): Payer: Self-pay

## 2024-03-31 NOTE — Telephone Encounter (Signed)
 Office referral recv'ed for CR. Will pass to RN for review, not sure if pt had new heart event since 09/2023.

## 2024-04-07 ENCOUNTER — Encounter (HOSPITAL_COMMUNITY): Payer: MEDICAID | Admitting: Student

## 2024-04-12 ENCOUNTER — Ambulatory Visit (HOSPITAL_COMMUNITY): Payer: MEDICAID | Admitting: Licensed Clinical Social Worker

## 2024-04-18 NOTE — Telephone Encounter (Signed)
 Started new authorization for the SS.

## 2024-04-20 NOTE — Telephone Encounter (Signed)
 Updated new auth:  NPSG MCD Jarrell barrows: [id number] (exp. 05/01/24 to 05/31/24)   Patient is scheduled at [facility] For 05/01/24 at 9 pm.

## 2024-04-28 ENCOUNTER — Telehealth: Payer: Self-pay | Admitting: Neurology

## 2024-04-28 NOTE — Telephone Encounter (Signed)
 Pt called to cx appointment for 7-6

## 2024-05-01 ENCOUNTER — Encounter

## 2024-05-05 ENCOUNTER — Telehealth: Payer: Self-pay

## 2024-05-05 NOTE — Telephone Encounter (Signed)
 LVM for patient to reschedule NPSG for 05/01/24

## 2024-06-01 ENCOUNTER — Telehealth: Payer: Self-pay | Admitting: Pharmacy Technician

## 2024-06-01 ENCOUNTER — Telehealth: Payer: Self-pay | Admitting: Cardiology

## 2024-06-01 MED ORDER — TICAGRELOR 90 MG PO TABS: 90.0000 mg | ORAL_TABLET | Freq: Two times a day (BID) | ORAL | 2 refills | Status: DC

## 2024-06-01 NOTE — Telephone Encounter (Signed)
*  STAT* If patient is at the pharmacy, call can be transferred to refill team.   1. Which medications need to be refilled? (please list name of each medication and dose if known) ticagrelor  (BRILINTA ) 90 MG TABS tablet    4. Which pharmacy/location (including street and city if local pharmacy) is medication to be sent to?     [id number] [place], [place] - [address] AT [facility] [address] [facility] [address] [id number] [address] [place]  [id number]       5. Do they need a 30 day or 90 day supply? 90

## 2024-06-01 NOTE — Telephone Encounter (Signed)
   Insurance said to use brand-brilinta .I called her pharmacy and asked them to run for brand. Brand went through ins

## 2024-06-01 NOTE — Telephone Encounter (Signed)
 RX sent in

## 2024-06-22 ENCOUNTER — Other Ambulatory Visit (HOSPITAL_COMMUNITY): Payer: Self-pay

## 2024-09-07 ENCOUNTER — Ambulatory Visit: Payer: Self-pay | Admitting: Cardiology

## 2024-09-07 ENCOUNTER — Encounter: Payer: Self-pay | Admitting: Cardiology

## 2024-09-07 ENCOUNTER — Ambulatory Visit: Payer: MEDICAID | Attending: Cardiology | Admitting: Cardiology

## 2024-09-07 VITALS — BP 120/88 | HR 61 | Ht 64.0 in | Wt 227.0 lb

## 2024-09-07 DIAGNOSIS — R0609 Other forms of dyspnea: Secondary | ICD-10-CM | POA: Diagnosis present

## 2024-09-07 DIAGNOSIS — E785 Hyperlipidemia, unspecified: Secondary | ICD-10-CM | POA: Diagnosis present

## 2024-09-07 DIAGNOSIS — I25119 Atherosclerotic heart disease of native coronary artery with unspecified angina pectoris: Secondary | ICD-10-CM

## 2024-09-07 DIAGNOSIS — Z955 Presence of coronary angioplasty implant and graft: Secondary | ICD-10-CM

## 2024-09-07 DIAGNOSIS — F411 Generalized anxiety disorder: Secondary | ICD-10-CM

## 2024-09-07 DIAGNOSIS — I2119 ST elevation (STEMI) myocardial infarction involving other coronary artery of inferior wall: Secondary | ICD-10-CM | POA: Diagnosis not present

## 2024-09-07 DIAGNOSIS — I10 Essential (primary) hypertension: Secondary | ICD-10-CM

## 2024-09-07 DIAGNOSIS — R400 Somnolence: Secondary | ICD-10-CM

## 2024-09-07 DIAGNOSIS — R600 Localized edema: Secondary | ICD-10-CM

## 2024-09-07 MED ORDER — CLOPIDOGREL BISULFATE 75 MG PO TABS: 75.0000 mg | ORAL_TABLET | Freq: Every day | ORAL | 3 refills | Status: AC

## 2024-09-07 NOTE — Patient Instructions (Addendum)
 Medication Instructions:   STOP TAKING BRILITINA  AFTER CURRENT BOTTLE   START TAKING CLOPIDOGREL ( PLAVIX) 75 MG DAILY   *If you need a refill on your cardiac medications before your next appointment, please call your pharmacy*   Lab Work: CMP LIPIDS CBC If you have labs (blood work) drawn today and your tests are completely normal, you will receive your results only by: MyChart Message (if you have MyChart) OR A paper copy in the mail If you have any lab test that is abnormal or we need to change your treatment, we will call you to review the results.   Testing/Procedures: NOT NEEDED   Follow-Up: At [facility], you and your health needs are our priority.  As part of our continuing mission to provide you with exceptional heart care, we have created designated Provider Care Teams.  These Care Teams include your primary Cardiologist (physician) and Advanced Practice Providers (APPs -  Physician Assistants and Nurse Practitioners) who all work together to provide you with the care you need, when you need it.     Your next appointment:   4 month(s) ( LATE MARCH 2026)  The format for your next appointment:   In Person  Provider:   Alm Clay, MD   Other Instructions

## 2024-09-07 NOTE — Assessment & Plan Note (Signed)
 Shortness of breath on exertion, likely due to weight gain and anxiety. No orthopnea or paroxysmal nocturnal dyspnea. - Monitor symptoms and reassess if worsens. - Consider echocardiogram if symptoms persist.

## 2024-09-07 NOTE — Assessment & Plan Note (Signed)
 1 year out from STEMI with single-vessel PCI.  Already stopped aspirin  at the 1 year point in October.  Plan will be now to switch from Brilinta  90 mg twice daily to Plavix and 5 mg daily.  Okay to hold Plavix 5 to 7 days preop for surgeries or procedures.

## 2024-09-07 NOTE — Progress Notes (Signed)
 Cardiology Office Note:  .   Date:  09/07/2024  ID:  Alicia Rosario, DOB [birth year], MRN [id number] PCP: Alicia Rosario [facility] Providers Cardiologist:  Alm Clay, MD     Chief Complaint  Patient presents with   Follow-up    81-month follow-up.  1 year post MI   Coronary Artery Disease    No angina.  1 year out from MI.    Patient Profile: .     Alicia Rosario is a moderate to severely obese 43 y.o. female  with a PMH notable for CAD-inferior STEMI with LCx PCI, HTN and HLD who presents here for delayed 75-month follow-up at the request of Alicia Alfonso, PA-C.  Inferior STEMI 08/23/2023: Distal LCx 100% (DES PCI), 40% mid RCA.      Alicia Rosario was last seen on January 11, 2024: Doing well with no recurrent angina.  Had some bruising.  Plan was to discontinue aspirin  as of April and continue Brilinta  until 1 year post MI and then convert to Plavix for monotherapy.  Lipid panel ordered but not checked.  Subjective  Discussed the use of AI scribe software for clinical note transcription with the patient, who gave verbal consent to proceed.  History of Present Illness Alicia Rosario is a 43 year old female with coronary artery disease who presents for follow-up and blood work.  She is fasting today for her cholesterol blood test. It has been one year since her myocardial infarction. She has not experienced recent episodes of chest pain, pressure, or tightness.  She experiences ongoing dyspnea, particularly when bending down or walking, which she describes as 'an ongoing thing.' Her walking pace has slowed to avoid losing her breath. There is no chest pain associated with the dyspnea, and she does not experience orthopnea or paroxysmal nocturnal dyspnea.  She has insomnia, which she attributes to anxiety related to her current life circumstances, including a divorce. She has a history of depression and PTSD and is working with a paramedic and  psychiatrist to manage her mental health. She is in the process of determining whether her condition is major depressive disorder or bipolar disorder. Her current medications for mental health include Effexor , which is being tapered down, Lamictal , and Caplyta.  She reports edema in her legs, particularly around her ankles, which she notices due to the imprints left by her socks. She is on several medications for her blood pressure with doses increased by PCP: Including losartan  100 mg, Toprol  100 mg, and clonidine  0.1 mg twice a day. She is also taking atorvastatin  80 mg for cholesterol management.  She is still taking Brilinta  90 mg twice daily but had stopped her aspirin  at the end of October, based on her last discussion  She has a history of weight gain, which she attributes to her mental health struggles and lack of physical activity. She is working with her primary care physician to manage her weight, including keeping a food log.  Cardiovascular ROS: positive for - dyspnea on exertion, edema, and weight gain.  Some baseline dyspnea negative for - chest pain, irregular heartbeat, orthopnea, palpitations, paroxysmal nocturnal dyspnea, rapid heart rate, shortness of breath, or syncope or near syncope TIA or amaurosis fugax.  ROS:  Review of Systems - Negative except symptoms noted above.  Major issues are related to her psychiatric concerns gone through divorce.    Objective   In addition to her BP/lipid meds noted above she is also taking Lamictal   1000 mg daily (nightly), Protonix  40 mg daily  Studies Reviewed: SABRA   EKG Interpretation Date/Time:  Wednesday September 07 2024 09:14:06 EST Ventricular Rate:  61 PR Interval:  156 QRS Duration:  94 QT Interval:  406 QTC Calculation: 408 R Axis:   -16  Text Interpretation: Normal sinus rhythm Minimal voltage criteria for LVH, may be normal variant ( R in aVL ) ST elevation, consider early repolarization, pericarditis, or injury Poor R wave  progression When compared with ECG of 14-Oct-2023 17:04, Vent. rate has decreased BY  38 BPM Minimal criteria for Anterior infarct are no longer Present Borderline criteria for Inferior infarct are no longer Present Confirmed by Anner Lenis ([id number]) on 09/07/2024 3:35:33 PM    Lab Results  Component Value Date   CHOL 131 08/24/2023   HDL 32 (L) 08/24/2023   LDLCALC 86 08/24/2023   TRIG 63 08/24/2023   CHOLHDL 4.1 08/24/2023   Lab Results  Component Value Date   NA 135 10/15/2023   K 3.4 (L) 10/15/2023   CREATININE 0.71 10/15/2023   GFRNONAA >60 10/15/2023   GLUCOSE 91 10/15/2023   No results found for: HGBA1C  CATH-PCI (08/23/2023): Inferior STEMI- dLCx 99% (Onyx frontier DES 2.5 x 18 mm postdilated to 2.6 mm.) mid RCA 45%.  Normal EF. Diagnostic: Dominance: Co-dominant                                                                       Intervention  ECHO (08/16/2023): (Post inferior STEMI) EF 50 to 55%.  No RWMA.  Normal valves.  Normal pressures.  Risk Assessment/Calculations:              Physical Exam:   VS:  BP 120/88 (BP Location: Left Arm, Patient Position: Sitting)   Pulse 61   Ht 5' 4 (1.626 m)   Wt 227 lb (103 kg)   SpO2 99%   BMI 38.96 kg/m    Wt Readings from Last 3 Encounters:  09/07/24 227 lb (103 kg)  03/15/24 218 lb (98.9 kg)  01/11/24 218 lb (98.9 kg)      GEN: Well nourished, well developed in no acute distress; Obese NECK: No JVD; No carotid bruits CARDIAC: Normal S1, S2; RRR, no murmurs, rubs, gallops RESPIRATORY:  Clear to auscultation without rales, wheezing or rhonchi ; nonlabored, good air movement. ABDOMEN: Soft, non-tender, non-distended EXTREMITIES:  trivial edema; No deformity      ASSESSMENT AND PLAN: .    Problem List Items Addressed This Visit       Cardiology Problems   Coronary artery disease involving native coronary artery of native heart with angina pectoris (Chronic)   One year post-STEMI and PCI with no  recurrence of symptoms. Transitioned from Brilinta  to Plavix due to cost and potency. Shortness of breath likely related to weight gain and anxiety. - Switched from Brilinta  to Plavix 75 mg daily. - Continue current increased doses of losartan  and Toprol  (both at 100 mg daily) along with clonidine  0.1 mg twice daily for BP control and afterload reduction. - Continue atorvastatin  80 mg daily but - Ordered blood work for cholesterol. - Scheduled follow-up in 4-5 months.      Relevant Medications   metoprolol  succinate (TOPROL -XL) 100 MG  24 hr tablet   losartan  (COZAAR ) 100 MG tablet   cloNIDine  (CATAPRES ) 0.1 MG tablet   Other Relevant Orders   EKG 12-Lead (Completed)   Lipid panel   Comprehensive metabolic panel with GFR   CBC   Essential (primary) hypertension (Chronic)   Her PCP had increased her losartan  and Toprol  up to the 100 mg daily each.  She has also converted her clonidine  from 0.1 mg daily to 01 mg twice daily  BP looks  well-controlled. Mild peripheral edema likely due to antihypertensive medications. - Continue current antihypertensive regimen.      Relevant Medications   metoprolol  succinate (TOPROL -XL) 100 MG 24 hr tablet   losartan  (COZAAR ) 100 MG tablet   cloNIDine  (CATAPRES ) 0.1 MG tablet   Other Relevant Orders   EKG 12-Lead (Completed)   Comprehensive metabolic panel with GFR   CBC   H/o STEMI involving oth coronary artery of inferior wall (HCC) - Primary (Chronic)   1 year out from STEMI.  No recurrent symptoms. Preserved EF on echo and no other significant disease. On stable regimen.      Relevant Medications   metoprolol  succinate (TOPROL -XL) 100 MG 24 hr tablet   losartan  (COZAAR ) 100 MG tablet   cloNIDine  (CATAPRES ) 0.1 MG tablet   Other Relevant Orders   EKG 12-Lead (Completed)   Hyperlipidemia with target low density lipoprotein (LDL) cholesterol less than 55 mg/dL (Chronic)   Cholesterol levels not recently checked. On atorvastatin  80 mg. -  Ordered blood work for cholesterol levels. - Continue atorvastatin  80 mg daily      Relevant Medications   metoprolol  succinate (TOPROL -XL) 100 MG 24 hr tablet   losartan  (COZAAR ) 100 MG tablet   cloNIDine  (CATAPRES ) 0.1 MG tablet   Other Relevant Orders   EKG 12-Lead (Completed)   Lipid panel   Comprehensive metabolic panel with GFR     Other   Daytime somnolence (Chronic)   She says that she really has issues more with insomnia then concerns for sleep apnea.  More related to her anxiety and PTSD.      Relevant Orders   EKG 12-Lead (Completed)   Exertional dyspnea (Chronic)   Shortness of breath on exertion, likely due to weight gain and anxiety. No orthopnea or paroxysmal nocturnal dyspnea. - Monitor symptoms and reassess if worsens. - Consider echocardiogram if symptoms persist.      GAD (generalized anxiety disorder) (Chronic)   Anxiety and depression exacerbated by divorce. Under psychiatric care with therapy and medication adjustments. Effexor  tapered, Caplyta added. - Continue psychiatric care and medication adjustments. - Monitor mood and anxiety symptoms.      Mild peripheral edema (Chronic)   I suspect that she may have some mild edema due to venous stasis pooling being on blood pressure medications.  Usually goes down with that elevation.  No associated orthopnea or PND.  Likely not likely be cardiac in nature.      Relevant Orders   EKG 12-Lead (Completed)   Comprehensive metabolic panel with GFR   CBC   Presence of drug coated stent in left circumflex coronary artery (Chronic)   1 year out from STEMI with single-vessel PCI.  Already stopped aspirin  at the 1 year point in October.  Plan will be now to switch from Brilinta  90 mg twice daily to Plavix and 5 mg daily.  Okay to hold Plavix 5 to 7 days preop for surgeries or procedures.      Relevant Orders   EKG 12-Lead (Completed)  Follow-Up: Return in about 4 months (around 01/05/2025) for  [employer] => to reassess dyspnea..  I spent 37 minutes in the care of Alicia Rosario today including reviewing labs (1 minute), reviewing studies (3 minutes reviewing echo and catheter PCI), face to face time discussing treatment options (24 minutes), reviewing records from most recent PCCM note and my last note.  (2 minutes), 7 minutes dictating, and documenting in the encounter.      Signed, Alm MICAEL Clay, MD, MS Alm Clay, M.D., M.S. Interventional Cardiologist  [facility] Pager # [phone]

## 2024-09-07 NOTE — Assessment & Plan Note (Addendum)
 Cholesterol levels not recently checked. On atorvastatin  80 mg. - Ordered blood work for cholesterol levels. - Continue atorvastatin  80 mg daily

## 2024-09-07 NOTE — Assessment & Plan Note (Signed)
 1 year out from STEMI.  No recurrent symptoms. Preserved EF on echo and no other significant disease. On stable regimen.

## 2024-09-07 NOTE — Assessment & Plan Note (Signed)
 She says that she really has issues more with insomnia then concerns for sleep apnea.  More related to her anxiety and PTSD.

## 2024-09-07 NOTE — Assessment & Plan Note (Signed)
 Her PCP had increased her losartan  and Toprol  up to the 100 mg daily each.  She has also converted her clonidine  from 0.1 mg daily to 01 mg twice daily  BP looks  well-controlled. Mild peripheral edema likely due to antihypertensive medications. - Continue current antihypertensive regimen.

## 2024-09-07 NOTE — Assessment & Plan Note (Signed)
 I suspect that she may have some mild edema due to venous stasis pooling being on blood pressure medications.  Usually goes down with that elevation.  No associated orthopnea or PND.  Likely not likely be cardiac in nature.

## 2024-09-07 NOTE — Assessment & Plan Note (Signed)
 Anxiety and depression exacerbated by divorce. Under psychiatric care with therapy and medication adjustments. Effexor  tapered, Caplyta added. - Continue psychiatric care and medication adjustments. - Monitor mood and anxiety symptoms.

## 2024-09-07 NOTE — Assessment & Plan Note (Signed)
 One year post-STEMI and PCI with no recurrence of symptoms. Transitioned from Brilinta  to Plavix due to cost and potency. Shortness of breath likely related to weight gain and anxiety. - Switched from Brilinta  to Plavix 75 mg daily. - Continue current increased doses of losartan  and Toprol  (both at 100 mg daily) along with clonidine  0.1 mg twice daily for BP control and afterload reduction. - Continue atorvastatin  80 mg daily but - Ordered blood work for cholesterol. - Scheduled follow-up in 4-5 months.

## 2024-09-09 LAB — CBC

## 2024-09-10 LAB — COMPREHENSIVE METABOLIC PANEL WITH GFR
ALT: 16 IU/L (ref 0–32)
AST: 14 IU/L (ref 0–40)
Albumin: 4.2 g/dL (ref 3.9–4.9)
Alkaline Phosphatase: 89 IU/L (ref 41–116)
BUN/Creatinine Ratio: 8 — ABNORMAL LOW (ref 9–23)
BUN: 6 mg/dL (ref 6–24)
Bilirubin Total: 0.5 mg/dL (ref 0.0–1.2)
CO2: 25 mmol/L (ref 20–29)
Calcium: 9.3 mg/dL (ref 8.7–10.2)
Chloride: 103 mmol/L (ref 96–106)
Creatinine, Ser: 0.72 mg/dL (ref 0.57–1.00)
Globulin, Total: 2.6 g/dL (ref 1.5–4.5)
Glucose: 90 mg/dL (ref 70–99)
Potassium: 4.5 mmol/L (ref 3.5–5.2)
Sodium: 141 mmol/L (ref 134–144)
Total Protein: 6.8 g/dL (ref 6.0–8.5)
eGFR: 108 mL/min/1.73 (ref >59)

## 2024-09-10 LAB — LIPID PANEL
Chol/HDL Ratio: 3.2 ratio (ref 0.0–4.4)
Cholesterol, Total: 115 mg/dL (ref 100–199)
HDL: 36 mg/dL — ABNORMAL LOW (ref >39)
LDL Chol Calc (NIH): 65 mg/dL (ref 0–99)
Triglycerides: 63 mg/dL (ref 0–149)
VLDL Cholesterol Cal: 14 mg/dL (ref 5–40)

## 2024-09-10 LAB — CBC
Hematocrit: 41.3 % (ref 34.0–46.6)
Hemoglobin: 13.4 g/dL (ref 11.1–15.9)
MCH: 25.7 pg — ABNORMAL LOW (ref 26.6–33.0)
MCHC: 32.4 g/dL (ref 31.5–35.7)
MCV: 79 fL (ref 79–97)
Platelets: 268 x10E3/uL (ref 150–450)
RBC: 5.21 x10E6/uL (ref 3.77–5.28)
RDW: 13.7 % (ref 11.7–15.4)
WBC: 7.4 x10E3/uL (ref 3.4–10.8)

## 2024-09-13 ENCOUNTER — Ambulatory Visit: Payer: Self-pay | Admitting: Cardiology

## 2025-01-06 ENCOUNTER — Ambulatory Visit: Payer: MEDICAID | Admitting: Cardiology
